# Patient Record
Sex: Male | Born: 1965
Health system: Southern US, Community
[De-identification: ages and names within clinical notes are randomized; demographics above are authoritative.]

## PROBLEM LIST (undated history)

## (undated) DIAGNOSIS — E669 Obesity, unspecified: Secondary | ICD-10-CM

## (undated) DIAGNOSIS — D369 Benign neoplasm, unspecified site: Secondary | ICD-10-CM

## (undated) DIAGNOSIS — K760 Fatty (change of) liver, not elsewhere classified: Secondary | ICD-10-CM

## (undated) DIAGNOSIS — G4733 Obstructive sleep apnea (adult) (pediatric): Secondary | ICD-10-CM

## (undated) DIAGNOSIS — N200 Calculus of kidney: Secondary | ICD-10-CM

## (undated) DIAGNOSIS — K449 Diaphragmatic hernia without obstruction or gangrene: Secondary | ICD-10-CM

## (undated) DIAGNOSIS — R011 Cardiac murmur, unspecified: Secondary | ICD-10-CM

## (undated) DIAGNOSIS — E785 Hyperlipidemia, unspecified: Secondary | ICD-10-CM

## (undated) DIAGNOSIS — U071 COVID-19: Secondary | ICD-10-CM

## (undated) DIAGNOSIS — I2510 Atherosclerotic heart disease of native coronary artery without angina pectoris: Secondary | ICD-10-CM

## (undated) DIAGNOSIS — N289 Disorder of kidney and ureter, unspecified: Secondary | ICD-10-CM

## (undated) DIAGNOSIS — I10 Essential (primary) hypertension: Secondary | ICD-10-CM

## (undated) DIAGNOSIS — M67919 Unspecified disorder of synovium and tendon, unspecified shoulder: Secondary | ICD-10-CM

## (undated) DIAGNOSIS — J45909 Unspecified asthma, uncomplicated: Secondary | ICD-10-CM

## (undated) DIAGNOSIS — R7989 Other specified abnormal findings of blood chemistry: Secondary | ICD-10-CM

## (undated) HISTORY — DX: Atherosclerotic heart disease of native coronary artery without angina pectoris: I25.10

## (undated) HISTORY — DX: Other specified abnormal findings of blood chemistry: R79.89

## (undated) HISTORY — DX: Calculus of kidney: N20.0

## (undated) HISTORY — DX: Diaphragmatic hernia without obstruction or gangrene: K44.9

## (undated) HISTORY — DX: Fatty (change of) liver, not elsewhere classified: K76.0

## (undated) HISTORY — DX: Unspecified asthma, uncomplicated: J45.909

## (undated) HISTORY — DX: Cardiac murmur, unspecified: R01.1

## (undated) HISTORY — DX: Hyperlipidemia, unspecified: E78.5

## (undated) HISTORY — DX: Unspecified disorder of synovium and tendon, unspecified shoulder: M67.919

## (undated) HISTORY — DX: Obesity, unspecified: E66.9

## (undated) HISTORY — DX: Obstructive sleep apnea (adult) (pediatric): G47.33

## (undated) HISTORY — DX: Benign neoplasm, unspecified site: D36.9

---
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
## 2000-05-30 ENCOUNTER — Other Ambulatory Visit: Admission: RE | Admit: 2000-05-30 | Discharge: 2000-05-30 | Payer: Self-pay | Admitting: Urology

## 2000-05-30 ENCOUNTER — Encounter (INDEPENDENT_AMBULATORY_CARE_PROVIDER_SITE_OTHER): Payer: Self-pay | Admitting: Specialist

## 2001-06-25 ENCOUNTER — Ambulatory Visit (HOSPITAL_COMMUNITY): Admission: RE | Admit: 2001-06-25 | Discharge: 2001-06-25 | Payer: Self-pay | Admitting: Gastroenterology

## 2001-07-03 ENCOUNTER — Encounter: Payer: Self-pay | Admitting: Gastroenterology

## 2001-07-03 ENCOUNTER — Ambulatory Visit (HOSPITAL_COMMUNITY): Admission: RE | Admit: 2001-07-03 | Discharge: 2001-07-03 | Payer: Self-pay | Admitting: Gastroenterology

## 2001-07-07 ENCOUNTER — Ambulatory Visit (HOSPITAL_COMMUNITY): Admission: RE | Admit: 2001-07-07 | Discharge: 2001-07-07 | Payer: Self-pay | Admitting: Gastroenterology

## 2001-07-07 ENCOUNTER — Encounter: Payer: Self-pay | Admitting: Gastroenterology

## 2001-08-29 ENCOUNTER — Ambulatory Visit (HOSPITAL_COMMUNITY): Admission: RE | Admit: 2001-08-29 | Discharge: 2001-08-29 | Payer: Self-pay | Admitting: Gastroenterology

## 2002-07-09 ENCOUNTER — Emergency Department (HOSPITAL_COMMUNITY): Admission: EM | Admit: 2002-07-09 | Discharge: 2002-07-10 | Payer: Self-pay

## 2002-10-07 ENCOUNTER — Ambulatory Visit (HOSPITAL_COMMUNITY): Admission: RE | Admit: 2002-10-07 | Discharge: 2002-10-07 | Payer: Self-pay | Admitting: Gastroenterology

## 2002-10-07 ENCOUNTER — Encounter (INDEPENDENT_AMBULATORY_CARE_PROVIDER_SITE_OTHER): Payer: Self-pay | Admitting: Specialist

## 2002-10-07 ENCOUNTER — Encounter: Payer: Self-pay | Admitting: Gastroenterology

## 2005-08-21 ENCOUNTER — Emergency Department (HOSPITAL_COMMUNITY): Admission: EM | Admit: 2005-08-21 | Discharge: 2005-08-22 | Payer: Self-pay | Admitting: Emergency Medicine

## 2009-06-30 ENCOUNTER — Emergency Department (HOSPITAL_COMMUNITY): Admission: EM | Admit: 2009-06-30 | Discharge: 2009-06-30 | Payer: Self-pay | Admitting: Emergency Medicine

## 2010-10-22 ENCOUNTER — Emergency Department (HOSPITAL_COMMUNITY)
Admission: EM | Admit: 2010-10-22 | Discharge: 2010-10-22 | Disposition: A | Discharge status: Home / Self Care | Payer: Commercial Managed Care - PPO | Attending: Emergency Medicine | Admitting: Emergency Medicine

## 2010-10-22 ENCOUNTER — Emergency Department (HOSPITAL_COMMUNITY): Payer: Commercial Managed Care - PPO

## 2010-10-22 DIAGNOSIS — W1809XA Striking against other object with subsequent fall, initial encounter: Secondary | ICD-10-CM | POA: Insufficient documentation

## 2010-10-22 DIAGNOSIS — S20219A Contusion of unspecified front wall of thorax, initial encounter: Secondary | ICD-10-CM | POA: Insufficient documentation

## 2010-10-22 DIAGNOSIS — Y92009 Unspecified place in unspecified non-institutional (private) residence as the place of occurrence of the external cause: Secondary | ICD-10-CM | POA: Insufficient documentation

## 2010-11-15 LAB — POCT RAPID STREP A (OFFICE): Streptococcus, Group A Screen (Direct): NEGATIVE

## 2010-12-29 NOTE — Procedures (Signed)
[facility]  Patient:    [name], [name] Visit Number: [id number] MRN: [id number]          Service Type: END Location: ENDO Attending Physician:  Louie Bun Dictated by:   Everardo All Madilyn Fireman, M.D. Proc. Date: 06/25/01 Admit Date:  06/25/2001   CC:         Jerl Santos, M.D.   Procedure Report  PROCEDURE:  Esophagogastroduodenoscopy.  ENDOSCOPIST:  Everardo All. Madilyn Fireman, M.D.  INDICATIONS:  Gastroesophageal reflux disease with some atypical features with failure to respond to Nexium over two weeks.  DESCRIPTION OF PROCEDURE:  The patient was placed in the left lateral decubitus position and placed on the pulse monitor with continuous low-flow oxygen delivered via nasal cannula.  He was sedated with 80 mg of IV Demerol and 8 mg IV Versed.  The Olympus video endoscope was advanced into the oropharynx and esophagus.  The esophagus was straight and of normal caliber with the squamocolumnar at 38 cm.  There was no visible hiatal hernia, ring, stricture, or other abnormality of the GE junction.  The stomach was entered and a small amount of liquid secretions were suctioned from the fundus. Retroflexed view of the cardia was unremarkable.  The fundus and body appeared normal.  The antrum showed two to three elevated areas of erythema with slight central dimpling and granularity consistent with focal moderate antral gastritis.  No ulcer or crater were seen.  The abdomen and pylorus was not significantly deformed.  The duodenum was entered and both bulb and second portion were well inspected and appeared to be within normal limits.  The scope was then withdrawn back into the stomach and a CLOtest obtained.  The scope was then withdrawn, and the patient returned to the recovery room in stable condition.  He tolerated the procedure well and there were no immediate complications.  IMPRESSION:  Antral gastritis, otherwise normal endoscopy.  PLAN:  Will await  CLOtest and consider gallbladder ultrasound. Dictated by:   Everardo All Madilyn Fireman, M.D. Attending Physician:  Louie Bun DD:  06/25/01 TD:  06/25/01 Job: [id number]

## 2010-12-29 NOTE — Procedures (Signed)
[facility]  Patient:    [name], BRANDER Visit Number: [id number] MRN: [id number]          Service Type: END Location: ENDO Attending Physician:  Louie Bun Dictated by:   Everardo All Madilyn Fireman, M.D. Proc. Date: 08/29/01 Admit Date:  08/29/2001 Discharge Date: 08/29/2001   CC:         Jerl Santos, M.D.   Procedure Report  PROCEDURE:  Colonoscopy.  INDICATION FOR PROCEDURE:  Family history of colon cancer in a first-degree relative in addition to ongoing abdominal pain of unclear etiology with negative response to empiric trials and negative work-up to date.  DESCRIPTION OF PROCEDURE:  The patient was placed in the left lateral decubitus position and placed on the pulse monitor with continuous low-flow oxygen delivered by nasal cannula.  He was sedated with 80 mg of IV Demerol and 8 mg of IV Versed.  The Olympus video colonoscope was inserted into the rectum and advanced to the cecum, confirmed by visualization of the ileocecal valve and transillumination at McBurneys point. However, despite multiple torquing maneuvers, abdominal pressure, and position changes, I was unable to advance the scope beyond the ileocecal valve, and I could not see all of the base of the cecum and, in other areas, I was only able to see in a limited fashion.  I could thus not rule out deep lesions in the cecum.  Other than that, the visualized parts and portions of the cecum as well as the ascending, transverse, descending, and sigmoid colon as well as the rectum appeared normal with no masses, polyps, diverticula, or other mucosal abnormalities. The scope was then withdrawn, and the patient returned to the recovery room in stable condition.  He tolerated the procedure well, and there were no immediate complications.  IMPRESSION:  Normal colonoscopy with a limited view of the cecum.  PLAN:  Will continue Nexium for now which seems to be helping some of  his gastrointestinal symptoms.  Follow-up colonoscopy in five years. Dictated by:   Everardo All Madilyn Fireman, M.D. Attending Physician:  Louie Bun DD:  08/29/01 TD:  08/31/01 Job: [id number]

## 2011-01-04 ENCOUNTER — Observation Stay (HOSPITAL_COMMUNITY)
Admission: EM | Admit: 2011-01-04 | Discharge: 2011-01-05 | Disposition: A | Discharge status: Home / Self Care | Payer: 59 | Source: Ambulatory Visit | Attending: Internal Medicine | Admitting: Internal Medicine

## 2011-01-04 ENCOUNTER — Emergency Department (HOSPITAL_COMMUNITY): Payer: 59

## 2011-01-04 DIAGNOSIS — E785 Hyperlipidemia, unspecified: Secondary | ICD-10-CM | POA: Insufficient documentation

## 2011-01-04 DIAGNOSIS — E669 Obesity, unspecified: Secondary | ICD-10-CM | POA: Insufficient documentation

## 2011-01-04 DIAGNOSIS — K219 Gastro-esophageal reflux disease without esophagitis: Secondary | ICD-10-CM | POA: Insufficient documentation

## 2011-01-04 DIAGNOSIS — I10 Essential (primary) hypertension: Secondary | ICD-10-CM | POA: Insufficient documentation

## 2011-01-04 DIAGNOSIS — R079 Chest pain, unspecified: Principal | ICD-10-CM | POA: Insufficient documentation

## 2011-01-04 LAB — COMPREHENSIVE METABOLIC PANEL
ALT: 53 U/L (ref 0–53)
Calcium: 9.5 mg/dL (ref 8.4–10.5)
Creatinine, Ser: 0.93 mg/dL (ref 0.4–1.5)
Glucose, Bld: 105 mg/dL — ABNORMAL HIGH (ref 70–99)
Sodium: 136 mEq/L (ref 135–145)
Total Protein: 7.4 g/dL (ref 6.0–8.3)

## 2011-01-04 LAB — CK TOTAL AND CKMB (NOT AT ARMC)
CK, MB: 4.2 ng/mL — ABNORMAL HIGH (ref 0.3–4.0)
Relative Index: 1.1 (ref 0.0–2.5)
Total CK: 384 U/L — ABNORMAL HIGH (ref 7–232)

## 2011-01-04 LAB — CARDIAC PANEL(CRET KIN+CKTOT+MB+TROPI)
CK, MB: 3.8 ng/mL (ref 0.3–4.0)
CK, MB: 3.5 ng/mL (ref 0.3–4.0)
Relative Index: 1 (ref 0.0–2.5)
Total CK: 334 U/L — ABNORMAL HIGH (ref 7–232)
Troponin I: <0.3 ng/mL (ref <0.30)

## 2011-01-04 LAB — CBC
HCT: 44.8 % (ref 39.0–52.0)
Hemoglobin: 16 g/dL (ref 13.0–17.0)
RDW: 13.4 % (ref 11.5–15.5)
WBC: 6.1 10*3/uL (ref 4.0–10.5)

## 2011-01-04 LAB — POCT CARDIAC MARKERS
CKMB, poc: 2.3 ng/mL (ref 1.0–8.0)
Myoglobin, poc: 120 ng/mL (ref 12–200)

## 2011-01-04 LAB — RAPID URINE DRUG SCREEN, HOSP PERFORMED
Cocaine: NOT DETECTED
Tetrahydrocannabinol: NOT DETECTED

## 2011-01-05 ENCOUNTER — Observation Stay (HOSPITAL_COMMUNITY): Payer: 59

## 2011-01-05 LAB — HEMOGLOBIN A1C
Hgb A1c MFr Bld: 5.5 % (ref <5.7)
Mean Plasma Glucose: 111 mg/dL (ref <117)

## 2011-01-05 LAB — CARDIAC PANEL(CRET KIN+CKTOT+MB+TROPI)
CK, MB: 3.1 ng/mL (ref 0.3–4.0)
Relative Index: 1.1 (ref 0.0–2.5)
Total CK: 278 U/L — ABNORMAL HIGH (ref 7–232)
Troponin I: <0.3 ng/mL (ref <0.30)

## 2011-01-05 LAB — BASIC METABOLIC PANEL
Calcium: 8.8 mg/dL (ref 8.4–10.5)
GFR calc non Af Amer: >60 mL/min (ref >60)
Glucose, Bld: 104 mg/dL — ABNORMAL HIGH (ref 70–99)
Sodium: 136 mEq/L (ref 135–145)

## 2011-01-05 LAB — CBC
HCT: 41.7 % (ref 39.0–52.0)
MCHC: 35 g/dL (ref 30.0–36.0)
Platelets: 152 10*3/uL (ref 150–400)
RDW: 13.2 % (ref 11.5–15.5)

## 2011-01-05 LAB — LIPID PANEL: Triglycerides: 159 mg/dL — ABNORMAL HIGH (ref <150)

## 2011-01-05 MED ORDER — TECHNETIUM TC 99M TETROFOSMIN IV KIT
10.0000 | PACK | Freq: Once | INTRAVENOUS | Status: AC | PRN
  Administered 2011-01-05: 10 via INTRAVENOUS

## 2011-01-05 MED ORDER — TECHNETIUM TC 99M TETROFOSMIN IV KIT
30.0000 | PACK | Freq: Once | INTRAVENOUS | Status: AC | PRN
  Administered 2011-01-05: 30 via INTRAVENOUS

## 2011-01-05 NOTE — Consult Note (Signed)
NAME:  [name], [name]                 ACCOUNT NO.:  [id number]  MEDICAL RECORD NO.:  [id number]           PATIENT TYPE:  O  LOCATION:  2009                         FACILITY:  [facility]  PHYSICIAN:  Lyn Records, M.D.   DATE OF BIRTH:  [birth year]  DATE OF CONSULTATION:  01/04/2011 DATE OF DISCHARGE:                                CONSULTATION   INDICATION FOR ADMISSION:  Prolonged chest pain.  CONCLUSIONS: 1. Prolonged chest pain with some typical features.  Rule out     myocardial infarction versus unstable angina.  Rule out GI. 2. Hypertension. 3. Hyperlipidemia. 4. Gastroesophageal reflux.  RECOMMENDATIONS: 1. Serial markers. 2. Lipid panel. 3. EKG in a.m. 4. Can versus outpatient stress nuclear study assuming markers and EKG     is remain normal. 5. Lovenox until the patient rules out.  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  The discomfort did not radiate and there was no shortness of breath or diaphoresis. The discomfort was severe enough that he came to the emergency room where a GI cocktail and two nitroglycerin tablets were given.  After combined duration of approximately 2 hours, the discomfort resolved and has not recurred.  The patient is relatively active and has never experienced this type of discomfort before.  He feels that this was a severe episode of heartburn.  He remains relatively active without limiting symptoms prior to this admission.  His significant medical problems include gastroesophageal reflux, hypertension, and elevated, but untreated lipids that are "not that bad."  FAMILY HISTORY:  Significant for father died at age 92 of heart failure from previous infarcts.  His first MI was in his mid-to-late 30s.  His mother is living.  HABITS:  He does not smoke.  He drinks beer usually on  weekends.  MEDICATIONS:  Nexium 40 mg daily at bedtime.  ALLERGIES:  PENICILLIN.  PHYSICAL EXAMINATION:  GENERAL:  The patient is obese.  He is in no distress. VITAL SIGNS:  Blood pressure is 130/70, heart rate is 60. NECK:  No carotid bruits or JVD is noted with the patient sitting in 90 degrees. LUNGS:  Clear to auscultation and percussion. CARDIAC:  Normal.  No rub, no click, no gallop is heard. ABDOMEN:  Soft.  The liver and spleen are not enlarged. EXTREMITIES:  Reveal no edema.  Pulses are 2+ and symmetric in upper and lower extremities. NEUROLOGIC:  Grossly intact.  EKG is normal.  Laboratory data is all normal.  Chest x-ray is unremarkable.  First set of cardiac markers revealed a CK-MB of 4.2. Second set is normal.  Troponins have been normal.  DISCUSSION:  The patient's history has a qualitative feature suggestive of ischemia, although there is no objective evidence to this point.  He should be on antithrombotic therapy.  He should be rule out with three sets of markers.  Lipid panel should also be obtained.  Despite his markers remain negative and EKGs did not reveal evidence of ischemia, a stress nuclear  study should be considered either inpatient or outpatient depending upon which can be done most expediently.     Lyn Records, M.D.     HWS/MEDQ  D:  01/04/2011  T:  01/05/2011  Job:  [id number]  cc:   Thora Lance, M.D.  Electronically Signed by Verdis Prime M.D. on 01/05/2011 01:39:17 PM

## 2011-01-06 ENCOUNTER — Other Ambulatory Visit (HOSPITAL_COMMUNITY): Payer: 59

## 2011-01-18 NOTE — Discharge Summary (Signed)
  NAME:  [name], [name]                 ACCOUNT NO.:  [id number]  MEDICAL RECORD NO.:  [id number]           PATIENT TYPE:  O  LOCATION:  2009                         FACILITY:  [facility]  PHYSICIAN:  Thora Lance, M.D.  DATE OF BIRTH:  Feb 03, 1966  DATE OF ADMISSION:  01/04/2011 DATE OF DISCHARGE:  01/05/2011                              DISCHARGE SUMMARY   REASON FOR ADMISSION:  This is a 45 year old white male with a history of hypertension and reflux who presented with substernal squeezing chest pain on day of admission.  This was nonradiating.  The patient did get nitroglycerin at the ER and the pain resolved.  Strong family history of coronary artery disease.  SIGNIFICANT FINDINGS:  Blood pressure 128/85, heart rate 57, respirations 14, temperature 97.3.  Lungs were clear.  Heart regular rate and rhythm without murmur, gallop or rub.  Abdomen benign.  LABORATORY:  Sodium 136, potassium 3.7, chloride 99, bicarbonate 25, BUN 15, creatinine 0.93, glucose 105.  WBC 6.1, hemoglobin 16.0, platelets 102.  Chest x-ray, no acute disease.  Point of care markers negative. EKG, normal sinus rhythm without acute changes.  HOSPITAL COURSE:  The patient was admitted for chest pain.  He was ruled out for an MI.  He was placed on a PPI and aspirin.  He was seen by [facility].  A 2-D echocardiogram was done that showed mild concentric LVH, normal systolic function.  A nuclear stress test was done on the second hospital day and showed no inducible or reversible ischemia, no fixed defects.  The patient was discharged in good condition for outpatient followup  DISCHARGE DIAGNOSES: 1. Chest pain. 2. Hypertension. 3. Gastroesophageal reflux disease.  PROCEDURES: 1. 2-D echocardiogram. 2. Stress Cardiolite.  DISCHARGE MEDICATIONS: 1. Losartan HCT 50/12.5 mg once a day. 2. Nexium 40 mg once a day. 3. Nasonex 2 sprays each nostril daily  DISPOSITION:  Discharged to home.  Follow up in 2  weeks with Dr. Valentina Lucks.  Activity as tolerated.  CODE STATUS:  Full code.          ______________________________ Thora Lance, M.D.     JJG/MEDQ  D:  01/13/2011  T:  01/13/2011  Job:  [id number]  Electronically Signed by Kirby Funk M.D. on 01/18/2011 06:13:26 PM

## 2011-01-31 NOTE — H&P (Signed)
NAMECRISTIN, [name]                 ACCOUNT NO.:  [id number]  MEDICAL RECORD NO.:  [id number]           PATIENT TYPE:  O  LOCATION:  2009                         FACILITY:  [facility]  PHYSICIAN:  [name], M.D. DATE OF BIRTH:  Jan 03, 1966  DATE OF ADMISSION:  01/04/2011 DATE OF DISCHARGE:                             HISTORY & PHYSICAL   PRIMARY CARE PHYSICIAN:  [name], M.D.  CHIEF COMPLAINT:  Chest pain.  HISTORY OF PRESENT ILLNESS:  [name] is of 45 year old obese Caucasian gentleman with a history of hypertension and GERD who works as a Microbiologist for [employer].  He was at work today doing physical labor when he suddenly experienced the onset of substernal squeezing chest pain.  This chest pain did not radiate.  His coworkers talked to him to come into the Emergency Department for evaluation.  Chest pain also resolved after he received two doses of nitroglycerin and a GI cocktail in the Emergency Department.  He has a very strong family history for heart disease with his father having an MI at age of 44 and subsequently died at age 24 from heart disease and hence the ED has asked [place] to admit him for further evaluation.  ALLERGIES:  He has allergies to PENICILLIN which cause hives and itching.  PAST MEDICAL HISTORY:  Significant for hypertension and GERD.  HOME MEDICATIONS:  Nexium 40 mg at bedtime as well as some blood pressure medication that he does not know the name of at this time.  SOCIAL HISTORY:  He denies any tobacco or illicit drug use.  He says he does drink socially, maybe 4 or 5 beers over the weekend.  FAMILY HISTORY:  Significant for father who had his first heart attack at age 75 and subsequently expired with congestive heart failure at age 55.  REVIEW OF SYSTEMS:  Negative except as mentioned in history of present illness.  PHYSICAL EXAMINATION:  VITAL SIGNS:  On admission blood pressure 128/85, heart rate 57, respirations 14, sats  97% on room air, and a temp of 97.3. GENERAL:  He is alert, awake, and oriented x3 in no acute distress. HEENT: Normocephalic, atraumatic.  His pupils are equal, round, and reactive to light and accommodation with intact extraocular movements. NECK:  Supple.  No JVD, no lymphadenopathy, no bruits, no goiter. HEART:  Regular rate and rhythm.  I cannot auscultate any murmurs, rubs, or gallops. LUNGS:  Clear to auscultation bilaterally. ABDOMEN:  Obese, soft, nontender, nondistended with positive bowel sounds. EXTREMITIES:  No clubbing, cyanosis, or edema with positive pulses. NEUROLOGIC:  Grossly intact and nonfocal.  LABORATORY DATA:  On admission sodium 136, potassium 3.7, chloride 99, bicarb 25, BUN 15, creatinine 0.93, and glucose of 105.  WBC 6.1, hemoglobin 16.0, and platelets of 102.  First set of point of care markers is negative.  A chest x-ray that shows no acute disease.  EKG in the Emergency Department was reported to me as being without any acute ischemic changes.  However, EKG is not currently available in the chart as the patient has already moved up to the floor.  ASSESSMENT  AND PLAN: 1. A chest pain.  Sounds quite typical for heart disease.  Given his     strong family history and his personal history including obesity     and hypertension, I believe that it is very prudent to admit him to     the hospital to rule him out.  If he indeed rules out, I believe     that an inpatient stress test would certainly be indicated.  I have     already consulted [facility] whom he has seen in the past.     He states that he had a stress test about 6 years ago that was     reported to him as being normal or I do not have access to those     records at this time.  I will also place him on telemetry, I will     rule him out by ordering serial enzymes and EKGs.  Also another EKG     stat to be reported to me given the fact that I cannot find his     initial EKG.  I will start  him on aspirin.  I will withhold beta-     blocker at this time given the fact that he is somewhat bradycardic     at 58. 2. For his hypertension, it is currently well controlled.  I will     await pharmacy to provide me with reconciliation to reorder his     home medications. 3. For his gastroesophageal reflux disease, I will place him on     Protonix while in the hospital. 4. For a DVT prophylaxis.  I will place him on Lovenox.     [name], M.D.     EH/MEDQ  D:  01/04/2011  T:  01/04/2011  Job:  [id number]  cc:   [name], M.D.  Electronically Signed by [name] M.D. on 01/31/2011 07:00:25 AM

## 2012-07-17 ENCOUNTER — Encounter (HOSPITAL_COMMUNITY): Payer: Self-pay | Admitting: Emergency Medicine

## 2012-07-17 ENCOUNTER — Emergency Department (HOSPITAL_COMMUNITY)
Admission: EM | Admit: 2012-07-17 | Discharge: 2012-07-18 | Disposition: A | Discharge status: Home / Self Care | Payer: 59 | Attending: Emergency Medicine | Admitting: Emergency Medicine

## 2012-07-17 DIAGNOSIS — Z79899 Other long term (current) drug therapy: Secondary | ICD-10-CM | POA: Insufficient documentation

## 2012-07-17 DIAGNOSIS — I10 Essential (primary) hypertension: Secondary | ICD-10-CM | POA: Insufficient documentation

## 2012-07-17 DIAGNOSIS — R51 Headache: Secondary | ICD-10-CM | POA: Insufficient documentation

## 2012-07-17 DIAGNOSIS — H109 Unspecified conjunctivitis: Secondary | ICD-10-CM

## 2012-07-17 HISTORY — DX: Essential (primary) hypertension: I10

## 2012-07-17 MED ORDER — ONDANSETRON HCL 4 MG PO TABS
4.0000 mg | ORAL_TABLET | Freq: Once | ORAL | Status: AC
  Administered 2012-07-17: 4 mg via ORAL
  Filled 2012-07-17: qty 1

## 2012-07-17 MED ORDER — OXYCODONE-ACETAMINOPHEN 5-325 MG PO TABS
1.0000 | ORAL_TABLET | Freq: Once | ORAL | Status: AC
  Administered 2012-07-17: 1 via ORAL
  Filled 2012-07-17: qty 1

## 2012-07-17 MED ORDER — FLUORESCEIN SODIUM 1 MG OP STRP
ORAL_STRIP | OPHTHALMIC | Status: AC
  Administered 2012-07-18: 1
  Filled 2012-07-17: qty 1

## 2012-07-17 MED ORDER — TETRACAINE HCL 0.5 % OP SOLN
2.0000 [drp] | Freq: Once | OPHTHALMIC | Status: AC
  Administered 2012-07-18: 2 [drp] via OPHTHALMIC
  Filled 2012-07-17: qty 2

## 2012-07-17 NOTE — ED Notes (Signed)
Pt states he woke up with pain and redness to the right eye. States he does not remember getting anything in his eye. Right eye is red and watering, vision bilat is 20/20 on triage.

## 2012-07-18 MED ORDER — DICLOFENAC SODIUM 75 MG PO TBEC: 75.0000 mg | DELAYED_RELEASE_TABLET | Freq: Two times a day (BID) | ORAL | Status: AC

## 2012-07-18 MED ORDER — OXYCODONE-ACETAMINOPHEN 5-325 MG PO TABS: 1.0000 | ORAL_TABLET | Freq: Four times a day (QID) | ORAL | Status: AC | PRN

## 2012-07-18 MED ORDER — TOBRAMYCIN 0.3 % OP SOLN
2.0000 [drp] | OPHTHALMIC | Status: DC
  Administered 2012-07-18: 2 [drp] via OPHTHALMIC
  Filled 2012-07-18: qty 5

## 2012-07-19 NOTE — ED Provider Notes (Signed)
History     CSN: [id number]  Arrival date & time 07/17/12  2257   First MD Initiated Contact with Patient 07/17/12 2309      Chief Complaint  Patient presents with  . Eye Pain    (Consider location/radiation/quality/duration/timing/severity/associated sxs/prior treatment) Patient is a 46 y.o. male presenting with eye pain. The history is provided by the patient.  Eye Pain This is a new problem. The current episode started today. The problem occurs constantly. The problem has been gradually worsening. Associated symptoms include headaches. Pertinent negatives include no abdominal pain, arthralgias, chest pain, coughing or neck pain. Exacerbated by: bright light  He has tried nothing for the symptoms. The treatment provided no relief.    Past Medical History  Diagnosis Date  . Hypertension     History reviewed. No pertinent past surgical history.  History reviewed. No pertinent family history.  History  Substance Use Topics  . Smoking status: Never Smoker   . Smokeless tobacco: Not on file  . Alcohol Use: No      Review of Systems  Constitutional: Negative for activity change.       All ROS Neg except as noted in HPI  HENT: Negative for nosebleeds and neck pain.   Eyes: Positive for pain. Negative for photophobia and discharge.  Respiratory: Negative for cough, shortness of breath and wheezing.   Cardiovascular: Negative for chest pain and palpitations.  Gastrointestinal: Negative for abdominal pain and blood in stool.  Genitourinary: Negative for dysuria, frequency and hematuria.  Musculoskeletal: Negative for back pain and arthralgias.  Skin: Negative.   Neurological: Positive for headaches. Negative for dizziness, seizures and speech difficulty.  Psychiatric/Behavioral: Negative for hallucinations and confusion.    Allergies  Neosporin and Penicillins  Home Medications   Current Outpatient Rx  Name  Route  Sig  Dispense  Refill  .  BENAZEPRIL-HYDROCHLOROTHIAZIDE 10-12.5 MG PO TABS   Oral   Take 1 tablet by mouth daily.         Marland Kitchen ESOMEPRAZOLE MAGNESIUM 20 MG PO CPDR   Oral   Take 20 mg by mouth daily before breakfast.         . DICLOFENAC SODIUM 75 MG PO TBEC   Oral   Take 1 tablet (75 mg total) by mouth 2 (two) times daily.   12 tablet   0   . OXYCODONE-ACETAMINOPHEN 5-325 MG PO TABS   Oral   Take 1 tablet by mouth every 6 (six) hours as needed for pain.   20 tablet   0     BP 141/79  Pulse 71  Temp 98.3 F (36.8 C) (Oral)  Resp 18  Ht 5\' 9"  (1.753 m)  Wt 255 lb (115.667 kg)  BMI 37.66 kg/m2  SpO2 97%  Physical Exam  Nursing note and vitals reviewed. Constitutional: He is oriented to person, place, and time. He appears well-developed and well-nourished.  Non-toxic appearance.  HENT:  Head: Normocephalic.  Right Ear: Tympanic membrane and external ear normal.  Left Ear: Tympanic membrane and external ear normal.  Eyes: EOM are normal. Pupils are equal, round, and reactive to light. Right eye exhibits discharge. Right eye exhibits no hordeolum. No foreign body present in the right eye. Right conjunctiva is injected. Right conjunctiva has no hemorrhage. No scleral icterus.  Fundoscopic exam:      The right eye shows no AV nicking, no exudate, no hemorrhage and no papilledema.       The left eye shows no AV nicking,  no exudate, no hemorrhage and no papilledema.  Slit lamp exam:      The right eye shows no corneal abrasion, no corneal ulcer, no foreign body and no hyphema.  Neck: Normal range of motion. Neck supple. Carotid bruit is not present.  Cardiovascular: Normal rate, regular rhythm, normal heart sounds, intact distal pulses and normal pulses.   Pulmonary/Chest: Breath sounds normal. No respiratory distress.  Abdominal: Soft. Bowel sounds are normal. There is no tenderness. There is no guarding.  Musculoskeletal: Normal range of motion.  Lymphadenopathy:       Head (right side): No  submandibular adenopathy present.       Head (left side): No submandibular adenopathy present.    He has no cervical adenopathy.  Neurological: He is alert and oriented to person, place, and time. He has normal strength. No cranial nerve deficit or sensory deficit.  Skin: Skin is warm and dry.  Psychiatric: He has a normal mood and affect. His speech is normal.    ED Course  Procedures (including critical care time)  Labs Reviewed - No data to display No results found.   1. Conjunctivitis       MDM  I have reviewed nursing notes, vital signs, and all appropriate lab and imaging results for this patient. Patient awakened with pain and redness of the right eye. His been no injury to the eye. The patient denies any welding, grinding, or in an atmosphere of flying objects or debris. Exam is consistent with conjunctivitis. The plan is for use of cool compresses, tobramycin eyedrops every 4 hours in Norco for pain not resolved by Tylenol or ibuprofen. Patient is to see the eye specialist if not improving or return to the emergency department.       Kathie Dike, [place] 07/19/12 2242

## 2012-07-21 NOTE — ED Provider Notes (Signed)
Medical screening examination/treatment/procedure(s) were performed by non-physician practitioner and as supervising physician I was immediately available for consultation/collaboration.  Donnetta Hutching, MD 07/21/12 [phone]

## 2013-10-16 ENCOUNTER — Ambulatory Visit (INDEPENDENT_AMBULATORY_CARE_PROVIDER_SITE_OTHER): Payer: 59 | Admitting: Pulmonary Disease

## 2013-10-16 ENCOUNTER — Encounter: Payer: Self-pay | Admitting: Pulmonary Disease

## 2013-10-16 ENCOUNTER — Ambulatory Visit (INDEPENDENT_AMBULATORY_CARE_PROVIDER_SITE_OTHER)
Admission: RE | Admit: 2013-10-16 | Discharge: 2013-10-16 | Disposition: A | Discharge status: Home / Self Care | Payer: 59 | Source: Ambulatory Visit | Attending: Pulmonary Disease | Admitting: Pulmonary Disease

## 2013-10-16 VITALS — BP 142/80 | HR 69 | Temp 97.8°F | Ht 70.0 in | Wt 264.4 lb

## 2013-10-16 DIAGNOSIS — R053 Chronic cough: Secondary | ICD-10-CM

## 2013-10-16 DIAGNOSIS — J45909 Unspecified asthma, uncomplicated: Secondary | ICD-10-CM

## 2013-10-16 DIAGNOSIS — R059 Cough, unspecified: Secondary | ICD-10-CM

## 2013-10-16 DIAGNOSIS — J45991 Cough variant asthma: Secondary | ICD-10-CM | POA: Insufficient documentation

## 2013-10-16 DIAGNOSIS — R05 Cough: Secondary | ICD-10-CM

## 2013-10-16 HISTORY — DX: Cough variant asthma: J45.991

## 2013-10-16 MED ORDER — PREDNISONE 10 MG PO TABS: ORAL_TABLET | ORAL | Status: DC

## 2013-10-16 MED ORDER — BENZONATATE 200 MG PO CAPS: 200.0000 mg | ORAL_CAPSULE | Freq: Three times a day (TID) | ORAL | Status: DC | PRN

## 2013-10-16 NOTE — Progress Notes (Signed)
Subjective:    Patient ID: [name], male    DOB: [birth year], 48 y.o.   MRN: [id number]  HPI  48 year old , smokeless tobacco user,  Presents for evaluation of chronic cough.   Chief Complaint  Patient presents with  . Advice Only    Pt c/o constant dry hacking cough. Thinks d/t BP medication    He reports a bronchitis episode in December 20 14 the was treated with antibiotics and steroids. His cough has persisted since then. He reports a deep nonproductive cough but occasionally wakes him up at night. The no environmental triggers, he works Biochemist, clinical. There is no shortness of breath or associated wheezing. He denies chest pain, orthopnea or paroxysmal nocturnal dyspnea. He takes daily Nexium and denies breakthrough reflux. He denies seasonal allergies or postnasal drip. He used to be an ACE inhibitor, this was changed to losartan about a year ago.  5/ 2012 2-D echocardiogram showed mild  concentric LVH, normal systolic function. A nuclear stress test showed no inducible or reversible ischemia, no fixed defects. He does have a history of obstructive sleep apnea and did not tolerate CPAP therapy, oral appliance was apparently not covered by insurance.  Spirometry today showed mild restriction with normal ratio,   FEV1 73% -2.87, FVC 3.59 -71%  CXR showed low volumes and no evidence of current pulmonary disease.  Past Medical History  Diagnosis Date  . Hypertension   . Asthma     Dr. Laurann Montana  . OSA (obstructive sleep apnea)     No past surgical history on file.  Allergies  Allergen Reactions  . Neosporin [Neomycin-Bacitracin Zn-Polymyx]   . Penicillins     History   Social History  . Marital Status: Married    Spouse Name: N/A    Number of Children: 0  . Years of Education: N/A   Occupational History  . install door frames    Social History Main Topics  . Smoking status: Former Smoker -- 1.00 packs/day for 1 years    Types: Cigarettes   Quit date: 08/14/1987  . Smokeless tobacco: Current User     Comment: dip  . Alcohol Use: Yes     Comment: 2/day  . Drug Use: No  . Sexual Activity: Not on file   Other Topics Concern  . Not on file   Social History Narrative  . No narrative on file    Family History  Problem Relation Age of Onset  . Hypertension Mother   . Colon cancer Mother   . Heart disease Father      Review of Systems  Constitutional: Negative for fever and unexpected weight change.  HENT: Positive for postnasal drip and sneezing. Negative for congestion, dental problem, ear pain, nosebleeds, rhinorrhea, sinus pressure, sore throat and trouble swallowing.   Eyes: Negative for redness and itching.  Respiratory: Positive for cough. Negative for chest tightness, shortness of breath and wheezing.   Cardiovascular: Negative for palpitations and leg swelling.  Gastrointestinal: Negative for nausea and vomiting.  Genitourinary: Negative for dysuria.  Musculoskeletal: Negative for joint swelling.  Skin: Negative for rash.  Neurological: Negative for headaches.  Hematological: Does not bruise/bleed easily.  Psychiatric/Behavioral: Negative for dysphoric mood. The patient is not nervous/anxious.        Objective:   Physical Exam  Gen. Pleasant, obese, in no distress, normal affect ENT - no lesions, no post nasal drip, class 2-3 airway Neck: No JVD, no thyromegaly, no carotid bruits Lungs: no use  of accessory muscles, no dullness to percussion, decreased without rales or rhonchi  Cardiovascular: Rhythm regular, heart sounds  normal, no murmurs or gallops, no peripheral edema Abdomen: soft and non-tender, no hepatosplenomegaly, BS normal. Musculoskeletal: No deformities, no cyanosis or clubbing Neuro:  alert, non focal, no tremors        Assessment & Plan:

## 2013-10-16 NOTE — Patient Instructions (Addendum)
Cough may be post bronchitic or related to reflux Lung function mildly decreased Prednisone 10 mg tabs  Take 2 tabs daily with food x 5ds, then 1 tab daily with food x 5ds then STOP DELSYM cough syrup 100ml thrice daily as needed Benzonatate perles 200 mg thrice daily  Stay on nexium CXR today

## 2013-10-16 NOTE — Assessment & Plan Note (Addendum)
Cough may be post bronchitic or related to reflux, he does seem to have had a viral syndrome in December 2014. Lung function mildly restricted, no evidence of airway obstruction Prednisone 10 mg tabs  Take 2 tabs daily with food x 5ds, then 1 tab daily with food x 5ds then STOP DELSYM cough syrup 70ml thrice daily as needed Benzonatate perles 200 mg thrice daily  Stay on nexium CXR today

## 2014-04-28 ENCOUNTER — Emergency Department (HOSPITAL_COMMUNITY)
Admission: EM | Admit: 2014-04-28 | Discharge: 2014-04-28 | Disposition: A | Discharge status: Home / Self Care | Payer: 59 | Attending: Emergency Medicine | Admitting: Emergency Medicine

## 2014-04-28 ENCOUNTER — Emergency Department (HOSPITAL_COMMUNITY): Payer: 59

## 2014-04-28 ENCOUNTER — Encounter (HOSPITAL_COMMUNITY): Payer: Self-pay | Admitting: Emergency Medicine

## 2014-04-28 DIAGNOSIS — Z87891 Personal history of nicotine dependence: Secondary | ICD-10-CM | POA: Diagnosis not present

## 2014-04-28 DIAGNOSIS — IMO0002 Reserved for concepts with insufficient information to code with codable children: Secondary | ICD-10-CM | POA: Diagnosis not present

## 2014-04-28 DIAGNOSIS — I10 Essential (primary) hypertension: Secondary | ICD-10-CM | POA: Insufficient documentation

## 2014-04-28 DIAGNOSIS — Z880 Allergy status to penicillin: Secondary | ICD-10-CM | POA: Insufficient documentation

## 2014-04-28 DIAGNOSIS — J45909 Unspecified asthma, uncomplicated: Secondary | ICD-10-CM | POA: Diagnosis not present

## 2014-04-28 DIAGNOSIS — N201 Calculus of ureter: Secondary | ICD-10-CM | POA: Insufficient documentation

## 2014-04-28 DIAGNOSIS — Z79899 Other long term (current) drug therapy: Secondary | ICD-10-CM | POA: Insufficient documentation

## 2014-04-28 DIAGNOSIS — R109 Unspecified abdominal pain: Secondary | ICD-10-CM | POA: Insufficient documentation

## 2014-04-28 DIAGNOSIS — R112 Nausea with vomiting, unspecified: Secondary | ICD-10-CM | POA: Insufficient documentation

## 2014-04-28 DIAGNOSIS — Z8669 Personal history of other diseases of the nervous system and sense organs: Secondary | ICD-10-CM | POA: Diagnosis not present

## 2014-04-28 LAB — URINALYSIS, ROUTINE W REFLEX MICROSCOPIC
BILIRUBIN URINE: NEGATIVE
Glucose, UA: NEGATIVE mg/dL
KETONES UR: NEGATIVE mg/dL
Leukocytes, UA: NEGATIVE
NITRITE: NEGATIVE
PROTEIN: 30 mg/dL — AB
SPECIFIC GRAVITY, URINE: 1.025 (ref 1.005–1.030)
UROBILINOGEN UA: 0.2 mg/dL (ref 0.0–1.0)
pH: 5.5 (ref 5.0–8.0)

## 2014-04-28 LAB — COMPREHENSIVE METABOLIC PANEL
ALK PHOS: 78 U/L (ref 39–117)
ALT: 130 U/L — AB (ref 0–53)
ANION GAP: 13 (ref 5–15)
AST: 87 U/L — ABNORMAL HIGH (ref 0–37)
Albumin: 4.1 g/dL (ref 3.5–5.2)
BUN: 13 mg/dL (ref 6–23)
CO2: 26 meq/L (ref 19–32)
Calcium: 9.3 mg/dL (ref 8.4–10.5)
Chloride: 99 mEq/L (ref 96–112)
Creatinine, Ser: 0.98 mg/dL (ref 0.50–1.35)
GLUCOSE: 164 mg/dL — AB (ref 70–99)
POTASSIUM: 4.1 meq/L (ref 3.7–5.3)
Sodium: 138 mEq/L (ref 137–147)
TOTAL PROTEIN: 7.2 g/dL (ref 6.0–8.3)
Total Bilirubin: 0.8 mg/dL (ref 0.3–1.2)

## 2014-04-28 LAB — CBC WITH DIFFERENTIAL/PLATELET
Basophils Absolute: 0 10*3/uL (ref 0.0–0.1)
Basophils Relative: 0 % (ref 0–1)
EOS ABS: 0.3 10*3/uL (ref 0.0–0.7)
Eosinophils Relative: 4 % (ref 0–5)
HCT: 44 % (ref 39.0–52.0)
HEMOGLOBIN: 15.5 g/dL (ref 13.0–17.0)
LYMPHS ABS: 2.5 10*3/uL (ref 0.7–4.0)
LYMPHS PCT: 32 % (ref 12–46)
MCH: 31 pg (ref 26.0–34.0)
MCHC: 35.2 g/dL (ref 30.0–36.0)
MCV: 88 fL (ref 78.0–100.0)
MONOS PCT: 6 % (ref 3–12)
Monocytes Absolute: 0.5 10*3/uL (ref 0.1–1.0)
NEUTROS PCT: 58 % (ref 43–77)
Neutro Abs: 4.4 10*3/uL (ref 1.7–7.7)
Platelets: 184 10*3/uL (ref 150–400)
RBC: 5 MIL/uL (ref 4.22–5.81)
RDW: 13 % (ref 11.5–15.5)
WBC: 7.7 10*3/uL (ref 4.0–10.5)

## 2014-04-28 LAB — URINE MICROSCOPIC-ADD ON

## 2014-04-28 MED ORDER — SODIUM CHLORIDE 0.9 % IV BOLUS (SEPSIS)
1000.0000 mL | Freq: Once | INTRAVENOUS | Status: AC
  Administered 2014-04-28: 1000 mL via INTRAVENOUS

## 2014-04-28 MED ORDER — OXYCODONE-ACETAMINOPHEN 5-325 MG PO TABS: 1.0000 | ORAL_TABLET | ORAL | Status: DC | PRN

## 2014-04-28 MED ORDER — KETOROLAC TROMETHAMINE 30 MG/ML IJ SOLN
30.0000 mg | Freq: Once | INTRAMUSCULAR | Status: AC
  Administered 2014-04-28: 30 mg via INTRAVENOUS
  Filled 2014-04-28: qty 1

## 2014-04-28 MED ORDER — ONDANSETRON HCL 4 MG/2ML IJ SOLN
4.0000 mg | Freq: Once | INTRAMUSCULAR | Status: AC
  Administered 2014-04-28: 4 mg via INTRAVENOUS
  Filled 2014-04-28: qty 2

## 2014-04-28 MED ORDER — HYDROMORPHONE HCL PF 1 MG/ML IJ SOLN
1.0000 mg | Freq: Once | INTRAMUSCULAR | Status: AC
  Administered 2014-04-28: 1 mg via INTRAVENOUS
  Filled 2014-04-28: qty 1

## 2014-04-28 MED ORDER — FENTANYL CITRATE 0.05 MG/ML IJ SOLN
100.0000 ug | Freq: Once | INTRAMUSCULAR | Status: AC
  Administered 2014-04-28: 100 ug via INTRAVENOUS
  Filled 2014-04-28: qty 2

## 2014-04-28 MED ORDER — ONDANSETRON 4 MG PO TBDP: 4.0000 mg | ORAL_TABLET | ORAL | Status: DC | PRN

## 2014-04-28 NOTE — ED Notes (Signed)
Pt. Vomited after receiving his Fentanyl.  Pt. Received nausea medication.

## 2014-04-28 NOTE — ED Notes (Signed)
Strainer given to pt

## 2014-04-28 NOTE — ED Provider Notes (Signed)
CSN: [id number]     Arrival date & time 04/28/14  0704 History   First MD Initiated Contact with Patient 04/28/14 [phone]     Chief Complaint  Patient presents with  . Flank Pain  . Abdominal Pain     (Consider location/radiation/quality/duration/timing/severity/associated sxs/prior Treatment) HPI 48 year old male presents with acute onset of right flank and back pain that radiates to his right abdomen. This started approximately one hour prior to arrival. No fevers or chills. He's been nauseous and dry heaving. He states he is unable to sit down because he can't get comfortable. Has never had this before. Never had a prior history of kidney stones or family history of kidney stones. Denies hematuria or dysuria. After this started he try to urinate prior to my arrival and stated he couldn't.  Past Medical History  Diagnosis Date  . Hypertension   . Asthma     Dr. Laurann Montana  . OSA (obstructive sleep apnea)    History reviewed. No pertinent past surgical history. Family History  Problem Relation Age of Onset  . Hypertension Mother   . Colon cancer Mother   . Heart disease Father    History  Substance Use Topics  . Smoking status: Former Smoker -- 1.00 packs/day for 1 years    Types: Cigarettes    Quit date: 08/14/1987  . Smokeless tobacco: Current User     Comment: dip  . Alcohol Use: Yes     Comment: 2/day    Review of Systems  Constitutional: Negative for fever.  Gastrointestinal: Positive for nausea and vomiting (dry heaving). Negative for abdominal pain.  Genitourinary: Positive for flank pain and difficulty urinating. Negative for dysuria and hematuria.  All other systems reviewed and are negative.     Allergies  Neosporin and Penicillins  Home Medications   Prior to Admission medications   Medication Sig Start Date End Date Taking? Authorizing Provider  albuterol (PROVENTIL HFA;VENTOLIN HFA) 108 (90 BASE) MCG/ACT inhaler Inhale 2 puffs into the lungs every 6  (six) hours as needed for wheezing or shortness of breath.    Historical Provider, MD  benzonatate (TESSALON) 200 MG capsule Take 1 capsule (200 mg total) by mouth 3 (three) times daily as needed for cough. 10/16/13   Rigoberto Noel, MD  esomeprazole (NEXIUM) 20 MG capsule Take 20 mg by mouth daily before breakfast.    Historical Provider, MD  losartan-hydrochlorothiazide (HYZAAR) 50-12.5 MG per tablet Take 1 tablet by mouth daily.    Historical Provider, MD  Multiple Vitamin (MULTIVITAMIN) tablet Take 1 tablet by mouth daily.    Historical Provider, MD  predniSONE (DELTASONE) 10 MG tablet Take 2 tabs daily x 5 days w/ food, 1 tab daily w/ food x 5 days then stop 10/16/13   Rigoberto Noel, MD  Triamcinolone Acetonide (NASACORT AQ NA) Place 1 spray into the nose daily.    Historical Provider, MD   BP 146/81  Pulse 71  Temp(Src) 97.9 F (36.6 C) (Oral)  Resp 24  SpO2 100% Physical Exam  Nursing note and vitals reviewed. Constitutional: He is oriented to person, place, and time. He appears well-developed and well-nourished.  Rolling around on bed, unable to get in comfortable position  HENT:  Head: Normocephalic and atraumatic.  Right Ear: External ear normal.  Left Ear: External ear normal.  Nose: Nose normal.  Eyes: Right eye exhibits no discharge. Left eye exhibits no discharge.  Neck: Neck supple.  Cardiovascular: Normal rate, regular rhythm, normal heart sounds and  intact distal pulses.   Pulmonary/Chest: Effort normal.  Abdominal: Soft. He exhibits no distension. There is no tenderness.  No CVA tenderness or tenderness over flank  Neurological: He is alert and oriented to person, place, and time.  Skin: Skin is warm. He is diaphoretic.    ED Course  Procedures (including critical care time) Labs Review Labs Reviewed  COMPREHENSIVE METABOLIC PANEL - Abnormal; Notable for the following:    Glucose, Bld 164 (*)    AST 87 (*)    ALT 130 (*)    All other components within normal  limits  URINALYSIS, ROUTINE W REFLEX MICROSCOPIC - Abnormal; Notable for the following:    Color, Urine AMBER (*)    APPearance TURBID (*)    Hgb urine dipstick MODERATE (*)    Protein, ur 30 (*)    All other components within normal limits  CBC WITH DIFFERENTIAL  URINE MICROSCOPIC-ADD ON    Imaging Review Ct Renal Stone Study  04/28/2014   CLINICAL DATA:  Right flank pain  EXAM: CT ABDOMEN AND PELVIS WITHOUT CONTRAST  TECHNIQUE: Multidetector CT imaging of the abdomen and pelvis was performed following the standard protocol without IV contrast.  COMPARISON:  None.  FINDINGS: Mild right basilar atelectasis.  No pericardial fluid.  Renal: There is mild hydronephrosis and hydroureter of the right renal collecting system. This secondary to a small obstructing calculus in the distal right ureter approximately 1 cm from the vesicoureteral junction. This right ureteral calculus measures 1 mm. There are 3 additional 2 mm calculi within the right kidney and a 2 mm calculus in the left kidney. No left ureterolithiasis  Low-attenuation liver suggests hepatic steatosis. The gallbladder, pancreas, spleen, adrenal glands normal.  The stomach, small bowel, appendix, colon are normal. The lower is normal caliber. Abdominal aorta is normal caliber. There is no retroperitoneal or periportal lymphadenopathy. No pelvic lymphadenopathy.  Prostate gland is normal.  No aggressive osseous lesion.  IMPRESSION: 1. Small obstructing calculus in distal right ureter. 2. Bilateral nephrolithiasis.   Electronically Signed   By: Suzy Bouchard M.D.   On: 04/28/2014 09:24     EKG Interpretation None      MDM   Final diagnoses:  Right ureteral stone    Patient's history and exam is consistent with a right ureteral stone. The patient's pain was able to be controlled after multiple IV pain medicines and was given fluids and antibiotics. He is now comfortable and well-appearing in the bed. He feels stable for discharge. He  has a small stone that should be passable on its own. He's not had any infectious symptoms and has no UTI or elevated white blood cell count. At this time he is stable for discharge with follow with an outpatient urologist.    Ephraim Hamburger, MD 04/28/14 [phone]

## 2014-04-28 NOTE — ED Notes (Signed)
Pt. Is aware of needing an urine specimen 

## 2014-04-28 NOTE — Discharge Instructions (Signed)

## 2014-04-28 NOTE — ED Notes (Signed)
Went into see pt. He was knealing on the floor by his own decision.This position helps with decreasing the pain..  Pt. Is covered in sweat , reports that he is having severe rt. Lower abdominal pain.  Called Dr. Regenia Skeeter into see pt.  He is going  Order another dose of pain medication.  Wiped pt.s with cool wet wash cloth.

## 2014-04-28 NOTE — ED Notes (Signed)
Pt SpO2-93%. Asked Pt if I could put him on a [place] to give his some Oxygen. Pt Refused. Pt still in Pain. Pt denied Shortness of Breath.

## 2014-04-28 NOTE — ED Notes (Signed)
Pt placed in [place]. Hooked up to BP & Pulse Ox. Pt refused to go on 5Lead.

## 2014-04-28 NOTE — ED Notes (Signed)
Pt reports onset approx 30 mins ago of right side back pain that radiates around to abd. Denies urinary symptoms.

## 2014-04-28 NOTE — ED Notes (Signed)
Pt. Took off his BP cuff.  Pt. Does not want to wear it.  Pt is kneeling on the floor.

## 2014-07-26 ENCOUNTER — Telehealth: Payer: Self-pay | Admitting: Internal Medicine

## 2014-07-26 ENCOUNTER — Encounter: Payer: Self-pay | Admitting: Internal Medicine

## 2014-07-26 ENCOUNTER — Ambulatory Visit (INDEPENDENT_AMBULATORY_CARE_PROVIDER_SITE_OTHER): Payer: 59 | Admitting: Internal Medicine

## 2014-07-26 ENCOUNTER — Ambulatory Visit (INDEPENDENT_AMBULATORY_CARE_PROVIDER_SITE_OTHER)
Admission: RE | Admit: 2014-07-26 | Discharge: 2014-07-26 | Disposition: A | Discharge status: Home / Self Care | Payer: 59 | Source: Ambulatory Visit | Attending: Internal Medicine | Admitting: Internal Medicine

## 2014-07-26 VITALS — BP 122/80 | HR 70 | Ht 69.0 in | Wt 253.0 lb

## 2014-07-26 DIAGNOSIS — J40 Bronchitis, not specified as acute or chronic: Secondary | ICD-10-CM

## 2014-07-26 DIAGNOSIS — J329 Chronic sinusitis, unspecified: Secondary | ICD-10-CM

## 2014-07-26 DIAGNOSIS — J45991 Cough variant asthma: Secondary | ICD-10-CM

## 2014-07-26 DIAGNOSIS — J069 Acute upper respiratory infection, unspecified: Secondary | ICD-10-CM

## 2014-07-26 HISTORY — DX: Chronic sinusitis, unspecified: J32.9

## 2014-07-26 MED ORDER — MOMETASONE FURO-FORMOTEROL FUM 100-5 MCG/ACT IN AERO: 2.0000 | INHALATION_SPRAY | Freq: Two times a day (BID) | RESPIRATORY_TRACT | Status: DC

## 2014-07-26 MED ORDER — PREDNISONE 10 MG PO TABS: ORAL_TABLET | ORAL | Status: DC

## 2014-07-26 MED ORDER — TRAMADOL HCL 50 MG PO TABS: ORAL_TABLET | ORAL | Status: DC

## 2014-07-26 MED ORDER — AZITHROMYCIN 250 MG PO TABS: ORAL_TABLET | ORAL | Status: DC

## 2014-07-26 NOTE — Progress Notes (Signed)
Quick Note:  LMTCB ______ 

## 2014-07-26 NOTE — Assessment & Plan Note (Signed)
Assures me he is fine between ov's with minimal saba need but when he flares, and this is about twice yearly, it it typically severe as is the case with this episode   ? Cough variant asthma > Rx with dulera 100 short term only as not breaking the rule of two's chronically   ? UACS > see uri rx above   The proper method of use, as well as anticipated side effects, of a metered-dose inhaler are discussed and demonstrated to the patient. Improved effectiveness after extensive coaching during this visit to a level of approximately  90%    Each maintenance medication was reviewed in detail including most importantly the difference between maintenance and as needed and under what circumstances the prns are to be used.  Please see instructions for details which were reviewed in writing and the patient given a copy.

## 2014-07-26 NOTE — Telephone Encounter (Signed)
I called and spoke with the pt and notified of cxr results He verbalized understanding  Nothing further needed

## 2014-07-26 NOTE — Progress Notes (Signed)
Quick Note:  Spoke with pt and notified of results per Dr. Wert. Pt verbalized understanding and denied any questions.  ______ 

## 2014-07-26 NOTE — Patient Instructions (Addendum)
zpak Prednisone 10 mg take  4 each am x 2 days,   2 each am x 2 days,  1 each am x 2 days and stop   Dulera 100 Take 2 puffs first thing in am and then another 2 puffs about 12 hours later until better and resume just your proair  As needed up to every 4 hours   Take delsym two tsp every 12 hours and supplement if needed with  tramadol 50 mg up to 2 every 4 hours to suppress the urge to cough. Swallowing water or using ice chips/non mint and menthol containing candies (such as lifesavers or sugarless jolly ranchers) are also effective.  You should rest your voice and avoid activities that you know make you cough.  Once you have eliminated the cough for 3 straight days try reducing the tramadol first,  then the delsym as tolerated.    Change nexium Take 30- 60 min before your first and last meals of the day   GERD (REFLUX)  is an extremely common cause of respiratory symptoms just like yours , many times with no obvious heartburn at all.    It can be treated with medication, but also with lifestyle changes including avoidance of late meals, excessive alcohol, smoking cessation, and avoid fatty foods, chocolate, peppermint, colas, red wine, and acidic juices such as orange juice.  NO MINT OR MENTHOL PRODUCTS SO NO COUGH DROPS  USE SUGARLESS CANDY INSTEAD (Jolley ranchers or Stover's or Life Savers) or even ice chips will also do - the key is to swallow to prevent all throat clearing. NO OIL BASED VITAMINS - use powdered substitutes.  Please remember to go to the  x-ray department downstairs for your tests - we will call you with the results when they are available.

## 2014-07-26 NOTE — Progress Notes (Addendum)
Subjective:    Patient ID: [name], male    DOB: Mar 24, 1966, 48 y.o.   MRN: [id number]     Brief patient profile:  48 yowm  smokeless tobacco user,  Presents for evaluation of chronic cough.   Chief Complaint  Patient presents with  . Advice Only    Pt c/o constant dry hacking cough. Thinks d/t BP medication    He reports a bronchitis episode in December 20 14 the was treated with antibiotics and steroids. His cough has persisted since then. He reports a deep nonproductive cough but occasionally wakes him up at night. The no environmental triggers, he works Biochemist, clinical. There is no shortness of breath or associated wheezing. He denies chest pain, orthopnea or paroxysmal nocturnal dyspnea. He takes daily Nexium and denies breakthrough reflux. He denies seasonal allergies or postnasal drip. He used to be an ACE inhibitor, this was changed to losartan about a year ago.  5/ 2012 2-D echocardiogram showed mild  concentric LVH, normal systolic function. A nuclear stress test showed no inducible or reversible ischemia, no fixed defects. He does have a history of obstructive sleep apnea and did not tolerate CPAP therapy, oral appliance was apparently not covered by insurance.  Spirometry today showed mild restriction with normal ratio,   FEV1 73% -2.87, FVC 3.59 -71%  CXR showed low volumes and no evidence of current pulmonary disease. rec Cough may be post bronchitic or related to reflux Lung function mildly decreased Prednisone 10 mg tabs  Take 2 tabs daily with food x 5ds, then 1 tab daily with food x 5ds then STOP DELSYM cough syrup 64ml thrice daily as needed Benzonatate perles 200 mg thrice daily  Stay on nexium CXR today  07/26/2014 acute  ov/[name] re: recurrent violent coughing  maint on nexium at hs only and prn saba uses very rarely Chief Complaint  Patient presents with  . Follow-up    SOB Wheezing productive cough for a week  baseline no cough no sob  x months and no maint rx and min saba use  / then abrupt cough with chest tightness  X one week> green mucus some sinus congestion and sore throat/ mostly sob when coughing and if not coughing does ok with adls and sleeping Not used inhaler yet this episode despite reported sob/wheeze and previous improvement with saba   No obvious day to day or daytime variabilty or assoc  cp or overt sinus or hb symptoms. No unusual exp hx or h/o childhood pna/ asthma or knowledge of premature birth.  Sleeping ok without nocturnal  or early am exacerbation  of respiratory  c/o's or need for noct saba. Also denies any obvious fluctuation of symptoms with weather or environmental changes or other aggravating or alleviating factors except as outlined above   Current Medications, Allergies, Complete Past Medical History, Past Surgical History, Family History, and Social History were reviewed in [facility] record.  ROS  The following are not active complaints unless bolded sore throat, dysphagia, dental problems, itching, sneezing,  nasal congestion or excess/ purulent secretions, ear ache,   fever, chills, sweats, unintended wt loss, pleuritic or exertional cp, hemoptysis,  orthopnea pnd or leg swelling, presyncope, palpitations, heartburn, abdominal pain, anorexia, nausea, vomiting, diarrhea  or change in bowel or urinary habits, change in stools or urine, dysuria,hematuria,  rash, arthralgias, visual complaints, headache, numbness weakness or ataxia or problems with walking or coordination,  change in mood/affect or memory.  Past Medical History  Diagnosis Date  . Hypertension   . Asthma     Dr. Laurann Montana  . OSA (obstructive sleep apnea)     No past surgical history on file.  Allergies  Allergen Reactions  . Neosporin [Neomycin-Bacitracin Zn-Polymyx]   . Penicillins     History   Social History  . Marital Status: Married    Spouse Name: N/A    Number of Children: 0  .  Years of Education: N/A   Occupational History  . install door frames    Social History Main Topics  . Smoking status: Former Smoker -- 1.00 packs/day for 1 years    Types: Cigarettes    Quit date: 08/14/1987  . Smokeless tobacco: Current User     Comment: dip  . Alcohol Use: Yes     Comment: 2/day  . Drug Use: No  . Sexual Activity: Not on file   Other Topics Concern  . Not on file   Social History Narrative  . No narrative on file    Family History  Problem Relation Age of Onset  . Hypertension Mother   . Colon cancer Mother   . Heart disease Father             Objective:   Physical Exam  Non toxic amb wm who looks great until starts severe coughing fits/ harsh barking quality   Wt Readings from Last 3 Encounters:  07/26/14 253 lb (114.76 kg)  10/16/13 264 lb 6.4 oz (119.931 kg)  07/17/12 255 lb (115.667 kg)    Vital signs reviewed  HEENT: nl dentition, turbinates, and orophanx. Nl external ear canals without cough reflex   NECK :  without JVD/Nodes/TM/ nl carotid upstrokes bilaterally   LUNGS: no acc muscle use, insp and exp rhonchi/ pseudowheeze as well   CV:  RRR  no s3 or murmur or increase in P2, no edema   ABD:  soft and nontender with nl excursion in the supine position. No bruits or organomegaly, bowel sounds nl  MS:  warm without deformities, calf tenderness, cyanosis or clubbing  SKIN: warm and dry without lesions    NEURO:  alert, approp, no deficits     CXR PA and Lateral:   07/26/2014 :   There is no focal pneumonia. This study is limited due to hypo inflation. Perihilar subsegmental atelectasis is not excluded.     Assessment & Plan:   Outpatient Encounter Prescriptions as of 07/26/2014  Medication Sig  . albuterol (PROVENTIL HFA;VENTOLIN HFA) 108 (90 BASE) MCG/ACT inhaler Inhale 2 puffs into the lungs every 6 (six) hours as needed for wheezing or shortness of breath.  . losartan-hydrochlorothiazide (HYZAAR) 50-12.5 MG per  tablet Take 1 tablet by mouth daily.  . Multiple Vitamin (MULTIVITAMIN) tablet Take 1 tablet by mouth daily.  . [DISCONTINUED] esomeprazole (NEXIUM) 20 MG capsule Take 20 mg by mouth daily before breakfast.  . azithromycin (ZITHROMAX) 250 MG tablet Take 2 on day one then 1 daily x 4 days  . mometasone-formoterol (DULERA) 100-5 MCG/ACT AERO Inhale 2 puffs into the lungs 2 (two) times daily.  . ondansetron (ZOFRAN ODT) 4 MG disintegrating tablet Take 1 tablet (4 mg total) by mouth every 4 (four) hours as needed for nausea or vomiting. (Patient not taking: Reported on 07/26/2014)  . oxyCODONE-acetaminophen (PERCOCET) 5-325 MG per tablet Take 1-2 tablets by mouth every 4 (four) hours as needed. (Patient not taking: Reported on 07/26/2014)  . predniSONE (DELTASONE) 10 MG tablet Take  4 each am x 2 days,   2 each am x 2 days,  1 each am x 2 days and stop  . traMADol (ULTRAM) 50 MG tablet 1-2 every 4 hours as needed for cough or pain

## 2014-07-26 NOTE — Assessment & Plan Note (Signed)
Explained the natural history of uri and why it's necessary in patients at risk to treat GERD aggressively - at least  short term -   to reduce risk of evolving cyclical cough initially  triggered by epithelial injury and a heightened sensitivty to the effects of any upper airway irritants,  most importantly acid - related - then perpetuated by epithelial injury related to the cough itself as the upper airway collapses on itself.  That is, the more sensitive the epithelium becomes once it is damaged by the virus, the more the ensuing irritability> the more the cough, the more the secondary reflux (especially in those prone to reflux) the more the irritation of the sensitive mucosa and so on in a  Classic cyclical pattern.    For now rx with zpak/ pred/ max gerd rx and suppress excess cough with tramadol

## 2014-08-02 ENCOUNTER — Telehealth: Payer: Self-pay | Admitting: Pulmonary Disease

## 2014-08-02 ENCOUNTER — Encounter: Payer: Self-pay | Admitting: Adult Health

## 2014-08-02 ENCOUNTER — Ambulatory Visit (INDEPENDENT_AMBULATORY_CARE_PROVIDER_SITE_OTHER): Payer: 59 | Admitting: Adult Health

## 2014-08-02 VITALS — BP 98/78 | HR 77 | Temp 98.5°F | Ht 69.0 in | Wt 251.6 lb

## 2014-08-02 DIAGNOSIS — R21 Rash and other nonspecific skin eruption: Secondary | ICD-10-CM

## 2014-08-02 DIAGNOSIS — J45991 Cough variant asthma: Secondary | ICD-10-CM

## 2014-08-02 HISTORY — DX: Rash and other nonspecific skin eruption: R21

## 2014-08-02 MED ORDER — PREDNISONE 10 MG PO TABS: ORAL_TABLET | ORAL | Status: DC

## 2014-08-02 NOTE — Patient Instructions (Signed)
Prednisone taper over the next week. Begin Zyrtec 10 mg daily at bedtime for 5 days Begin Pepcid 20 mg at bedtime for 5 days May use Benadryl 25 mg at bedtime as needed for itching Stop tramadol, this could be causing your allergic reaction, would avoid taking this in the future. Cold compresses to rash as needed If rash is not improving or worsens, will need to see primary care doctor or seek emergency attention.  Please contact office for sooner follow up if symptoms do not improve or worsen or seek emergency care  Follow up Dr. Elsworth Soho  As planned and As needed

## 2014-08-02 NOTE — Assessment & Plan Note (Signed)
Recent flare now resolving  /? Med reaction to tramadol   Plan  Stop tramadol .

## 2014-08-02 NOTE — Progress Notes (Signed)
   Subjective:    Patient ID: [name], male    DOB: 06-23-1966, 48 y.o.   MRN: [id number]  HPI 48 yo with chronic cough .   08/02/2014 Acute OV  Pt presents for rash on legs; extremely itchy, sweating a lot; feels hot, having to use air condition to cool off.  Was seen 1 week ago with bronchitis , started on Zpak and pred taper. Along with tramadol for cough.  He says cough is better but yesterday felt itchy rash along right axillary area then today very pruritic rash along lower legs . No new skin products.  No dyspnea, wheezing , dysphagia, lip/tongue swelling.  Eating good,    Review of Systems Constitutional:   No  weight loss, night sweats,  Fevers, chills, fatigue, or  lassitude.  HEENT:   No headaches,  Difficulty swallowing,  Tooth/dental problems, or  Sore throat,                No sneezing, itching, ear ache, nasal congestion, post nasal drip,   CV:  No chest pain,  Orthopnea, PND, swelling in lower extremities, anasarca, dizziness, palpitations, syncope.   GI  No heartburn, indigestion, abdominal pain, nausea, vomiting, diarrhea, change in bowel habits, loss of appetite, bloody stools.   Resp: No shortness of breath with exertion or at rest.  No excess mucus, no productive cough,  No non-productive cough,  No coughing up of blood.  No change in color of mucus.  No wheezing.  No chest wall deformity  Skin: +rash   GU: no dysuria, change in color of urine, no urgency or frequency.  No flank pain, no hematuria   MS:  No joint pain or swelling.  No decreased range of motion.  No back pain.  Psych:  No change in mood or affect. No depression or anxiety.  No memory loss.         Objective:   Physical Exam GEN: A/Ox3; pleasant , NAD, well nourished   HEENT:  [place]/AT,  EACs-clear, TMs-wnl, NOSE-clear, THROAT-clear, no lesions, no postnasal drip or exudate noted.   NECK:  Supple w/ fair ROM; no JVD; normal carotid impulses w/o bruits; no thyromegaly or nodules  palpated; no lymphadenopathy.  RESP  Clear  P & A; w/o, wheezes/ rales/ or rhonchi.no accessory muscle use, no dullness to percussion  CARD:  RRR, no m/r/g  , no peripheral edema, pulses intact, no cyanosis or clubbing.  GI:   Soft & nt; nml bowel sounds; no organomegaly or masses detected.  Musco: Warm bil, no deformities or joint swelling noted.   Neuro: alert, no focal deficits noted.    Skin: Warm, along lower ext with notable papular diffuse urticarial rash. No vesicles noted.           Assessment & Plan:

## 2014-08-02 NOTE — Telephone Encounter (Signed)
Pt saw MW last Monday. He now has a rash on his legs that is red and itching. OV has been made for pt to see TP today at 4:15pm.

## 2014-08-02 NOTE — Assessment & Plan Note (Signed)
Rash ? Allergic reaction to tramadol   Plan  Prednisone taper over the next week. Begin Zyrtec 10 mg daily at bedtime for 5 days Begin Pepcid 20 mg at bedtime for 5 days May use Benadryl 25 mg at bedtime as needed for itching Stop tramadol, this could be causing your allergic reaction, would avoid taking this in the future. Cold compresses to rash as needed If rash is not improving or worsens, will need to see primary care doctor or seek emergency attention.  Please contact office for sooner follow up if symptoms do not improve or worsen or seek emergency care  Follow up Dr. Elsworth Soho  As planned and As needed

## 2014-08-04 NOTE — Progress Notes (Signed)
Reviewed & agree with plan  

## 2014-12-08 ENCOUNTER — Encounter: Payer: Self-pay | Admitting: Adult Health

## 2014-12-08 ENCOUNTER — Ambulatory Visit (INDEPENDENT_AMBULATORY_CARE_PROVIDER_SITE_OTHER): Payer: 59 | Admitting: Adult Health

## 2014-12-08 ENCOUNTER — Telehealth: Payer: Self-pay | Admitting: Pulmonary Disease

## 2014-12-08 VITALS — BP 140/78 | HR 82 | Temp 98.0°F | Ht 69.0 in | Wt 256.0 lb

## 2014-12-08 DIAGNOSIS — J45991 Cough variant asthma: Secondary | ICD-10-CM

## 2014-12-08 MED ORDER — AZITHROMYCIN 250 MG PO TABS: ORAL_TABLET | ORAL | Status: AC

## 2014-12-08 MED ORDER — HYDROCODONE-HOMATROPINE 5-1.5 MG/5ML PO SYRP: 5.0000 mL | ORAL_SOLUTION | Freq: Four times a day (QID) | ORAL | Status: DC | PRN

## 2014-12-08 MED ORDER — PREDNISONE 10 MG PO TABS: ORAL_TABLET | ORAL | Status: DC

## 2014-12-08 NOTE — Assessment & Plan Note (Signed)
Exacerbation with URI  xopenex neb x 1   Plan  Z-Pak take as directed Prednisone taper over next week.  Mucinex DM twice daily as needed for cough and congestion Fluids and rest  Hydromet 1 tsp every 6hr as needed , may make you sleepy.  Please contact office for sooner follow up if symptoms do not improve or worsen or seek emergency care

## 2014-12-08 NOTE — Telephone Encounter (Signed)
Called and spoke to pt. Pt c/o increase in SOB, prod cough with yellow and green mucus, chest congestion, chest tightness and sinus pressure. Appt made with TP today (4/27) at 1100. Pt verbalized understanding and denied any further questions or concerns at this time.

## 2014-12-08 NOTE — Patient Instructions (Addendum)
Z-Pak take as directed Prednisone taper over next week.  Mucinex DM twice daily as needed for cough and congestion Fluids and rest  Hydromet 1 tsp every 6hr as needed , may make you sleepy.  Please contact office for sooner follow up if symptoms do not improve or worsen or seek emergency care

## 2014-12-08 NOTE — Progress Notes (Signed)
   Subjective:    Patient ID: [name], male    DOB: July 22, 1966, 49 y.o.   MRN: [id number]  HPI 49 yo former smoker with chronic cough .   12/08/2014 Acute OV  Pt presents for an acute office visit.  Complains of  fever, prod cough with green/brown mucus, sob, some chest tightness with coughing X3 days.  Feverish.  Taking saline nasal rinses , claritin, nasacort . Cough is keeping him up at night.  Patient denies any hemoptysis, chest pain, orthopnea, PND or leg swelling.     Review of Systems Constitutional:   No  weight loss, night sweats,   HEENT:   No headaches,  Difficulty swallowing,  Tooth/dental problems, or  Sore throat,                No sneezing, itching, ear ache,  +nasal congestion, post nasal drip,   CV:  No chest pain,  Orthopnea, PND, swelling in lower extremities, anasarca, dizziness, palpitations, syncope.   GI  No heartburn, indigestion, abdominal pain, nausea, vomiting, diarrhea, change in bowel habits, loss of appetite, bloody stools.   Resp:   No chest wall deformity  Skin: no rash    GU: no dysuria, change in color of urine, no urgency or frequency.  No flank pain, no hematuria   MS:  No joint pain or swelling.  No decreased range of motion.  No back pain.  Psych:  No change in mood or affect. No depression or anxiety.  No memory loss.         Objective:   Physical Exam GEN: A/Ox3; pleasant , NAD, well nourished   HEENT:  [place]/AT,  EACs-clear, TMs-wnl, NOSE-clear, THROAT-clear, no lesions, no postnasal drip or exudate noted.   NECK:  Supple w/ fair ROM; no JVD; normal carotid impulses w/o bruits; no thyromegaly or nodules palpated; no lymphadenopathy.  RESP  Faint exp wheeze on forced exp , no accessory muscle use, no dullness to percussion  CARD:  RRR, no m/r/g  , no peripheral edema, pulses intact, no cyanosis or clubbing.  GI:   Soft & nt; nml bowel sounds; no organomegaly or masses detected.  Musco: Warm bil, no deformities or joint  swelling noted.   Neuro: alert, no focal deficits noted.    Skin: Warm, along lower ext with notable papular diffuse urticarial rash. No vesicles noted.           Assessment & Plan:

## 2014-12-10 DIAGNOSIS — J45991 Cough variant asthma: Secondary | ICD-10-CM | POA: Diagnosis not present

## 2014-12-10 MED ORDER — LEVALBUTEROL HCL 0.63 MG/3ML IN NEBU
0.6300 mg | INHALATION_SOLUTION | Freq: Once | RESPIRATORY_TRACT | Status: AC
  Administered 2014-12-10: 0.63 mg via RESPIRATORY_TRACT

## 2014-12-10 NOTE — Addendum Note (Signed)
Addended by: Parke Poisson E on: 12/10/2014 10:04 AM   Modules accepted: Orders

## 2015-03-21 ENCOUNTER — Emergency Department (HOSPITAL_COMMUNITY): Payer: 59

## 2015-03-21 ENCOUNTER — Emergency Department (HOSPITAL_COMMUNITY)
Admission: EM | Admit: 2015-03-21 | Discharge: 2015-03-21 | Disposition: A | Discharge status: Home / Self Care | Payer: 59 | Attending: Emergency Medicine | Admitting: Emergency Medicine

## 2015-03-21 ENCOUNTER — Encounter (HOSPITAL_COMMUNITY): Payer: Self-pay | Admitting: Emergency Medicine

## 2015-03-21 DIAGNOSIS — Z880 Allergy status to penicillin: Secondary | ICD-10-CM | POA: Insufficient documentation

## 2015-03-21 DIAGNOSIS — R109 Unspecified abdominal pain: Secondary | ICD-10-CM

## 2015-03-21 DIAGNOSIS — N201 Calculus of ureter: Secondary | ICD-10-CM | POA: Insufficient documentation

## 2015-03-21 DIAGNOSIS — J45909 Unspecified asthma, uncomplicated: Secondary | ICD-10-CM | POA: Diagnosis not present

## 2015-03-21 DIAGNOSIS — I10 Essential (primary) hypertension: Secondary | ICD-10-CM | POA: Diagnosis not present

## 2015-03-21 DIAGNOSIS — Z79899 Other long term (current) drug therapy: Secondary | ICD-10-CM | POA: Insufficient documentation

## 2015-03-21 DIAGNOSIS — Z87891 Personal history of nicotine dependence: Secondary | ICD-10-CM | POA: Diagnosis not present

## 2015-03-21 DIAGNOSIS — Z8669 Personal history of other diseases of the nervous system and sense organs: Secondary | ICD-10-CM | POA: Insufficient documentation

## 2015-03-21 DIAGNOSIS — R319 Hematuria, unspecified: Secondary | ICD-10-CM

## 2015-03-21 LAB — BASIC METABOLIC PANEL
Anion gap: 11 (ref 5–15)
BUN: 12 mg/dL (ref 6–20)
CO2: 24 mmol/L (ref 22–32)
Calcium: 8.9 mg/dL (ref 8.9–10.3)
Chloride: 103 mmol/L (ref 101–111)
Creatinine, Ser: 0.89 mg/dL (ref 0.61–1.24)
GFR calc non Af Amer: >60 mL/min (ref >60)
Glucose, Bld: 118 mg/dL — ABNORMAL HIGH (ref 65–99)
Potassium: 3.7 mmol/L (ref 3.5–5.1)
SODIUM: 138 mmol/L (ref 135–145)

## 2015-03-21 LAB — URINALYSIS, ROUTINE W REFLEX MICROSCOPIC
GLUCOSE, UA: NEGATIVE mg/dL
Ketones, ur: NEGATIVE mg/dL
Leukocytes, UA: NEGATIVE
Nitrite: NEGATIVE
PH: 5.5 (ref 5.0–8.0)
PROTEIN: 30 mg/dL — AB
Specific Gravity, Urine: 1.027 (ref 1.005–1.030)
Urobilinogen, UA: 0.2 mg/dL (ref 0.0–1.0)

## 2015-03-21 LAB — URINE MICROSCOPIC-ADD ON

## 2015-03-21 LAB — CBC WITH DIFFERENTIAL/PLATELET
BASOS PCT: 0 % (ref 0–1)
Basophils Absolute: 0 10*3/uL (ref 0.0–0.1)
Eosinophils Absolute: 0.2 10*3/uL (ref 0.0–0.7)
Eosinophils Relative: 3 % (ref 0–5)
HCT: 44.1 % (ref 39.0–52.0)
HEMOGLOBIN: 15.4 g/dL (ref 13.0–17.0)
LYMPHS ABS: 1.5 10*3/uL (ref 0.7–4.0)
Lymphocytes Relative: 24 % (ref 12–46)
MCH: 30.7 pg (ref 26.0–34.0)
MCHC: 34.9 g/dL (ref 30.0–36.0)
MCV: 88 fL (ref 78.0–100.0)
MONO ABS: 0.3 10*3/uL (ref 0.1–1.0)
Monocytes Relative: 5 % (ref 3–12)
NEUTROS PCT: 68 % (ref 43–77)
Neutro Abs: 4.4 10*3/uL (ref 1.7–7.7)
PLATELETS: 148 10*3/uL — AB (ref 150–400)
RBC: 5.01 MIL/uL (ref 4.22–5.81)
RDW: 12.7 % (ref 11.5–15.5)
WBC: 6.4 10*3/uL (ref 4.0–10.5)

## 2015-03-21 MED ORDER — ONDANSETRON HCL 4 MG/2ML IJ SOLN
4.0000 mg | Freq: Once | INTRAMUSCULAR | Status: AC
  Administered 2015-03-21: 4 mg via INTRAVENOUS
  Filled 2015-03-21: qty 2

## 2015-03-21 MED ORDER — CEPHALEXIN 500 MG PO CAPS: 500.0000 mg | ORAL_CAPSULE | Freq: Four times a day (QID) | ORAL | Status: DC

## 2015-03-21 MED ORDER — ONDANSETRON 4 MG PO TBDP
4.0000 mg | ORAL_TABLET | Freq: Three times a day (TID) | ORAL | Status: DC | PRN
Start: 2015-03-21 — End: 2015-03-29

## 2015-03-21 MED ORDER — KETOROLAC TROMETHAMINE 30 MG/ML IJ SOLN
30.0000 mg | Freq: Once | INTRAMUSCULAR | Status: AC
  Administered 2015-03-21: 30 mg via INTRAVENOUS
  Filled 2015-03-21: qty 1

## 2015-03-21 MED ORDER — TAMSULOSIN HCL 0.4 MG PO CAPS: 0.4000 mg | ORAL_CAPSULE | Freq: Every day | ORAL | Status: DC

## 2015-03-21 MED ORDER — OXYCODONE-ACETAMINOPHEN 5-325 MG PO TABS
1.0000 | ORAL_TABLET | ORAL | Status: DC | PRN
Start: 2015-03-21 — End: 2015-03-29

## 2015-03-21 NOTE — ED Notes (Signed)
Gave patient a little water

## 2015-03-21 NOTE — ED Notes (Signed)
Pt reports sudden onset of R sided flank pain this morning. 8/10 pain at the time. Denies urinary symptoms. History of kidney stones in the past. Pt is alert and oriented x4.

## 2015-03-21 NOTE — ED Provider Notes (Signed)
CSN: [id number]     Arrival date & time 03/21/15  0622 History   None    Chief Complaint  Patient presents with  . Nephrolithiasis     (Consider location/radiation/quality/duration/timing/severity/associated sxs/prior Treatment) The history is provided by the patient and medical records.    This is a 49 y.o. M with hx of HTN, asthma, OSA, kidney stones, presenting to the ED for right flank pain.  Patient states he was getting ready to bring his mother to the ED when he developed sudden onset of right flank pain with some radiation towards his abdomen and groin. He denies any testicle pain. He does have some nausea but denies vomiting. Pain is sharp, stabbing. He states similar symptoms previously with kidney stones. He denies any fever or chills. States last on was approximate 1 year ago which passed spontaneously. He states recently he has been drinking a lot of soda and thinks this is why he is having symptoms. No intervention dry prior to arrival. Vital signs stable.  Past Medical History  Diagnosis Date  . Hypertension   . Asthma     Dr. Laurann Montana  . OSA (obstructive sleep apnea)    History reviewed. No pertinent past surgical history. Family History  Problem Relation Age of Onset  . Hypertension Mother   . Colon cancer Mother   . Heart disease Father    History  Substance Use Topics  . Smoking status: Former Smoker -- 1.00 packs/day for 1 years    Types: Cigarettes    Quit date: 08/14/1987  . Smokeless tobacco: Current User    Types: Chew     Comment: dip  . Alcohol Use: Yes     Comment: 2/day    Review of Systems  Genitourinary: Positive for flank pain.  All other systems reviewed and are negative.     Allergies  Neosporin; Penicillins; and Tramadol hcl  Home Medications   Prior to Admission medications   Medication Sig Start Date End Date Taking? Authorizing Provider  albuterol (PROVENTIL HFA;VENTOLIN HFA) 108 (90 BASE) MCG/ACT inhaler Inhale 2 puffs into the  lungs every 6 (six) hours as needed for wheezing or shortness of breath.    Historical Provider, MD  esomeprazole (NEXIUM) 40 MG capsule Take 40 mg by mouth daily at 12 noon.    Historical Provider, MD  HYDROcodone-homatropine (HYDROMET) 5-1.5 MG/5ML syrup Take 5 mLs by mouth every 6 (six) hours as needed. 12/08/14   [name] S Parrett, NP  losartan-hydrochlorothiazide (HYZAAR) 50-12.5 MG per tablet Take 1 tablet by mouth daily.    Historical Provider, MD  Multiple Vitamin (MULTIVITAMIN) tablet Take 1 tablet by mouth daily.    Historical Provider, MD  predniSONE (DELTASONE) 10 MG tablet 4 tabs for 2 days, then 3 tabs for 2 days, 2 tabs for 2 days, then 1 tab for 2 days, then stop 12/08/14   [name] S Parrett, NP   BP 147/93 mmHg  Pulse 69  Temp(Src) 97.5 F (36.4 C) (Oral)  Resp 18  Ht 5\' 9"  (1.753 m)  Wt 260 lb (117.935 kg)  BMI 38.38 kg/m2  SpO2 94%   Physical Exam  Constitutional: He is oriented to person, place, and time. He appears well-developed and well-nourished. No distress.  Appears uncomfortable, pacing in room  HENT:  Head: Normocephalic and atraumatic.  Mouth/Throat: Oropharynx is clear and moist.  Eyes: Conjunctivae and EOM are normal. Pupils are equal, round, and reactive to light.  Neck: Normal range of motion. Neck supple.  Cardiovascular:  Normal rate, regular rhythm and normal heart sounds.   Pulmonary/Chest: Effort normal and breath sounds normal. No respiratory distress. He has no wheezes.  Abdominal: Soft. Bowel sounds are normal. There is no tenderness. There is CVA tenderness (right). There is no guarding.  Musculoskeletal: Normal range of motion. He exhibits no edema.  Neurological: He is alert and oriented to person, place, and time.  Skin: Skin is warm and dry. He is not diaphoretic.  Psychiatric: He has a normal mood and affect.  Nursing note and vitals reviewed.   ED Course  Procedures (including critical care time) Labs Review Labs Reviewed  CBC WITH  DIFFERENTIAL/PLATELET - Abnormal; Notable for the following:    Platelets 148 (*)    All other components within normal limits  BASIC METABOLIC PANEL - Abnormal; Notable for the following:    Glucose, Bld 118 (*)    All other components within normal limits  URINALYSIS, ROUTINE W REFLEX MICROSCOPIC (NOT AT [facility]) - Abnormal; Notable for the following:    Color, Urine AMBER (*)    APPearance TURBID (*)    Hgb urine dipstick LARGE (*)    Bilirubin Urine SMALL (*)    Protein, ur 30 (*)    All other components within normal limits  URINE MICROSCOPIC-ADD ON - Abnormal; Notable for the following:    Bacteria, UA MANY (*)    Crystals CA OXALATE CRYSTALS (*)    All other components within normal limits  URINE CULTURE    Imaging Review Ct Renal Stone Study  03/21/2015   CLINICAL DATA:  49 year old hypertensive male with right back pain. History kidney stones. Subsequent encounter.  EXAM: CT ABDOMEN AND PELVIS WITHOUT CONTRAST  TECHNIQUE: Multidetector CT imaging of the abdomen and pelvis was performed following the standard protocol without IV contrast.  COMPARISON:  04/28/2014 CT.  FINDINGS: Distal right ureteral 4 x 2 x 5.3 mm calculi located 2.4 cm proximal to the right ureteral vesical junction. Minimal to mild fullness of the right ureter proximal to this level with minimal fullness of the proximal right renal collecting system.  3 mm lower pole nonobstructing renal calculi bilaterally. 2 mm nonobstructing left mid renal calculus.  Taking into account limitation by non contrast imaging, no worrisome hepatic, splenic, pancreatic, renal or adrenal lesion. No calcified gallstones.  Diffuse fatty infiltration of liver. Elongated spleen spanning over 14.7 cm.  Markedly advanced prominent coronary artery calcifications. Heart size within normal limits.  Ectatic calcified splenic artery.  No abdominal aortic aneurysm.  No extra luminal bowel inflammatory process, free fluid or free air. Mild diastases of  the rectus muscles.  Slight prominent peripancreatic lymph node with short axis dimension of 1.4 cm without significant change.  Mild degenerative changes lumbar spine and lower thoracic spine without osseous destructive lesion.  Noncontrast filled views of the urinary bladder unremarkable.  Small calcifications of the prostate gland which appears minimally asymmetric. Clinical and laboratory correlation recommended.  IMPRESSION: Distal right ureteral 4 x 2 x 5.3 mm calculi located 2.4 cm proximal to the right ureteral vesical junction. Minimal to mild fullness of the right ureter proximal to this level with minimal fullness of the proximal right renal collecting system.  3 mm lower pole nonobstructing renal calculi bilaterally. 2 mm nonobstructing left mid renal calculus.  Diffuse fatty infiltration of liver.  Elongated spleen spanning over 14.7 cm (previously 12 cm).  Slight prominent peripancreatic lymph node with short axis dimension of 1.4 cm without significant change.  Markedly advanced prominent coronary  artery calcifications.  Ectatic calcified splenic artery.  Small calcifications of the prostate gland which appears minimally asymmetric. Clinical and laboratory correlation recommended.   Electronically Signed   By: Genia Del M.D.   On: 03/21/2015 09:58     EKG Interpretation None      MDM   Final diagnoses:  Right flank pain  Left ureteral calculus  Hematuria   49 year old male with right flank pain. Does have history kidney stones. Patient afebrile, nontoxic. She does appear uncomfortable, pacing in the room. Labwork as above, renal function preserved. UA appears infectious with noted hematuria, culture pending. CT scan with right > left ureteral calculi, mild hydronephrosis on right.  Patient treated here with toradol and zofran, improvement of symptoms.  He is followed by urology, Dr. Diona Fanti-- will follow-up with them.  Rx keflex, zofran, percocet, flomax.  Discussed plan with  patient, he/she acknowledged understanding and agreed with plan of care.  Return precautions given for new or worsening symptoms.  Larene Pickett, PA-C 03/21/15 [place], MD 03/21/15 [phone]

## 2015-03-21 NOTE — Discharge Instructions (Signed)
Take the prescribed medication as directed. Follow-up with Dr. Diona Fanti-- call to make appt. Return to the ED for new or worsening symptoms.

## 2015-03-21 NOTE — ED Notes (Signed)
Pt reports he was getting ready to bring his mother to the ED when he began having right back pain which he called a kidney stone.  Pt reports that he had consumed "way too much soda".  Pain 5-10

## 2015-03-23 LAB — URINE CULTURE: Culture: 2000

## 2015-03-28 ENCOUNTER — Encounter (HOSPITAL_COMMUNITY): Payer: Self-pay | Admitting: Emergency Medicine

## 2015-03-28 ENCOUNTER — Emergency Department (HOSPITAL_COMMUNITY)
Admission: EM | Admit: 2015-03-28 | Discharge: 2015-03-29 | Disposition: A | Discharge status: Home / Self Care | Payer: 59 | Attending: Emergency Medicine | Admitting: Emergency Medicine

## 2015-03-28 DIAGNOSIS — Z87442 Personal history of urinary calculi: Secondary | ICD-10-CM | POA: Diagnosis not present

## 2015-03-28 DIAGNOSIS — Z7951 Long term (current) use of inhaled steroids: Secondary | ICD-10-CM | POA: Diagnosis not present

## 2015-03-28 DIAGNOSIS — Z79899 Other long term (current) drug therapy: Secondary | ICD-10-CM | POA: Insufficient documentation

## 2015-03-28 DIAGNOSIS — Z880 Allergy status to penicillin: Secondary | ICD-10-CM | POA: Diagnosis not present

## 2015-03-28 DIAGNOSIS — Z792 Long term (current) use of antibiotics: Secondary | ICD-10-CM | POA: Insufficient documentation

## 2015-03-28 DIAGNOSIS — N23 Unspecified renal colic: Secondary | ICD-10-CM | POA: Insufficient documentation

## 2015-03-28 DIAGNOSIS — I10 Essential (primary) hypertension: Secondary | ICD-10-CM | POA: Insufficient documentation

## 2015-03-28 DIAGNOSIS — Z8669 Personal history of other diseases of the nervous system and sense organs: Secondary | ICD-10-CM | POA: Diagnosis not present

## 2015-03-28 DIAGNOSIS — J45909 Unspecified asthma, uncomplicated: Secondary | ICD-10-CM | POA: Diagnosis not present

## 2015-03-28 DIAGNOSIS — R109 Unspecified abdominal pain: Secondary | ICD-10-CM | POA: Diagnosis present

## 2015-03-28 DIAGNOSIS — Z87891 Personal history of nicotine dependence: Secondary | ICD-10-CM | POA: Diagnosis not present

## 2015-03-28 HISTORY — DX: Disorder of kidney and ureter, unspecified: N28.9

## 2015-03-28 HISTORY — DX: Calculus of kidney: N20.0

## 2015-03-28 LAB — CBC
HCT: 43.7 % (ref 39.0–52.0)
Hemoglobin: 15.6 g/dL (ref 13.0–17.0)
MCH: 31.3 pg (ref 26.0–34.0)
MCHC: 35.7 g/dL (ref 30.0–36.0)
MCV: 87.6 fL (ref 78.0–100.0)
PLATELETS: 170 10*3/uL (ref 150–400)
RBC: 4.99 MIL/uL (ref 4.22–5.81)
RDW: 12.9 % (ref 11.5–15.5)
WBC: 12.3 10*3/uL — ABNORMAL HIGH (ref 4.0–10.5)

## 2015-03-28 MED ORDER — ONDANSETRON HCL 4 MG/2ML IJ SOLN
4.0000 mg | Freq: Once | INTRAMUSCULAR | Status: AC
  Administered 2015-03-28: 4 mg via INTRAVENOUS
  Filled 2015-03-28: qty 2

## 2015-03-28 MED ORDER — HYDROMORPHONE HCL 1 MG/ML IJ SOLN
1.0000 mg | Freq: Once | INTRAMUSCULAR | Status: AC
  Administered 2015-03-28: 1 mg via INTRAVENOUS
  Filled 2015-03-28: qty 1

## 2015-03-28 MED ORDER — SODIUM CHLORIDE 0.9 % IV SOLN
INTRAVENOUS | Status: DC
  Administered 2015-03-28: via INTRAVENOUS

## 2015-03-28 NOTE — ED Provider Notes (Signed)
CSN: [id number]     Arrival date & time 03/28/15  2226 History  This chart was scribed for Rolland Porter, MD by Randa Evens, ED Scribe. This patient was seen in room A06C/A06C and the patient's care was started at 11:22 PM.    Chief Complaint  Patient presents with  . Flank Pain    Patient is a 49 y.o. male presenting with flank pain. The history is provided by the patient. No language interpreter was used.  Flank Pain   HPI Comments: RICHARDO POPOFF is a 49 y.o. male who presents to the Emergency Department complaining of improving, recurrent right sided flank pain onset 7:30 PM. Pt states that earlier tonight the pain was sharp and is now improving. Pt rates the severity of the pain is currently 5/10. Pt states that the pain is non radiating. Pt reports nausea and vomiting. Pt doesn't report any alleviating factors. Nothing makes the pain worse. Denies hematuria or other related symptoms. Pt was recently diagnosed with a kidney stone 1 week prior. Pt states that after being diagnosed with the kidney stone his pain resolved after about 3 days. Pt states that he was prescribed antibiotics but stopped taking them 3 days after having them subscribed. Pt denies tobacco use. Pt does report alcohol use occasionally.   PCP Dr Electa Sniff Urology Dr Vernie Shanks  Past Medical History  Diagnosis Date  . Hypertension   . Asthma     Dr. Laurann Montana  . OSA (obstructive sleep apnea)   . Renal disorder   . Kidney stones    History reviewed. No pertinent past surgical history. Family History  Problem Relation Age of Onset  . Hypertension Mother   . Colon cancer Mother   . Heart disease Father    Social History  Substance Use Topics  . Smoking status: Former Smoker -- 1.00 packs/day for 1 years    Types: Cigarettes    Quit date: 08/14/1987  . Smokeless tobacco: Current User    Types: Chew     Comment: dip  . Alcohol Use: Yes     Comment: 2/day  employed Lives with spouse  Review of Systems   Gastrointestinal: Positive for nausea and vomiting.  Genitourinary: Positive for flank pain. Negative for hematuria.  All other systems reviewed and are negative.    Allergies  Neosporin; Penicillins; and Tramadol hcl  Home Medications   Prior to Admission medications   Medication Sig Start Date End Date Taking? Authorizing Provider  albuterol (PROVENTIL HFA;VENTOLIN HFA) 108 (90 BASE) MCG/ACT inhaler Inhale 2 puffs into the lungs every 6 (six) hours as needed for wheezing or shortness of breath.   Yes Historical Provider, MD  cephALEXin (KEFLEX) 500 MG capsule Take 1 capsule (500 mg total) by mouth 4 (four) times daily. 03/21/15  Yes Larene Pickett, PA-C  esomeprazole (NEXIUM) 40 MG capsule Take 40 mg by mouth daily at 12 noon.   Yes Historical Provider, MD  losartan-hydrochlorothiazide (HYZAAR) 50-12.5 MG per tablet Take 1 tablet by mouth daily.   Yes Historical Provider, MD  mometasone (NASONEX) 50 MCG/ACT nasal spray Place 2 sprays into the nose daily.   Yes Historical Provider, MD  Multiple Vitamin (MULTIVITAMIN) tablet Take 1 tablet by mouth daily.   Yes Historical Provider, MD  ondansetron (ZOFRAN ODT) 4 MG disintegrating tablet Take 1 tablet (4 mg total) by mouth every 8 (eight) hours as needed for nausea or vomiting. 03/29/15   Rolland Porter, MD  oxyCODONE-acetaminophen (ROXICET) 5-325 MG per tablet  Take 1 or 2 po Q 6hrs for pain 03/29/15   Rolland Porter, MD  tamsulosin (FLOMAX) 0.4 MG CAPS capsule Take 1 po QD until you pass the stone. 03/29/15   Rolland Porter, MD   BP 137/76 mmHg  Pulse 76  Temp(Src) 97.8 F (36.6 C) (Oral)  Resp 18  Ht 5\' 9"  (1.753 m)  Wt 260 lb (117.935 kg)  BMI 38.38 kg/m2  SpO2 95%  Vital signs normal     Physical Exam  Constitutional: He is oriented to person, place, and time. He appears well-developed and well-nourished.  Non-toxic appearance. He does not appear ill. No distress.  Appears uncomfortable.   HENT:  Head: Normocephalic and atraumatic.  Right  Ear: External ear normal.  Left Ear: External ear normal.  Nose: Nose normal. No mucosal edema or rhinorrhea.  Mouth/Throat: Oropharynx is clear and moist and mucous membranes are normal. No dental abscesses or uvula swelling.  Eyes: Conjunctivae and EOM are normal. Pupils are equal, round, and reactive to light.  Neck: Normal range of motion and full passive range of motion without pain. Neck supple.  Cardiovascular: Normal rate, regular rhythm and normal heart sounds.  Exam reveals no gallop and no friction rub.   No murmur heard. Pulmonary/Chest: Effort normal and breath sounds normal. No respiratory distress. He has no wheezes. He has no rhonchi. He has no rales. He exhibits no tenderness and no crepitus.  Abdominal: Soft. Normal appearance and bowel sounds are normal. He exhibits no distension. There is no tenderness. There is no rebound and no guarding.  Genitourinary:  No CVAT  Musculoskeletal: Normal range of motion. He exhibits no edema or tenderness.       Back:  Moves all extremities well.  Area of pain noted  Neurological: He is alert and oriented to person, place, and time. He has normal strength. No cranial nerve deficit.  Skin: Skin is warm, dry and intact. No rash noted. No erythema. No pallor.  Psychiatric: He has a normal mood and affect. His speech is normal and behavior is normal. His mood appears not anxious.  Nursing note and vitals reviewed.   ED Course  Procedures (including critical care time)  Medications  0.9 %  sodium chloride infusion ( Intravenous New Bag/Given 03/28/15 2341)  HYDROmorphone (DILAUDID) injection 1 mg (1 mg Intravenous Given 03/28/15 2342)  ondansetron (ZOFRAN) injection 4 mg (4 mg Intravenous Given 03/28/15 2342)    DIAGNOSTIC STUDIES: Oxygen Saturation is 95% on RA, adequate by my interpretation.    COORDINATION OF CARE: 11:43 PM-Discussed treatment plan with pt at bedside and pt agreed to plan. PT was given IV pain and nausea  medication. Pt has documented Rt UVJ stone, no further radiological studies were done.   12:49 AM-Rechecked with Pt, He states that he is nearly pain free and is ready to be discharged.    Labs Review Results for orders placed or performed during the hospital encounter of 03/28/15  Comprehensive metabolic panel  Result Value Ref Range   Sodium 140 135 - 145 mmol/L   Potassium 3.7 3.5 - 5.1 mmol/L   Chloride 102 101 - 111 mmol/L   CO2 26 22 - 32 mmol/L   Glucose, Bld 141 (H) 65 - 99 mg/dL   BUN 14 6 - 20 mg/dL   Creatinine, Ser 1.16 0.61 - 1.24 mg/dL   Calcium 9.7 8.9 - 10.3 mg/dL   Total Protein 7.4 6.5 - 8.1 g/dL   Albumin 4.6 3.5 - 5.0 g/dL  AST 42 (H) 15 - 41 U/L   ALT 60 17 - 63 U/L   Alkaline Phosphatase 65 38 - 126 U/L   Total Bilirubin 0.9 0.3 - 1.2 mg/dL   GFR calc non Af Amer >60 >60 mL/min   GFR calc Af Amer >60 >60 mL/min   Anion gap 12 5 - 15  CBC  Result Value Ref Range   WBC 12.3 (H) 4.0 - 10.5 K/uL   RBC 4.99 4.22 - 5.81 MIL/uL   Hemoglobin 15.6 13.0 - 17.0 g/dL   HCT 43.7 39.0 - 52.0 %   MCV 87.6 78.0 - 100.0 fL   MCH 31.3 26.0 - 34.0 pg   MCHC 35.7 30.0 - 36.0 g/dL   RDW 12.9 11.5 - 15.5 %   Platelets 170 150 - 400 K/uL    Laboratory interpretation all normal except leukocytosis     Imaging Review No results found.   Ct Renal Stone Study  03/21/2015   CLINICAL DATA:  49 year old hypertensive male with right back pain. History kidney stones. Subsequent encounter.   IMPRESSION: Distal right ureteral 4 x 2 x 5.3 mm calculi located 2.4 cm proximal to the right ureteral vesical junction. Minimal to mild fullness of the right ureter proximal to this level with minimal fullness of the proximal right renal collecting system.  3 mm lower pole nonobstructing renal calculi bilaterally. 2 mm nonobstructing left mid renal calculus.  Diffuse fatty infiltration of liver.  Elongated spleen spanning over 14.7 cm (previously 12 cm).  Slight prominent peripancreatic  lymph node with short axis dimension of 1.4 cm without significant change.  Markedly advanced prominent coronary artery calcifications.  Ectatic calcified splenic artery.  Small calcifications of the prostate gland which appears minimally asymmetric. Clinical and laboratory correlation recommended.   Electronically Signed   By: Genia Del M.D.   On: 03/21/2015 09:58    I, Rolland Porter, MD, personally reviewed and evaluated these images and lab results as part of my medical decision-making.   EKG Interpretation None      MDM   Final diagnoses:  Ureteral colic   New Prescriptions   ONDANSETRON (ZOFRAN ODT) 4 MG DISINTEGRATING TABLET    Take 1 tablet (4 mg total) by mouth every 8 (eight) hours as needed for nausea or vomiting.   OXYCODONE-ACETAMINOPHEN (ROXICET) 5-325 MG PER TABLET    Take 1 or 2 po Q 6hrs for pain   TAMSULOSIN (FLOMAX) 0.4 MG CAPS CAPSULE    Take 1 po QD until you pass the stone.    Plan discharge   Rolland Porter, MD, FACEP    I personally performed the services described in this documentation, which was scribed in my presence. The recorded information has been reviewed and considered.  Laboratory interpretation all normal except      Rolland Porter, MD 03/29/15 [phone]

## 2015-03-28 NOTE — ED Notes (Signed)
Pt. reports right flank pain with emesis and oliguria  onset today , denies hematuria .

## 2015-03-29 LAB — COMPREHENSIVE METABOLIC PANEL
ALBUMIN: 4.6 g/dL (ref 3.5–5.0)
ALK PHOS: 65 U/L (ref 38–126)
ALT: 60 U/L (ref 17–63)
AST: 42 U/L — ABNORMAL HIGH (ref 15–41)
Anion gap: 12 (ref 5–15)
BILIRUBIN TOTAL: 0.9 mg/dL (ref 0.3–1.2)
BUN: 14 mg/dL (ref 6–20)
CALCIUM: 9.7 mg/dL (ref 8.9–10.3)
CO2: 26 mmol/L (ref 22–32)
Chloride: 102 mmol/L (ref 101–111)
Creatinine, Ser: 1.16 mg/dL (ref 0.61–1.24)
GFR calc Af Amer: >60 mL/min (ref >60)
GFR calc non Af Amer: >60 mL/min (ref >60)
GLUCOSE: 141 mg/dL — AB (ref 65–99)
Potassium: 3.7 mmol/L (ref 3.5–5.1)
Sodium: 140 mmol/L (ref 135–145)
Total Protein: 7.4 g/dL (ref 6.5–8.1)

## 2015-03-29 MED ORDER — TAMSULOSIN HCL 0.4 MG PO CAPS: ORAL_CAPSULE | ORAL | Status: DC

## 2015-03-29 MED ORDER — ONDANSETRON 4 MG PO TBDP: 4.0000 mg | ORAL_TABLET | Freq: Three times a day (TID) | ORAL | Status: DC | PRN

## 2015-03-29 MED ORDER — OXYCODONE-ACETAMINOPHEN 5-325 MG PO TABS: ORAL_TABLET | ORAL | Status: DC

## 2015-03-29 NOTE — Discharge Instructions (Signed)
Drink plenty of fluids. Take the medications as prescribed. Call Dr Dahlstedt's office to get a follow up appointment with him. You have a "4x2.5x3 mm stone about 2.4 cm proximal to the right UVJ". If you get a fever, or have uncontrolled pain or vomiting go to [facility] (the Urologists do all their work at [facility]).    Ureteral Colic Ureteral colic is spasm-like pain from the kidney or the ureter. This is often caused by a kidney stone. The pain is caused by the stone trying to get through the tubes that pass your pee. HOME CARE   Drink enough fluids to keep your pee (urine) clear or pale yellow.  Strain all your pee. A strainer will be provided. Keep anything caught in the [place] and bring it to your doctor. The stone causing the pain may be very small.  Only take medicine as told by your doctor.  Follow up with your doctor as told. GET HELP RIGHT AWAY IF:   Pain is not controlled with medicine.  Pain continues or gets worse.  The pain changes and there is chest or belly (abdominal) pain.  You pass out (faint).  You cannot pee.  You keep throwing up (vomiting).  You have a temperature by mouth above 102 F (38.9 C), not controlled by medicine. MAKE SURE YOU:   Understand these instructions.  Will watch this condition.  Will get help right away if you are not doing well or get worse. Document Released: 01/16/2008 Document Revised: 10/22/2011 Document Reviewed: 01/16/2008 [facility] Patient Information 2015 [place], [place]. This information is not intended to replace advice given to you by your health care provider. Make sure you discuss any questions you have with your health care provider.

## 2015-03-29 NOTE — ED Notes (Signed)
Pt verbalizes understanding of d/c instructions and denies any further needs at this time. 

## 2015-04-12 ENCOUNTER — Ambulatory Visit (HOSPITAL_COMMUNITY)
Admission: RE | Admit: 2015-04-12 | Discharge: 2015-04-12 | Disposition: A | Discharge status: Home / Self Care | Payer: 59 | Source: Ambulatory Visit | Attending: Urology | Admitting: Urology

## 2015-04-12 ENCOUNTER — Ambulatory Visit (INDEPENDENT_AMBULATORY_CARE_PROVIDER_SITE_OTHER): Payer: 59 | Admitting: Urology

## 2015-04-12 ENCOUNTER — Other Ambulatory Visit: Payer: Self-pay | Admitting: Urology

## 2015-04-12 DIAGNOSIS — N201 Calculus of ureter: Secondary | ICD-10-CM

## 2015-04-12 DIAGNOSIS — N200 Calculus of kidney: Secondary | ICD-10-CM | POA: Diagnosis not present

## 2015-07-18 ENCOUNTER — Telehealth: Payer: Self-pay | Admitting: Pulmonary Disease

## 2015-07-18 MED ORDER — CLINDAMYCIN HCL 300 MG PO CAPS: 600.0000 mg | ORAL_CAPSULE | Freq: Two times a day (BID) | ORAL | Status: DC

## 2015-07-18 NOTE — Telephone Encounter (Signed)
Patient Returned call  [phone]

## 2015-07-18 NOTE — Telephone Encounter (Signed)
Pt returning call.Stanley A Dalton ° °

## 2015-07-18 NOTE — Telephone Encounter (Signed)
Patient Returned call  [phone]

## 2015-07-18 NOTE — Telephone Encounter (Signed)
Spoke with pt. Requesting ABX for sinus infection. C/o nasal cong, PND, sore throat, very little dry cough, low grade temp of 99.5. Please advise Dr. Elsworth Soho thanks

## 2015-07-18 NOTE — Telephone Encounter (Signed)
augmentin 875 bid x 7days 

## 2015-07-18 NOTE — Telephone Encounter (Signed)
lmtcb X2 for pt.  

## 2015-07-18 NOTE — Telephone Encounter (Signed)
Medication sent to pharmacy Pt called and notified of medication change  Nothing further is needed at this time.

## 2015-07-18 NOTE — Telephone Encounter (Signed)
Patient returned call, may be reached at [phone].

## 2015-07-18 NOTE — Telephone Encounter (Signed)
Clindamycin 600 bid x 7 days

## 2015-07-18 NOTE — Telephone Encounter (Signed)
Pt reports he is allergic to PCN and was told not to take anything that contains amoxicillin either. Wants something diff called in. Please advise RA thanks

## 2015-07-18 NOTE — Telephone Encounter (Signed)
LMTCB x1 for pt.  

## 2015-07-18 NOTE — Telephone Encounter (Signed)
lmtcb X1 for pt  

## 2015-07-18 NOTE — Telephone Encounter (Signed)
lmtcb X3 for pt. 

## 2015-08-17 DIAGNOSIS — E669 Obesity, unspecified: Secondary | ICD-10-CM | POA: Diagnosis not present

## 2015-08-17 DIAGNOSIS — Z0000 Encounter for general adult medical examination without abnormal findings: Secondary | ICD-10-CM | POA: Diagnosis not present

## 2015-08-17 DIAGNOSIS — I10 Essential (primary) hypertension: Secondary | ICD-10-CM | POA: Diagnosis not present

## 2015-08-17 DIAGNOSIS — E782 Mixed hyperlipidemia: Secondary | ICD-10-CM | POA: Diagnosis not present

## 2015-08-17 DIAGNOSIS — J4520 Mild intermittent asthma, uncomplicated: Secondary | ICD-10-CM | POA: Diagnosis not present

## 2015-08-17 DIAGNOSIS — Z6838 Body mass index (BMI) 38.0-38.9, adult: Secondary | ICD-10-CM | POA: Diagnosis not present

## 2015-08-17 DIAGNOSIS — G4733 Obstructive sleep apnea (adult) (pediatric): Secondary | ICD-10-CM | POA: Diagnosis not present

## 2015-08-17 DIAGNOSIS — K219 Gastro-esophageal reflux disease without esophagitis: Secondary | ICD-10-CM | POA: Diagnosis not present

## 2015-08-17 MED FILL — NexIUM 40 MG CPDR: 40 | 90 days supply | Qty: 90 | Fill #3

## 2015-08-17 MED FILL — LOSARTAN-HCTZ 50-12.5 MG TA: 50-12.5 | 90 days supply | Qty: 90 | Fill #1

## 2015-08-24 DIAGNOSIS — D0422 Carcinoma in situ of skin of left ear and external auricular canal: Secondary | ICD-10-CM | POA: Diagnosis not present

## 2015-08-24 DIAGNOSIS — L570 Actinic keratosis: Secondary | ICD-10-CM | POA: Diagnosis not present

## 2015-08-24 DIAGNOSIS — D0421 Carcinoma in situ of skin of right ear and external auricular canal: Secondary | ICD-10-CM | POA: Diagnosis not present

## 2015-10-04 MED FILL — GAVILYTE-N SOLUTION: 420 | 1 days supply | Qty: 4000 | Fill #0

## 2015-10-07 ENCOUNTER — Other Ambulatory Visit: Payer: Self-pay | Admitting: Gastroenterology

## 2015-10-07 DIAGNOSIS — K5730 Diverticulosis of large intestine without perforation or abscess without bleeding: Secondary | ICD-10-CM | POA: Diagnosis not present

## 2015-10-07 DIAGNOSIS — Z86010 Personal history of colonic polyps: Secondary | ICD-10-CM | POA: Diagnosis not present

## 2015-10-07 DIAGNOSIS — D126 Benign neoplasm of colon, unspecified: Secondary | ICD-10-CM | POA: Diagnosis not present

## 2015-10-07 DIAGNOSIS — D120 Benign neoplasm of cecum: Secondary | ICD-10-CM | POA: Diagnosis not present

## 2015-10-17 ENCOUNTER — Encounter (HOSPITAL_COMMUNITY): Payer: Self-pay | Admitting: *Deleted

## 2015-10-17 ENCOUNTER — Emergency Department (HOSPITAL_COMMUNITY)
Admission: EM | Admit: 2015-10-17 | Discharge: 2015-10-17 | Disposition: A | Discharge status: Home / Self Care | Payer: 59 | Attending: Emergency Medicine | Admitting: Emergency Medicine

## 2015-10-17 ENCOUNTER — Emergency Department (HOSPITAL_COMMUNITY): Payer: 59

## 2015-10-17 DIAGNOSIS — Z87442 Personal history of urinary calculi: Secondary | ICD-10-CM | POA: Diagnosis not present

## 2015-10-17 DIAGNOSIS — Z79899 Other long term (current) drug therapy: Secondary | ICD-10-CM | POA: Insufficient documentation

## 2015-10-17 DIAGNOSIS — I10 Essential (primary) hypertension: Secondary | ICD-10-CM | POA: Insufficient documentation

## 2015-10-17 DIAGNOSIS — Z87891 Personal history of nicotine dependence: Secondary | ICD-10-CM | POA: Diagnosis not present

## 2015-10-17 DIAGNOSIS — J45909 Unspecified asthma, uncomplicated: Secondary | ICD-10-CM | POA: Diagnosis not present

## 2015-10-17 DIAGNOSIS — K5732 Diverticulitis of large intestine without perforation or abscess without bleeding: Secondary | ICD-10-CM | POA: Insufficient documentation

## 2015-10-17 DIAGNOSIS — R1032 Left lower quadrant pain: Secondary | ICD-10-CM | POA: Diagnosis present

## 2015-10-17 DIAGNOSIS — N200 Calculus of kidney: Secondary | ICD-10-CM | POA: Diagnosis not present

## 2015-10-17 DIAGNOSIS — Z880 Allergy status to penicillin: Secondary | ICD-10-CM | POA: Insufficient documentation

## 2015-10-17 LAB — URINALYSIS, ROUTINE W REFLEX MICROSCOPIC
BILIRUBIN URINE: NEGATIVE
Glucose, UA: NEGATIVE mg/dL
Hgb urine dipstick: NEGATIVE
KETONES UR: NEGATIVE mg/dL
LEUKOCYTES UA: NEGATIVE
Nitrite: NEGATIVE
Protein, ur: NEGATIVE mg/dL
Specific Gravity, Urine: <1.005 — ABNORMAL LOW (ref 1.005–1.030)
pH: 5.5 (ref 5.0–8.0)

## 2015-10-17 LAB — COMPREHENSIVE METABOLIC PANEL
ALT: 59 U/L (ref 17–63)
ANION GAP: 9 (ref 5–15)
AST: 36 U/L (ref 15–41)
Albumin: 4.5 g/dL (ref 3.5–5.0)
Alkaline Phosphatase: 64 U/L (ref 38–126)
BILIRUBIN TOTAL: 0.9 mg/dL (ref 0.3–1.2)
BUN: 10 mg/dL (ref 6–20)
CO2: 27 mmol/L (ref 22–32)
Calcium: 9.3 mg/dL (ref 8.9–10.3)
Chloride: 103 mmol/L (ref 101–111)
Creatinine, Ser: 0.85 mg/dL (ref 0.61–1.24)
GFR calc Af Amer: >60 mL/min (ref >60)
GLUCOSE: 93 mg/dL (ref 65–99)
Potassium: 3.6 mmol/L (ref 3.5–5.1)
Sodium: 139 mmol/L (ref 135–145)
TOTAL PROTEIN: 7.4 g/dL (ref 6.5–8.1)

## 2015-10-17 LAB — CBC
HCT: 43.5 % (ref 39.0–52.0)
HEMOGLOBIN: 15.3 g/dL (ref 13.0–17.0)
MCH: 30.8 pg (ref 26.0–34.0)
MCHC: 35.2 g/dL (ref 30.0–36.0)
MCV: 87.5 fL (ref 78.0–100.0)
Platelets: 167 10*3/uL (ref 150–400)
RBC: 4.97 MIL/uL (ref 4.22–5.81)
RDW: 13 % (ref 11.5–15.5)
WBC: 10.7 10*3/uL — AB (ref 4.0–10.5)

## 2015-10-17 LAB — LIPASE, BLOOD: Lipase: 40 U/L (ref 11–51)

## 2015-10-17 MED ORDER — CIPROFLOXACIN HCL 500 MG PO TABS: 500.0000 mg | ORAL_TABLET | Freq: Two times a day (BID) | ORAL | Status: DC

## 2015-10-17 MED ORDER — SODIUM CHLORIDE 0.9 % IV SOLN: INTRAVENOUS | Status: DC

## 2015-10-17 MED ORDER — ONDANSETRON HCL 4 MG PO TABS: 4.0000 mg | ORAL_TABLET | Freq: Four times a day (QID) | ORAL | Status: DC

## 2015-10-17 MED ORDER — CIPROFLOXACIN IN D5W 400 MG/200ML IV SOLN
400.0000 mg | Freq: Two times a day (BID) | INTRAVENOUS | Status: DC
  Administered 2015-10-17: 400 mg via INTRAVENOUS
  Filled 2015-10-17: qty 200

## 2015-10-17 MED ORDER — HYDROCODONE-ACETAMINOPHEN 5-325 MG PO TABS: 1.0000 | ORAL_TABLET | ORAL | Status: DC | PRN

## 2015-10-17 MED ORDER — SODIUM CHLORIDE 0.9 % IV BOLUS (SEPSIS)
1000.0000 mL | Freq: Once | INTRAVENOUS | Status: AC
  Administered 2015-10-17: 1000 mL via INTRAVENOUS

## 2015-10-17 MED ORDER — METRONIDAZOLE IN NACL 5-0.79 MG/ML-% IV SOLN
500.0000 mg | Freq: Once | INTRAVENOUS | Status: DC
  Filled 2015-10-17: qty 100

## 2015-10-17 MED ORDER — METRONIDAZOLE 500 MG PO TABS
500.0000 mg | ORAL_TABLET | Freq: Once | ORAL | Status: AC
  Administered 2015-10-17: 500 mg via ORAL
  Filled 2015-10-17: qty 1

## 2015-10-17 MED ORDER — ONDANSETRON HCL 4 MG/2ML IJ SOLN
4.0000 mg | Freq: Once | INTRAMUSCULAR | Status: AC
  Administered 2015-10-17: 4 mg via INTRAVENOUS
  Filled 2015-10-17: qty 2

## 2015-10-17 MED ORDER — HYDROMORPHONE HCL 1 MG/ML IJ SOLN
1.0000 mg | INTRAMUSCULAR | Status: DC | PRN
  Administered 2015-10-17 (×2): 1 mg via INTRAVENOUS
  Filled 2015-10-17 (×2): qty 1

## 2015-10-17 MED ORDER — IOHEXOL 300 MG/ML  SOLN
100.0000 mL | Freq: Once | INTRAMUSCULAR | Status: AC | PRN
  Administered 2015-10-17: 100 mL via INTRAVENOUS

## 2015-10-17 MED ORDER — METRONIDAZOLE 500 MG PO TABS: 500.0000 mg | ORAL_TABLET | Freq: Two times a day (BID) | ORAL | Status: DC

## 2015-10-17 NOTE — Discharge Instructions (Signed)
Diverticulitis  Diverticulitis is when small pockets that have formed in your colon (large intestine) become infected or swollen.  HOME CARE  · Follow your doctor's instructions.  · Follow a special diet if told by your doctor.  · When you feel better, your doctor may tell you to change your diet. You may be told to eat a lot of fiber. Fruits and vegetables are good sources of fiber. Fiber makes it easier to poop (have bowel movements).  · Take supplements or probiotics as told by your doctor.  · Only take medicines as told by your doctor.  · Keep all follow-up visits with your doctor.  GET HELP IF:  · Your pain does not get better.  · You have a hard time eating food.  · You are not pooping like normal.  GET HELP RIGHT AWAY IF:  · Your pain gets worse.  · Your problems do not get better.  · Your problems suddenly get worse.  · You have a fever.  · You keep throwing up (vomiting).  · You have bloody or black, tarry poop (stool).  MAKE SURE YOU:   · Understand these instructions.  · Will watch your condition.  · Will get help right away if you are not doing well or get worse.     This information is not intended to replace advice given to you by your health care provider. Make sure you discuss any questions you have with your health care provider.     Document Released: 01/16/2008 Document Revised: 08/04/2013 Document Reviewed: 06/24/2013  Elsevier Interactive Patient Education ©2016 Elsevier Inc[employer]

## 2015-10-17 NOTE — ED Notes (Signed)
Pt comes in with left lower abdominal pain starting when he woke up today; states it has progressively gotten worse throughout the day. Pt denies any n/v/d.

## 2015-10-17 NOTE — ED Provider Notes (Signed)
CSN: [id number]     Arrival date & time 10/17/15  1623 History   First MD Initiated Contact with Patient 10/17/15 1806     Chief Complaint  Patient presents with  . Abdominal Pain   Patient is a 50 y.o. male presenting with abdominal pain. The history is provided by the patient.  Abdominal Pain Pain location:  LLQ Pain quality: aching and sharp   Pain severity:  Moderate Onset quality:  Gradual Duration:  1 day Timing:  Constant Progression:  Worsening Chronicity:  New Relieved by:  Nothing Worsened by:  Movement and palpation Associated symptoms: no anorexia, no diarrhea, no dysuria, no fever, no nausea and no vomiting     Past Medical History  Diagnosis Date  . Hypertension   . Asthma     Dr. Laurann Montana  . OSA (obstructive sleep apnea)   . Renal disorder   . Kidney stones    History reviewed. No pertinent past surgical history. Family History  Problem Relation Age of Onset  . Hypertension Mother   . Colon cancer Mother   . Heart disease Father    Social History  Substance Use Topics  . Smoking status: Former Smoker -- 1.00 packs/day for 1 years    Types: Cigarettes    Quit date: 08/14/1987  . Smokeless tobacco: Current User    Types: Chew     Comment: dip  . Alcohol Use: Yes     Comment: occ.    Review of Systems  Constitutional: Negative for fever.  Gastrointestinal: Positive for abdominal pain. Negative for nausea, vomiting, diarrhea and anorexia.  Genitourinary: Negative for dysuria.  All other systems reviewed and are negative.     Allergies  Neosporin; Penicillins; and Tramadol hcl  Home Medications   Prior to Admission medications   Medication Sig Start Date End Date Taking? Authorizing Provider  albuterol (PROVENTIL HFA;VENTOLIN HFA) 108 (90 BASE) MCG/ACT inhaler Inhale 2 puffs into the lungs every 6 (six) hours as needed for wheezing or shortness of breath.   Yes Historical Provider, MD  esomeprazole (NEXIUM) 40 MG capsule Take 40 mg by mouth  every evening.    Yes Historical Provider, MD  losartan-hydrochlorothiazide (HYZAAR) 50-12.5 MG per tablet Take 1 tablet by mouth daily.   Yes Historical Provider, MD  mometasone (NASONEX) 50 MCG/ACT nasal spray Place 2 sprays into the nose daily.   Yes Historical Provider, MD  Multiple Vitamin (MULTIVITAMIN) tablet Take 1 tablet by mouth daily.   Yes Historical Provider, MD  ciprofloxacin (CIPRO) 500 MG tablet Take 1 tablet (500 mg total) by mouth every 12 (twelve) hours. 10/17/15   Dorie Rank, MD  HYDROcodone-acetaminophen (NORCO/VICODIN) 5-325 MG tablet Take 1-2 tablets by mouth every 4 (four) hours as needed. 10/17/15   Dorie Rank, MD  metroNIDAZOLE (FLAGYL) 500 MG tablet Take 1 tablet (500 mg total) by mouth 2 (two) times daily. 10/17/15   Dorie Rank, MD  ondansetron (ZOFRAN ODT) 4 MG disintegrating tablet Take 1 tablet (4 mg total) by mouth every 8 (eight) hours as needed for nausea or vomiting. Patient not taking: Reported on 10/17/2015 03/29/15   Rolland Porter, MD  ondansetron (ZOFRAN) 4 MG tablet Take 1 tablet (4 mg total) by mouth every 6 (six) hours. 10/17/15   Dorie Rank, MD  oxyCODONE-acetaminophen (ROXICET) 5-325 MG per tablet Take 1 or 2 po Q 6hrs for pain Patient not taking: Reported on 10/17/2015 03/29/15   Rolland Porter, MD  tamsulosin (FLOMAX) 0.4 MG CAPS capsule Take 1 po  QD until you pass the stone. Patient not taking: Reported on 10/17/2015 03/29/15   Rolland Porter, MD   BP 122/91 mmHg  Pulse 68  Temp(Src) 98.4 F (36.9 C) (Oral)  Resp 18  Ht 5\' 10"  (1.778 m)  Wt 113.399 kg  BMI 35.87 kg/m2  SpO2 95% Physical Exam  Constitutional: He appears well-developed and well-nourished. No distress.  HENT:  Head: Normocephalic and atraumatic.  Right Ear: External ear normal.  Left Ear: External ear normal.  Eyes: Conjunctivae are normal. Right eye exhibits no discharge. Left eye exhibits no discharge. No scleral icterus.  Neck: Neck supple. No tracheal deviation present.  Cardiovascular: Normal rate,  regular rhythm and intact distal pulses.   Pulmonary/Chest: Effort normal and breath sounds normal. No stridor. No respiratory distress. He has no wheezes. He has no rales.  Abdominal: Soft. Bowel sounds are normal. He exhibits no distension. There is tenderness in the left upper quadrant. There is guarding. There is no rigidity and no rebound. No hernia.  Musculoskeletal: He exhibits no edema or tenderness.  Neurological: He is alert. He has normal strength. No cranial nerve deficit (no facial droop, extraocular movements intact, no slurred speech) or sensory deficit. He exhibits normal muscle tone. He displays no seizure activity. Coordination normal.  Skin: Skin is warm and dry. No rash noted.  Psychiatric: He has a normal mood and affect.  Nursing note and vitals reviewed.   ED Course  Procedures (including critical care time) Labs Review Labs Reviewed  CBC - Abnormal; Notable for the following:    WBC 10.7 (*)    All other components within normal limits  URINALYSIS, ROUTINE W REFLEX MICROSCOPIC (NOT AT [facility]) - Abnormal; Notable for the following:    APPearance HAZY (*)    Specific Gravity, Urine <1.005 (*)    All other components within normal limits  LIPASE, BLOOD  COMPREHENSIVE METABOLIC PANEL    Imaging Review Ct Abdomen Pelvis W Contrast  10/17/2015  CLINICAL DATA:  Patient with left lower abdominal pain, worsening throughout the day. EXAM: CT ABDOMEN AND PELVIS WITH CONTRAST TECHNIQUE: Multidetector CT imaging of the abdomen and pelvis was performed using the standard protocol following bolus administration of intravenous contrast. CONTRAST:  193mL OMNIPAQUE IOHEXOL 300 MG/ML  SOLN COMPARISON:  CT abdomen 04/12/2015 ; 04/28/2014. FINDINGS: Lower chest: Normal heart size. Dependent atelectasis within the bilateral lower lobes. Unchanged 6 mm nodule within the left lower lobe (image 9; series 6). Hepatobiliary: Liver is normal in size and contour. No focal hepatic lesions  identified. Gallbladder is unremarkable. No intrahepatic or extrahepatic biliary ductal dilatation. Pancreas: Unremarkable Spleen: Unremarkable Adrenals/Urinary Tract: Normal adrenal glands. Kidneys enhance symmetrically with contrast. No hydronephrosis. 3 mm nonobstructing stone inferior pole left kidney and 3 mm nonobstructing stone inferior pole right kidney. Urinary bladder is unremarkable. No ureterolithiasis. Stomach/Bowel: Sigmoid colonic diverticulosis. There is a small amount of fat stranding about the descending colon (image 62; series 2), suggestive of mild acute diverticulitis. The appendix is normal. Normal morphology of the stomach. No evidence for bowel obstruction. No free fluid or free intraperitoneal air. Vascular/Lymphatic: Normal caliber abdominal aorta. No retroperitoneal lymphadenopathy. Other: Small fat containing right inguinal hernia. Prostate unremarkable. Musculoskeletal: Lumbar spine degenerative changes. No aggressive or acute appearing osseous lesions. IMPRESSION: Findings most compatible with mild diverticulitis. Nonobstructing 3 mm stones inferior pole of the right and left kidneys. Unchanged 6 mm left lower lobe pulmonary nodule. Recommend 1 additional follow-up chest CT in 12 months to document 2 years of  stability. Electronically Signed   By: Lovey Newcomer M.D.   On: 10/17/2015 19:49   I have personally reviewed and evaluated these images and lab results as part of my medical decision-making.   MDM   Final diagnoses:  Diverticulitis of large intestine without perforation or abscess without bleeding     patient's CT scan shows mild diverticulitis. He does not have any evidence of abscess or perforation. Patient was given IV antibiotics in the emergency room. He did have 1  Episode of vomiting after the pain medications but otherwise has been tolerating oral medications. He is comfortable with discharge and will follow up with his  Gastroenterologist.    Dorie Rank,  MD 10/17/15 2154

## 2015-10-18 MED FILL — ONDANSETRON HCL 4 MG TABLET: 4 | 3 days supply | Qty: 12 | Fill #0

## 2015-10-18 MED FILL — metroNIDAZOLE 500 MG TABS: 500 | 10 days supply | Qty: 20 | Fill #0

## 2015-10-18 MED FILL — CIPROFLOXACIN HCL 500 MG TA: 500 | 10 days supply | Qty: 20 | Fill #0

## 2015-10-18 MED FILL — HYDROCODON-APAP 5-325: 5-325 | 2 days supply | Qty: 20 | Fill #0

## 2015-10-20 DIAGNOSIS — K5732 Diverticulitis of large intestine without perforation or abscess without bleeding: Secondary | ICD-10-CM | POA: Diagnosis not present

## 2015-10-29 DIAGNOSIS — B349 Viral infection, unspecified: Secondary | ICD-10-CM | POA: Diagnosis not present

## 2015-10-29 DIAGNOSIS — R509 Fever, unspecified: Secondary | ICD-10-CM | POA: Diagnosis not present

## 2015-11-01 DIAGNOSIS — J069 Acute upper respiratory infection, unspecified: Secondary | ICD-10-CM | POA: Diagnosis not present

## 2015-11-23 MED FILL — LOSARTAN-HCTZ 50-12.5 MG TA: 50-12.5 | 90 days supply | Qty: 90 | Fill #2

## 2015-11-23 MED FILL — ESOMEPRAZOLE MAG DR 40 MG C: 40 | 90 days supply | Qty: 90 | Fill #0

## 2015-12-13 DIAGNOSIS — L570 Actinic keratosis: Secondary | ICD-10-CM | POA: Diagnosis not present

## 2015-12-13 DIAGNOSIS — L821 Other seborrheic keratosis: Secondary | ICD-10-CM | POA: Diagnosis not present

## 2015-12-13 DIAGNOSIS — L814 Other melanin hyperpigmentation: Secondary | ICD-10-CM | POA: Diagnosis not present

## 2015-12-29 DIAGNOSIS — M7752 Other enthesopathy of left foot: Secondary | ICD-10-CM | POA: Diagnosis not present

## 2015-12-29 DIAGNOSIS — M19072 Primary osteoarthritis, left ankle and foot: Secondary | ICD-10-CM | POA: Diagnosis not present

## 2015-12-29 DIAGNOSIS — M67472 Ganglion, left ankle and foot: Secondary | ICD-10-CM | POA: Diagnosis not present

## 2015-12-29 DIAGNOSIS — T148 Other injury of unspecified body region: Secondary | ICD-10-CM | POA: Diagnosis not present

## 2016-01-02 ENCOUNTER — Encounter: Payer: Self-pay | Admitting: Adult Health

## 2016-01-02 ENCOUNTER — Ambulatory Visit (INDEPENDENT_AMBULATORY_CARE_PROVIDER_SITE_OTHER): Payer: 59 | Admitting: Adult Health

## 2016-01-02 VITALS — BP 132/88 | HR 70 | Temp 98.1°F | Ht 69.0 in | Wt 253.0 lb

## 2016-01-02 DIAGNOSIS — J45991 Cough variant asthma: Secondary | ICD-10-CM

## 2016-01-02 MED ORDER — AZITHROMYCIN 250 MG PO TABS: ORAL_TABLET | ORAL | Status: AC

## 2016-01-02 MED ORDER — LEVALBUTEROL HCL 0.63 MG/3ML IN NEBU
0.6300 mg | INHALATION_SOLUTION | Freq: Once | RESPIRATORY_TRACT | Status: AC
  Administered 2016-01-02: 0.63 mg via RESPIRATORY_TRACT

## 2016-01-02 MED ORDER — PREDNISONE 10 MG PO TABS: ORAL_TABLET | ORAL | Status: DC

## 2016-01-02 MED FILL — AZITHROMYCIN 250 MG TABLET: 250 | 5 days supply | Qty: 6 | Fill #0

## 2016-01-02 MED FILL — predniSONE 10 MG TABS: 10 | 8 days supply | Qty: 20 | Fill #0

## 2016-01-02 NOTE — Assessment & Plan Note (Signed)
Flare with URI  xopenex neb x 1   Plan  Z-Pak take as directed Prednisone taper over next week.  Mucinex DM twice daily as needed for cough and congestion Saline nasal rinses  Zyrtec 10mg  At bedtime  As needed  Drainage  Fluids and rest  Follow up Dr. Elsworth Soho  3 months and As needed   Please contact office for sooner follow up if symptoms do not improve or worsen or seek emergency care

## 2016-01-02 NOTE — Patient Instructions (Addendum)
Z-Pak take as directed Prednisone taper over next week.  Mucinex DM twice daily as needed for cough and congestion Saline nasal rinses  Zyrtec 10mg  At bedtime  As needed  Drainage  Fluids and rest  Follow up Dr. Elsworth Soho  3 months and As needed   Please contact office for sooner follow up if symptoms do not improve or worsen or seek emergency care

## 2016-01-02 NOTE — Addendum Note (Signed)
Addended by: Osa Craver on: 01/02/2016 12:06 PM   Modules accepted: Orders

## 2016-01-02 NOTE — Progress Notes (Signed)
Subjective:    Patient ID: [name], male    DOB: January 18, 1966, 50 y.o.   MRN: [id number]  HPI 50 yo male former smoker with cough variant asthma vs upper airway cough syndrome   TEST  5/ 2012 2-D echocardiogram showed mild concentric LVH, normal systolic function.  A nuclear stress test showed no inducible or reversible ischemia, no fixed defects. He does have a history of obstructive sleep apnea and did not tolerate CPAP therapy, oral appliance was apparently not covered by insurance.  Spirometry 10/2013  showed mild restriction with normal ratio, FEV1 73% -2.87, FVC 3.59 -71%  01/02/2016 Acute Office Visit.  Pt presents for an acute office visit.  Complains of sinus pressure/drainage, chest congestion/tightness, non prod cough, SOB and wheezing x 1 week. Denies any bodyaches, chills, fever, nausea or vomiting, hemoptysis. No abx or travel recently.  Not feeling any better. Hard to get up thick mucus. Wheezing is getting worse.  Tried claritin , cold meds with not help.     Past Medical History  Diagnosis Date  . Hypertension   . Asthma     Dr. Laurann Montana  . OSA (obstructive sleep apnea)   . Renal disorder   . Kidney stones    Current Outpatient Prescriptions on File Prior to Visit  Medication Sig Dispense Refill  . albuterol (PROVENTIL HFA;VENTOLIN HFA) 108 (90 BASE) MCG/ACT inhaler Inhale 2 puffs into the lungs every 6 (six) hours as needed for wheezing or shortness of breath.    . esomeprazole (NEXIUM) 40 MG capsule Take 40 mg by mouth every evening.     Marland Kitchen losartan-hydrochlorothiazide (HYZAAR) 50-12.5 MG per tablet Take 1 tablet by mouth daily.    . mometasone (NASONEX) 50 MCG/ACT nasal spray Place 2 sprays into the nose daily.    . Multiple Vitamin (MULTIVITAMIN) tablet Take 1 tablet by mouth daily.    Marland Kitchen HYDROcodone-acetaminophen (NORCO/VICODIN) 5-325 MG tablet Take 1-2 tablets by mouth every 4 (four) hours as needed. (Patient not taking: Reported on 01/02/2016) 20 tablet  0  . metroNIDAZOLE (FLAGYL) 500 MG tablet Take 1 tablet (500 mg total) by mouth 2 (two) times daily. (Patient not taking: Reported on 01/02/2016) 20 tablet 0  . ondansetron (ZOFRAN ODT) 4 MG disintegrating tablet Take 1 tablet (4 mg total) by mouth every 8 (eight) hours as needed for nausea or vomiting. (Patient not taking: Reported on 10/17/2015) 8 tablet 0   No current facility-administered medications on file prior to visit.      Review of Systems .Constitutional:   No  weight loss, night sweats,  + Fevers, chills, fatigue, or  lassitude.  HEENT:   No headaches,  Difficulty swallowing,  Tooth/dental problems, or  Sore throat,                No sneezing, itching, ear ache,  +nasal congestion, post nasal drip,   CV:  No chest pain,  Orthopnea, PND, swelling in lower extremities, anasarca, dizziness, palpitations, syncope.   GI  No heartburn, indigestion, abdominal pain, nausea, vomiting, diarrhea, change in bowel habits, loss of appetite, bloody stools.   Resp:    No chest wall deformity  Skin: no rash or lesions.  GU: no dysuria, change in color of urine, no urgency or frequency.  No flank pain, no hematuria   MS:  No joint pain or swelling.  No decreased range of motion.  No back pain.  Psych:  No change in mood or affect. No depression or anxiety.  No memory loss.         Objective:   Physical Exam Filed Vitals:   01/02/16 1138  BP: 132/88  Pulse: 70  Temp: 98.1 F (36.7 C)  TempSrc: Oral  Height: 5\' 9"  (1.753 m)  Weight: 253 lb (114.76 kg)  SpO2: 98%   GEN: A/Ox3; pleasant , NAD   HEENT:  [place]/AT,  EACs-clear, TMs-wnl, NOSE-clear, THROAT-clear, no lesions, no postnasal drip or exudate noted.   NECK:  Supple w/ fair ROM; no JVD; normal carotid impulses w/o bruits; no thyromegaly or nodules palpated; no lymphadenopathy.  RESP  Few exp wheezing  No rales/ or rhonchi.no accessory muscle use, no dullness to percussion  CARD:  RRR, no m/r/g  , no peripheral edema,  pulses intact, no cyanosis or clubbing.  GI:   Soft & nt; nml bowel sounds; no organomegaly or masses detected.  Musco: Warm bil, no deformities or joint swelling noted.   Neuro: alert, no focal deficits noted.    Skin: Warm, no lesions or rashes  Jabron Weese NP-C  [facility]  01/02/2016        Assessment & Plan:

## 2016-01-04 NOTE — Progress Notes (Signed)
Reviewed & agree with plan  

## 2016-01-05 DIAGNOSIS — M67472 Ganglion, left ankle and foot: Secondary | ICD-10-CM | POA: Diagnosis not present

## 2016-01-05 DIAGNOSIS — M84374A Stress fracture, right foot, initial encounter for fracture: Secondary | ICD-10-CM | POA: Diagnosis not present

## 2016-01-05 DIAGNOSIS — M7752 Other enthesopathy of left foot: Secondary | ICD-10-CM | POA: Diagnosis not present

## 2016-01-19 DIAGNOSIS — M84374D Stress fracture, right foot, subsequent encounter for fracture with routine healing: Secondary | ICD-10-CM | POA: Diagnosis not present

## 2016-01-19 DIAGNOSIS — M7752 Other enthesopathy of left foot: Secondary | ICD-10-CM | POA: Diagnosis not present

## 2016-01-19 DIAGNOSIS — M67472 Ganglion, left ankle and foot: Secondary | ICD-10-CM | POA: Diagnosis not present

## 2016-02-02 DIAGNOSIS — M84374D Stress fracture, right foot, subsequent encounter for fracture with routine healing: Secondary | ICD-10-CM | POA: Diagnosis not present

## 2016-02-13 DIAGNOSIS — K219 Gastro-esophageal reflux disease without esophagitis: Secondary | ICD-10-CM | POA: Diagnosis not present

## 2016-02-13 DIAGNOSIS — I10 Essential (primary) hypertension: Secondary | ICD-10-CM | POA: Diagnosis not present

## 2016-02-13 DIAGNOSIS — G4733 Obstructive sleep apnea (adult) (pediatric): Secondary | ICD-10-CM | POA: Diagnosis not present

## 2016-02-22 MED FILL — NexIUM 40 MG CPDR: 40 | 30 days supply | Qty: 30 | Fill #1

## 2016-02-22 MED FILL — LOSARTAN-HCTZ 50-12.5 MG TA: 50-12.5 | 90 days supply | Qty: 90 | Fill #3

## 2016-03-27 MED FILL — NexIUM 40 MG CPDR: 40 | 90 days supply | Qty: 90 | Fill #2

## 2016-04-09 ENCOUNTER — Encounter: Payer: Self-pay | Admitting: Pulmonary Disease

## 2016-04-09 ENCOUNTER — Ambulatory Visit (INDEPENDENT_AMBULATORY_CARE_PROVIDER_SITE_OTHER): Payer: 59 | Admitting: Pulmonary Disease

## 2016-04-09 DIAGNOSIS — J45991 Cough variant asthma: Secondary | ICD-10-CM | POA: Diagnosis not present

## 2016-04-09 NOTE — Progress Notes (Signed)
   Subjective:    Patient ID: [name], male    DOB: [birth year], 50 y.o.   MRN: [id number]  HPI  50 yo male former smoker with -Attacks of sinobronchitis at least once a year 04/09/2016  Chief Complaint  Patient presents with  . Follow-up    breathing is doing well, no concerns.   He had a sinobronchitis flare in 12/2015 and was treated with Z-Pak and prednisone this is now resolved He denies any intercurrent symptoms-he uses Nasonex only around, does not need albuterol for rescue. 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  A nuclear stress test showed no inducible or reversible ischemia, no fixed defects. He does have a history of obstructive sleep apnea and did not tolerate CPAP therapy, oral appliance was apparently not covered by insurance.  Spirometry 10/2013  showed mild restriction with normal ratio, FEV1 73% -2.87, FVC 3.59 -71%    Review of Systems neg for any significant sore throat, dysphagia, itching, sneezing, nasal congestion or excess/ purulent secretions, fever, chills, sweats, unintended wt loss, pleuritic or exertional cp, hempoptysis, orthopnea pnd or change in chronic leg swelling. Also denies presyncope, palpitations, heartburn, abdominal pain, nausea, vomiting, diarrhea or change in bowel or urinary habits, dysuria,hematuria, rash, arthralgias, visual complaints, headache, numbness weakness or ataxia.     Objective:   Physical Exam  Gen. Pleasant, well-nourished, in no distress ENT - no lesions, no post nasal drip Neck: No JVD, no thyromegaly, no carotid bruits Lungs: no use of accessory muscles, no dullness to percussion, clear without rales or rhonchi  Cardiovascular: Rhythm regular,  heart sounds  normal, no murmurs or gallops, no peripheral edema Musculoskeletal: No deformities, no cyanosis or clubbing         Assessment & Plan:

## 2016-04-09 NOTE — Patient Instructions (Signed)
   Call as needed Stay on Nexium and Nasonex

## 2016-04-09 NOTE — Assessment & Plan Note (Signed)
Call as needed for attacks of sinobronchitis-he generally resolves with a short course of prednisone and an antibiotic Stay on Nexium and Nasonex

## 2016-05-17 MED FILL — LOSARTAN-HCTZ 50-12.5 MG TA: 50-12.5 | 90 days supply | Qty: 90 | Fill #4

## 2016-06-26 MED FILL — NexIUM 40 MG CPDR: 40 | 90 days supply | Qty: 90 | Fill #3

## 2016-07-02 ENCOUNTER — Other Ambulatory Visit (INDEPENDENT_AMBULATORY_CARE_PROVIDER_SITE_OTHER): Payer: 59

## 2016-07-02 ENCOUNTER — Ambulatory Visit (INDEPENDENT_AMBULATORY_CARE_PROVIDER_SITE_OTHER): Payer: 59 | Admitting: Internal Medicine

## 2016-07-02 ENCOUNTER — Encounter: Payer: Self-pay | Admitting: Internal Medicine

## 2016-07-02 VITALS — BP 148/98 | HR 83 | Ht 70.0 in | Wt 253.0 lb

## 2016-07-02 DIAGNOSIS — J45991 Cough variant asthma: Secondary | ICD-10-CM

## 2016-07-02 DIAGNOSIS — I10 Essential (primary) hypertension: Secondary | ICD-10-CM

## 2016-07-02 LAB — CBC WITH DIFFERENTIAL/PLATELET
BASOS ABS: 0 10*3/uL (ref 0.0–0.1)
Basophils Relative: 0.2 % (ref 0.0–3.0)
Eosinophils Absolute: 0.1 10*3/uL (ref 0.0–0.7)
Eosinophils Relative: 1.3 % (ref 0.0–5.0)
HCT: 46 % (ref 39.0–52.0)
Hemoglobin: 15.8 g/dL (ref 13.0–17.0)
LYMPHS ABS: 3.1 10*3/uL (ref 0.7–4.0)
Lymphocytes Relative: 27.3 % (ref 12.0–46.0)
MCHC: 34.4 g/dL (ref 30.0–36.0)
MCV: 88.1 fl (ref 78.0–100.0)
MONO ABS: 0.6 10*3/uL (ref 0.1–1.0)
MONOS PCT: 5.3 % (ref 3.0–12.0)
NEUTROS PCT: 65.9 % (ref 43.0–77.0)
Neutro Abs: 7.5 10*3/uL (ref 1.4–7.7)
Platelets: 247 10*3/uL (ref 150.0–400.0)
RBC: 5.22 Mil/uL (ref 4.22–5.81)
RDW: 12.6 % (ref 11.5–15.5)
WBC: 11.4 10*3/uL — ABNORMAL HIGH (ref 4.0–10.5)

## 2016-07-02 MED ORDER — PREDNISONE 10 MG PO TABS: ORAL_TABLET | ORAL | 0 refills | Status: DC

## 2016-07-02 MED ORDER — AZITHROMYCIN 250 MG PO TABS: ORAL_TABLET | ORAL | 0 refills | Status: DC

## 2016-07-02 MED FILL — predniSONE 10 MG TABS: 10 | 6 days supply | Qty: 14 | Fill #0

## 2016-07-02 MED FILL — AZITHROMYCIN 250 MG TABLET: 250 | 5 days supply | Qty: 6 | Fill #0

## 2016-07-02 NOTE — Progress Notes (Signed)
Subjective:    Patient ID: [name], male    DOB: February 26, 1966,     MRN: [id number]  HPI  50 yo male former smoker with -Attacks of sinobronchitis at least once a year with AB/ GOLD 0 copd     Significant tests/ events  5/ 2012 2-D echocardiogram showed mild concentric LVH, normal systolic function.  A nuclear stress test showed no inducible or reversible ischemia, no fixed defects. He does have a history of obstructive sleep apnea and did not tolerate CPAP therapy, oral appliance was apparently not covered by insurance.  Spirometry 10/2013  showed mild restriction with normal ratio, FEV1 73% -2.87, FVC 3.59 -71%    07/02/2016  f/u ov/Wert re:  Chief Complaint  Patient presents with  . Acute Visit    Pt c/o cough and sinnus pressure x 1 week. Pt not getting much mucus up. Pt states that he feels chest congestion and tightness. Pt using Chlorpheniramine 4mg .   at baseline on hyzar / nasonex / nexium hs  As of one week prior to OV  Started h1 for sinus pressures/ drainage  First thing that usually triggers  The cough = sensation of  drainage   Throat  lozenges work the best  No sob/ min need for saba   No obvious day to day or daytime variability or assoc excess/ purulent sputum or mucus plugs or hemoptysis or cp or chest tightness, subjective wheeze  or hb symptoms. No unusual exp hx or h/o childhood pna/ asthma or knowledge of premature birth.  Sleeping ok without nocturnal  or early am exacerbation  of respiratory  c/o's or need for noct saba. Also denies any obvious fluctuation of symptoms with weather or environmental changes or other aggravating or alleviating factors except as outlined above   Current Medications, Allergies, Complete Past Medical History, Past Surgical History, Family History, and Social History were reviewed in [employer] record.  ROS  The following are not active complaints unless bolded sore throat, dysphagia, dental  problems, itching, sneezing,  nasal congestion or excess/ purulent secretions, ear ache,   fever, chills, sweats, unintended wt loss, classically pleuritic or exertional cp,  orthopnea pnd or leg swelling, presyncope, palpitations, abdominal pain, anorexia, nausea, vomiting, diarrhea  or change in bowel or bladder habits, change in stools or urine, dysuria,hematuria,  rash, arthralgias, visual complaints, headache, numbness, weakness or ataxia or problems with walking or coordination,  change in mood/affect or memory.             Objective:   Physical Exam  Obese amb wm with harsh almost violent throat clearing   Wt Readings from Last 3 Encounters:  07/02/16 253 lb (114.8 kg)  04/09/16 253 lb 12.8 oz (115.1 kg)  01/02/16 253 lb (114.8 kg)    Vital signs reviewed - Note on arrival 02 sats  95% on RA and BP 148/98      HEENT: nl dentition, turbinates, and oropharynx. Nl external ear canals without cough reflex   NECK :  without JVD/Nodes/TM/ nl carotid upstrokes bilaterally   LUNGS: no acc muscle use,  Nl contour chest which is clear to A and P bilaterally without cough on insp or exp maneuvers   CV:  RRR  no s3 or murmur or increase in P2, no edema   ABD:  soft and nontender with nl inspiratory excursion in the supine position. No bruits or organomegaly, bowel sounds nl  MS:  Nl gait/ ext warm without deformities,  calf tenderness, cyanosis or clubbing No obvious joint restrictions   SKIN: warm and dry without lesions    NEURO:  alert, approp, nl sensorium with  no motor deficits      Labs ordered 07/02/2016  Allergy profile        Assessment & Plan:

## 2016-07-02 NOTE — Patient Instructions (Addendum)
zpak / Prednisone 10 mg take  4 each am x 2 days,   2 each am x 2 days,  1 each am x 2 days and stop   If not all better after thxgiving,  Call [facility] [phone] and ask for sinus ct  GERD (REFLUX)  is an extremely common cause of respiratory symptoms just like yours , many times with no obvious heartburn at all.    It can be treated with medication, but also with lifestyle changes including elevation of the head of your bed (ideally with 6 inch  bed blocks),  Smoking cessation, avoidance of late meals, excessive alcohol, and avoid fatty foods, chocolate, peppermint, colas, red wine, and acidic juices such as orange juice.  NO MINT OR MENTHOL PRODUCTS SO NO COUGH DROPS   USE SUGARLESS CANDY INSTEAD (Jolley ranchers or Stover's or Life Savers) or even ice chips will also do - the key is to swallow to prevent all throat clearing. NO OIL BASED VITAMINS - use powdered substitutes.    Best cough syrup is delsym  Take up to 2 tsp every 12 hours as needed  Add second nexium 30 min before bfast and preferably the second dose before supper   Please remember to go to the lab department downstairs for your tests - we will call you with the results when they are available.

## 2016-07-03 LAB — RESPIRATORY ALLERGY PROFILE REGION II ~~LOC~~
Allergen, C. Herbarum, M2: <0.1 kU/L
Allergen, Cedar tree, t12: <0.1 kU/L
Allergen, Comm Silver Birch, t9: <0.1 kU/L
Allergen, Mouse Urine Protein, e78: <0.1 kU/L
Allergen, Mulberry, t76: <0.1 kU/L
Allergen, P. notatum, m1: <0.1 kU/L
Aspergillus fumigatus, m3: <0.1 kU/L
Box Elder IgE: <0.1 kU/L
Cockroach: <0.1 kU/L
Common Ragweed: <0.1 kU/L
Elm IgE: <0.1 kU/L
IgE (Immunoglobulin E), Serum: 22 kU/L (ref <115)
Sheep Sorrel IgE: <0.1 kU/L

## 2016-07-04 NOTE — Progress Notes (Signed)
LMTCB

## 2016-07-06 DIAGNOSIS — I10 Essential (primary) hypertension: Secondary | ICD-10-CM | POA: Insufficient documentation

## 2016-07-06 HISTORY — DX: Essential (primary) hypertension: I10

## 2016-07-06 HISTORY — DX: Morbid (severe) obesity due to excess calories: E66.01

## 2016-07-06 NOTE — Assessment & Plan Note (Signed)
post bronchitic or related to reflux -  07/26/2014 p extensive coaching HFA effectiveness =    90%  - Allergy profile 07/02/16  >  Eos 0.1 /  IgE 22  RAST neg    Strongly favor UACS over asthma here  Upper airway cough syndrome (previously labeled PNDS) , is  so named because it's frequently impossible to sort out how much is  CR/sinusitis with freq throat clearing (which can be related to primary GERD)   vs  causing  secondary (" extra esophageal")  GERD from wide swings in gastric pressure that occur with throat clearing, often  promoting self use of mint and menthol lozenges that reduce the lower esophageal sphincter tone and exacerbate the problem further in a cyclical fashion.   These are the same pts (now being labeled as having "irritable larynx syndrome" by some cough centers) who not infrequently have a history of having failed to tolerate ace inhibitors,  dry powder inhalers or biphosphonates or report having atypical/extraesophageal reflux symptoms that don't respond to standard doses of PPI  and are easily confused as having aecopd or asthma flares by even experienced allergists/ pulmonologists (myself included).  Of the three most common causes of chronic cough, only one (GERD)  can actually cause the other two (asthma and post nasal drip syndrome)  and perpetuate the cylce of cough inducing airway trauma, inflammation, heightened sensitivity to reflux which is prompted by the cough itself via a cyclical mechanism.    This may partially respond to steroids and look like asthma and post nasal drainage but never erradicated completely unless the cough and the secondary reflux are eliminated, preferably both at the same time.  While not intuitively obvious, many patients with chronic low grade reflux do not cough until there is a secondary insult that disturbs the protective epithelial barrier and exposes sensitive nerve endings.  This can be viral or direct physical injury such as with an  endotracheal tube.   The point is that once this occurs, it is difficult to eliminate using anything but a maximally effective acid suppression regimen at least in the short run, accompanied by an appropriate diet to address non acid GERD.    For now max rx directed at gerd/ cyclical cough pus rx with zpak/ precd x 6d only  and f/u with Dr Elsworth Soho prn with sinus CT next step if not better.  I had an extended discussion with the patient reviewing all relevant studies completed to date and  lasting 25 minutes of a 40  minute acte  visit  In pt now well known to me with  re  non-specific but potentially very serious acute pulmonary symptoms of unknown etiology.  Each maintenance medication was reviewed in detail including most importantly the difference between maintenance and prns and under what circumstances the prns are to be triggered using an action plan format that is not reflected in the computer generated alphabetically organized AVS.    Please see instructions for details which were reviewed in writing and the patient given a copy highlighting the part that I personally wrote and discussed at today's ov.

## 2016-07-06 NOTE — Assessment & Plan Note (Signed)
Body mass index is 36.3 kg/m.  No results found for: TSH   Contributing to gerd tendency/ doe/reviewed the need and the process to achieve and maintain neg calorie balance > defer f/u primary care including intermittently monitoring thyroid status

## 2016-07-06 NOTE — Assessment & Plan Note (Signed)
Already on hyzaar/ advised to avoid decongestants/ salt and Follow up per Primary Care recommended

## 2016-07-06 NOTE — Progress Notes (Signed)
Spoke with pt and notified of results per Dr. Wert. Pt verbalized understanding and denied any questions. 

## 2016-08-08 DIAGNOSIS — K645 Perianal venous thrombosis: Secondary | ICD-10-CM | POA: Diagnosis not present

## 2016-08-08 MED FILL — PROCTO-MED HC 2.5% CREAM: 2.5 | 10 days supply | Qty: 30 | Fill #0

## 2016-08-08 MED FILL — AMERICAINE 20% HEMORRHOID O: 20 | 7 days supply | Qty: 28 | Fill #0

## 2016-08-15 MED FILL — LOSARTAN-HCTZ 50-12.5 MG TA: 50-12.5 | 90 days supply | Qty: 90 | Fill #0

## 2016-09-13 DIAGNOSIS — E669 Obesity, unspecified: Secondary | ICD-10-CM | POA: Diagnosis not present

## 2016-09-13 DIAGNOSIS — G4733 Obstructive sleep apnea (adult) (pediatric): Secondary | ICD-10-CM | POA: Diagnosis not present

## 2016-09-13 DIAGNOSIS — I10 Essential (primary) hypertension: Secondary | ICD-10-CM | POA: Diagnosis not present

## 2016-09-13 DIAGNOSIS — Z6839 Body mass index (BMI) 39.0-39.9, adult: Secondary | ICD-10-CM | POA: Diagnosis not present

## 2016-09-27 MED FILL — NexIUM 40 MG CPDR: 40 | 4 days supply | Qty: 4 | Fill #4

## 2016-10-03 MED FILL — ESOMEPRAZOLE MAG DR 40 MG C: 40 | 90 days supply | Qty: 90 | Fill #5

## 2016-10-09 MED FILL — GAVILYTE-N SOLUTION: 420 | 2 days supply | Qty: 4000 | Fill #0

## 2016-10-12 DIAGNOSIS — D126 Benign neoplasm of colon, unspecified: Secondary | ICD-10-CM | POA: Diagnosis not present

## 2016-10-12 DIAGNOSIS — K5730 Diverticulosis of large intestine without perforation or abscess without bleeding: Secondary | ICD-10-CM | POA: Diagnosis not present

## 2016-10-12 DIAGNOSIS — Z86010 Personal history of colonic polyps: Secondary | ICD-10-CM | POA: Diagnosis not present

## 2016-10-15 DIAGNOSIS — K219 Gastro-esophageal reflux disease without esophagitis: Secondary | ICD-10-CM | POA: Insufficient documentation

## 2016-10-15 DIAGNOSIS — I10 Essential (primary) hypertension: Secondary | ICD-10-CM | POA: Insufficient documentation

## 2016-10-15 HISTORY — DX: Essential (primary) hypertension: I10

## 2016-10-15 HISTORY — DX: Gastro-esophageal reflux disease without esophagitis: K21.9

## 2016-11-21 MED FILL — LOSARTAN-HCTZ 50-12.5 MG TA: 50-12.5 | 90 days supply | Qty: 90 | Fill #1

## 2016-12-04 DIAGNOSIS — D225 Melanocytic nevi of trunk: Secondary | ICD-10-CM | POA: Diagnosis not present

## 2016-12-04 DIAGNOSIS — D1800 Hemangioma unspecified site: Secondary | ICD-10-CM | POA: Diagnosis not present

## 2016-12-04 DIAGNOSIS — L859 Epidermal thickening, unspecified: Secondary | ICD-10-CM | POA: Diagnosis not present

## 2016-12-04 DIAGNOSIS — L814 Other melanin hyperpigmentation: Secondary | ICD-10-CM | POA: Diagnosis not present

## 2016-12-04 DIAGNOSIS — L821 Other seborrheic keratosis: Secondary | ICD-10-CM | POA: Diagnosis not present

## 2016-12-04 DIAGNOSIS — Z85828 Personal history of other malignant neoplasm of skin: Secondary | ICD-10-CM | POA: Diagnosis not present

## 2016-12-13 DIAGNOSIS — N200 Calculus of kidney: Secondary | ICD-10-CM | POA: Diagnosis not present

## 2016-12-13 MED FILL — KETOROLAC 10 MG TABLET: 10 | 6 days supply | Qty: 20 | Fill #0

## 2016-12-13 MED FILL — OXYCODONE-ACETAMINOPHEN 5-3: 5-325 | 3 days supply | Qty: 20 | Fill #0

## 2016-12-24 IMAGING — CT CT ABD-PELV W/ CM
2 of 5 series · 16 of 46 positions shown, 18 images · IV contrast (Omnipaque 300)
Comparison: CT abdomen 04/12/2015 ; 04/28/2014.

CLINICAL DATA: Patient with left lower abdominal pain, worsening
throughout the day.

EXAM:
CT ABDOMEN AND PELVIS WITH CONTRAST
TECHNIQUE: Multidetector CT imaging of the abdomen and pelvis was performed
using the standard protocol following bolus administration of
intravenous contrast.
CONTRAST:  100mL OMNIPAQUE IOHEXOL 300 MG/ML  SOLN

[Series 2: abd_pel_with 5.0 b40f · axial · 0.88mm/px · z∈[-450,+0]mm · 13 of 104 slices shown, 15 images]
[im 7/104  soft-tissue]
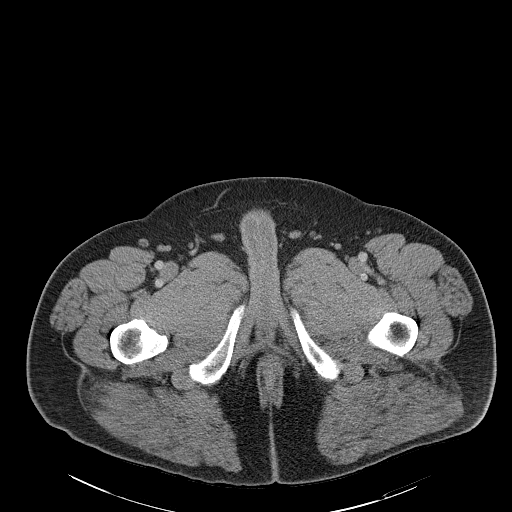
[im 7/104  bone]
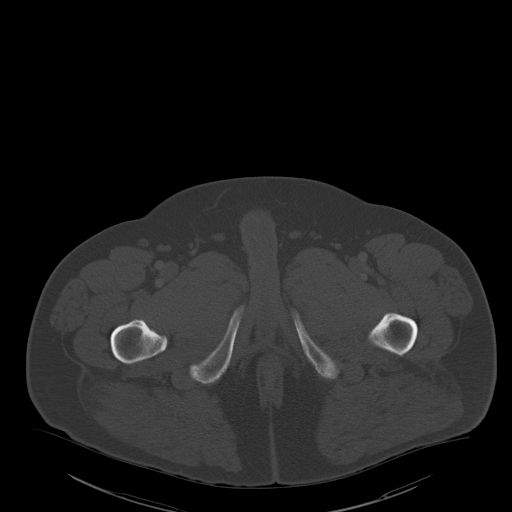
[im 13/104  soft-tissue]
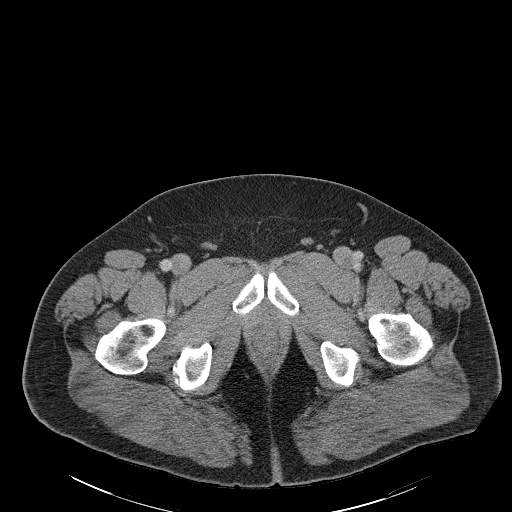
[im 25/104  soft-tissue]
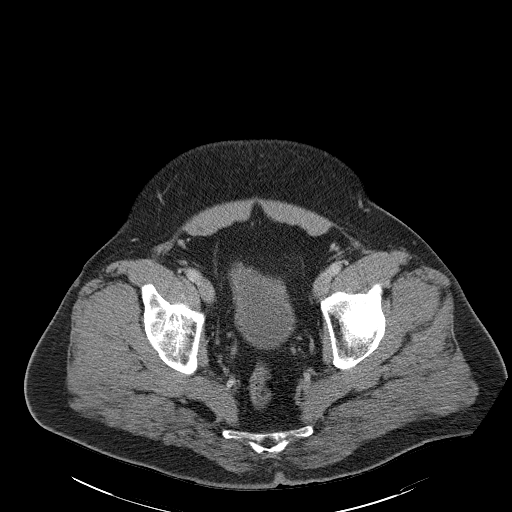
[im 31/104  soft-tissue]
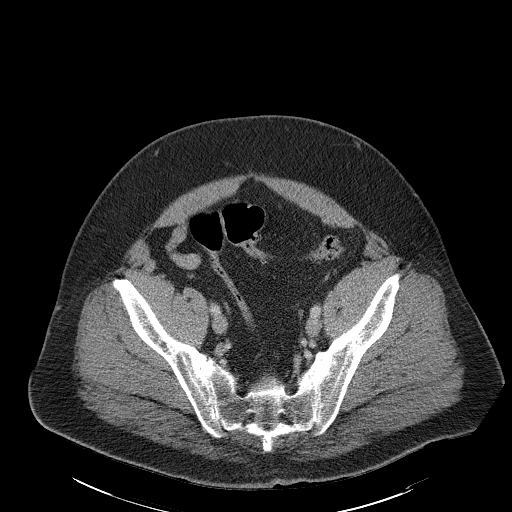
[im 37/104  soft-tissue]
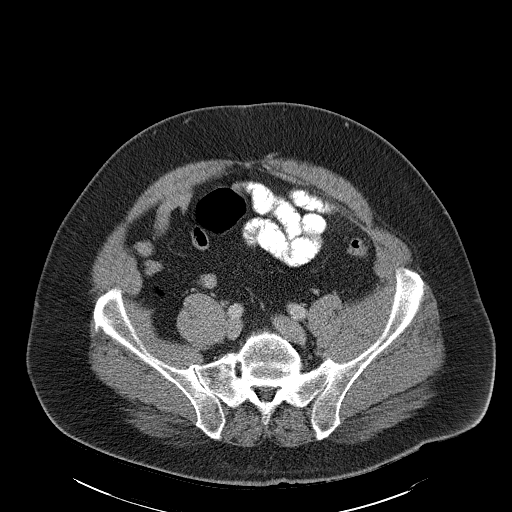
[im 43/104  soft-tissue]
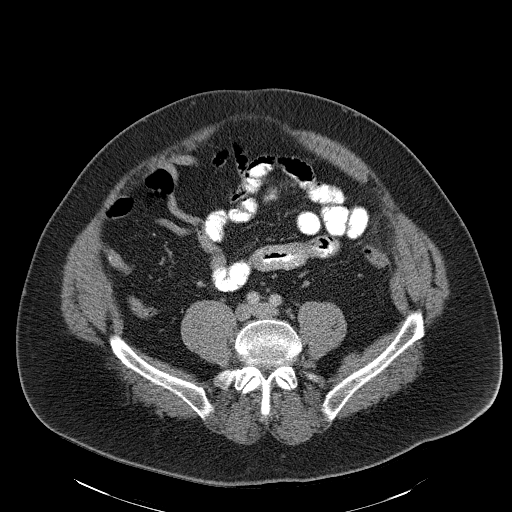
[im 55/104  soft-tissue]
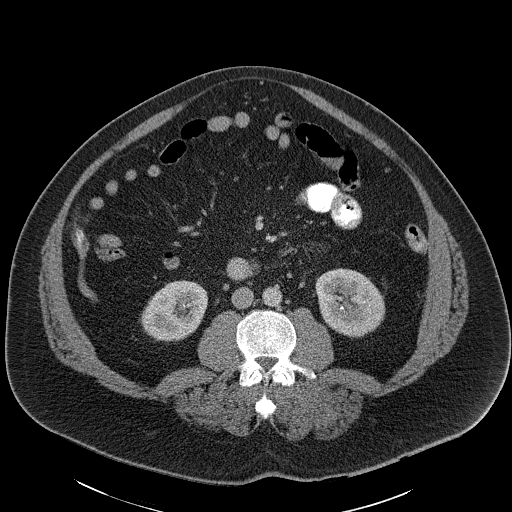
[im 61/104  soft-tissue]
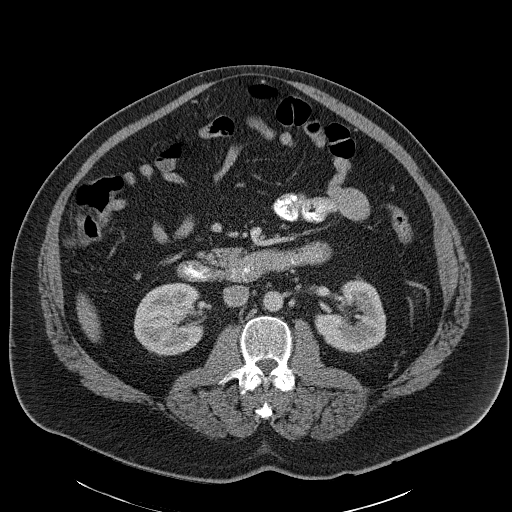
[im 67/104  soft-tissue]
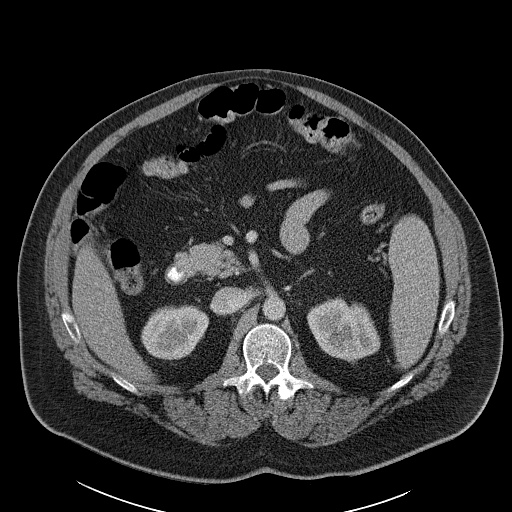
[im 67/104  bone]
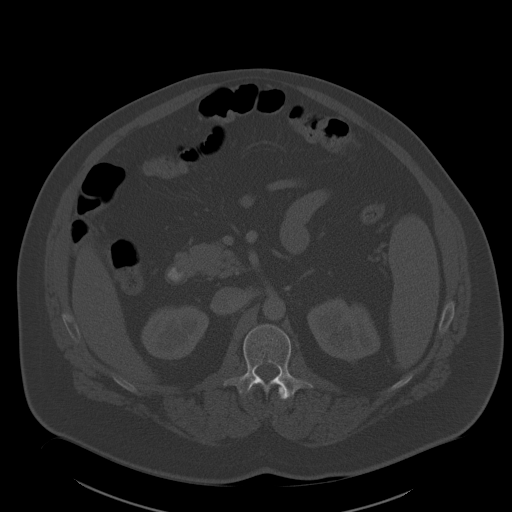
[im 73/104  soft-tissue]
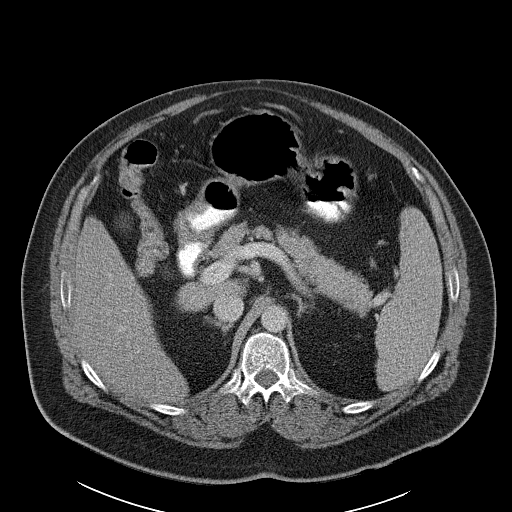
[im 79/104  soft-tissue]
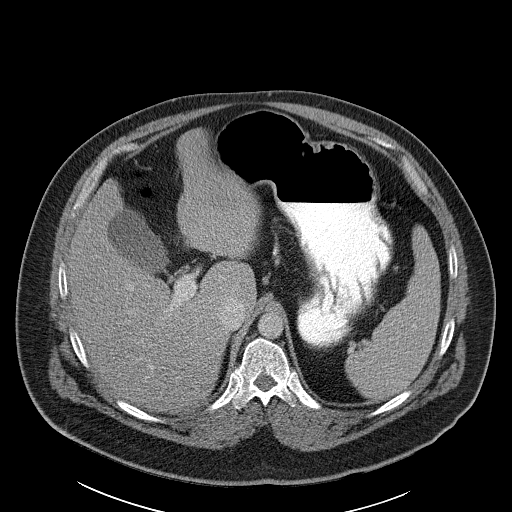
[im 91/104  soft-tissue]
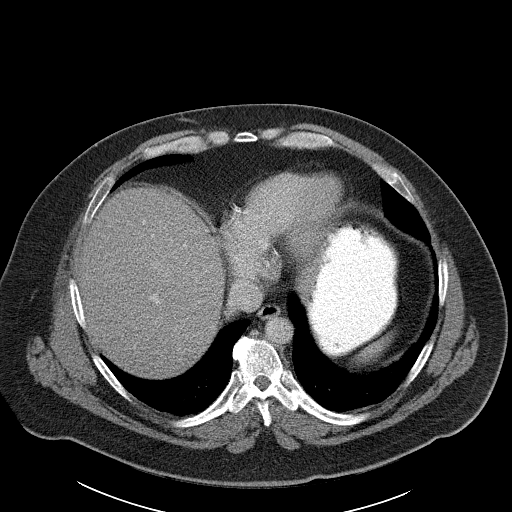
[im 97/104  soft-tissue]
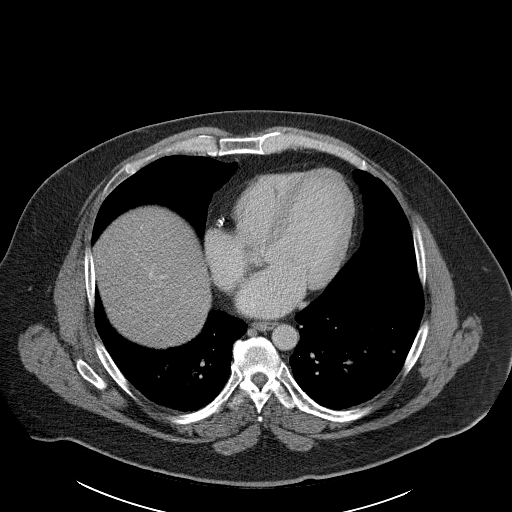

[Series 3: abd_pel_with 3.0 spo cor · coronal · 0.98mm/px · 3 of 121 slices shown]
[im 41/121  soft-tissue]
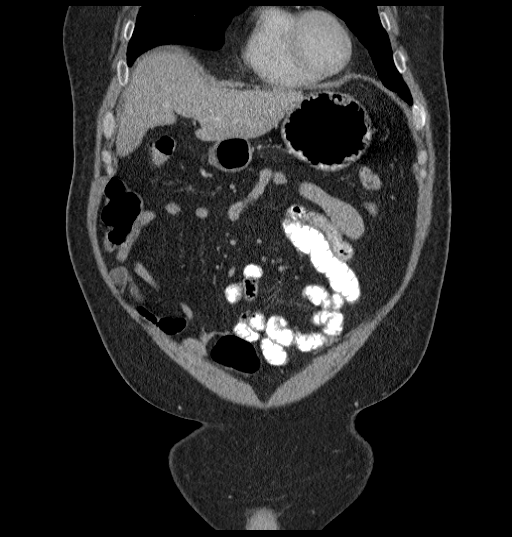
[im 54/121  soft-tissue]
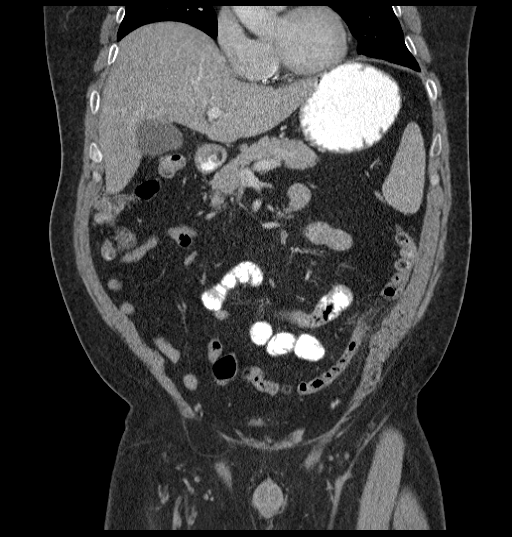
[im 67/121  soft-tissue]
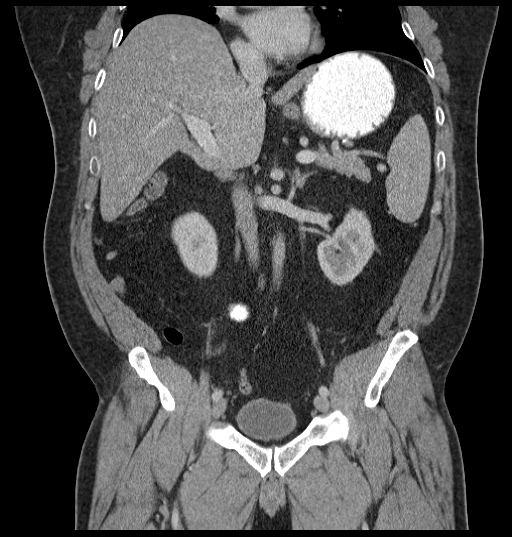

[16 of 46 positions shown; findings below may reference images not displayed]

FINDINGS: Lower chest: Normal heart size. Dependent atelectasis within the
bilateral lower lobes. Unchanged 6 mm nodule within the left lower
lobe (image 9; series 6).

Hepatobiliary: Liver is normal in size and contour. No focal hepatic
lesions identified. Gallbladder is unremarkable. No intrahepatic or
extrahepatic biliary ductal dilatation.

Pancreas: Unremarkable

Spleen: Unremarkable

Adrenals/Urinary Tract: Normal adrenal glands. Kidneys enhance
symmetrically with contrast. No hydronephrosis. 3 mm nonobstructing
stone inferior pole left kidney and 3 mm nonobstructing stone
inferior pole right kidney. Urinary bladder is unremarkable. No
ureterolithiasis.

Stomach/Bowel: Sigmoid colonic diverticulosis. There is a small
amount of fat stranding about the descending colon (image 62; series
2), suggestive of mild acute diverticulitis. The appendix is normal.
Normal morphology of the stomach. No evidence for bowel obstruction.
No free fluid or free intraperitoneal air.

Vascular/Lymphatic: Normal caliber abdominal aorta. No
retroperitoneal lymphadenopathy.

Other: Small fat containing right inguinal hernia. Prostate
unremarkable.

Musculoskeletal: Lumbar spine degenerative changes. No aggressive or
acute appearing osseous lesions.
IMPRESSION: Findings most compatible with mild diverticulitis.

Nonobstructing 3 mm stones inferior pole of the right and left
kidneys.

Unchanged 6 mm left lower lobe pulmonary nodule. Recommend 1
additional follow-up chest CT in 12 months to document 2 years of
stability.

## 2016-12-27 DIAGNOSIS — N200 Calculus of kidney: Secondary | ICD-10-CM | POA: Diagnosis not present

## 2017-01-03 MED FILL — ESOMEPRAZOLE MAG DR 40 MG C: 40 | 90 days supply | Qty: 90 | Fill #0

## 2017-02-21 MED FILL — LOSARTAN POTASSIUM-HCTZ 50-: 50-12.5 | 90 days supply | Qty: 90 | Fill #2

## 2017-04-04 MED FILL — ESOMEPRAZOLE MAG DR 40 MG C: 40 | 90 days supply | Qty: 90 | Fill #1

## 2017-04-08 DIAGNOSIS — K219 Gastro-esophageal reflux disease without esophagitis: Secondary | ICD-10-CM | POA: Diagnosis not present

## 2017-04-08 DIAGNOSIS — I10 Essential (primary) hypertension: Secondary | ICD-10-CM | POA: Diagnosis not present

## 2017-05-27 MED FILL — LOSARTAN-HCTZ 50-12.5 MG TA: 50-12.5 | 90 days supply | Qty: 90 | Fill #3

## 2017-06-05 DIAGNOSIS — S8982XA Other specified injuries of left lower leg, initial encounter: Secondary | ICD-10-CM | POA: Diagnosis not present

## 2017-06-05 DIAGNOSIS — M1712 Unilateral primary osteoarthritis, left knee: Secondary | ICD-10-CM | POA: Diagnosis not present

## 2017-06-05 DIAGNOSIS — M25562 Pain in left knee: Secondary | ICD-10-CM

## 2017-06-05 HISTORY — DX: Pain in left knee: M25.562

## 2017-06-07 ENCOUNTER — Other Ambulatory Visit: Payer: Self-pay | Admitting: Orthopedic Surgery

## 2017-06-07 DIAGNOSIS — M25562 Pain in left knee: Secondary | ICD-10-CM

## 2017-06-14 ENCOUNTER — Ambulatory Visit
Admission: RE | Admit: 2017-06-14 | Discharge: 2017-06-14 | Disposition: A | Discharge status: Home / Self Care | Payer: 59 | Source: Ambulatory Visit | Attending: Orthopedic Surgery | Admitting: Orthopedic Surgery

## 2017-06-14 ENCOUNTER — Other Ambulatory Visit: Payer: Self-pay | Admitting: Orthopedic Surgery

## 2017-06-14 DIAGNOSIS — Z77018 Contact with and (suspected) exposure to other hazardous metals: Secondary | ICD-10-CM

## 2017-06-14 DIAGNOSIS — Z01818 Encounter for other preprocedural examination: Secondary | ICD-10-CM | POA: Diagnosis not present

## 2017-06-14 DIAGNOSIS — R600 Localized edema: Secondary | ICD-10-CM | POA: Diagnosis not present

## 2017-06-14 DIAGNOSIS — M25562 Pain in left knee: Secondary | ICD-10-CM

## 2017-06-14 DIAGNOSIS — Z135 Encounter for screening for eye and ear disorders: Secondary | ICD-10-CM | POA: Diagnosis not present

## 2017-06-18 DIAGNOSIS — S8002XA Contusion of left knee, initial encounter: Secondary | ICD-10-CM | POA: Diagnosis not present

## 2017-07-03 MED FILL — ESOMEPRAZOLE MAG DR 40 MG C: 40 | 90 days supply | Qty: 90 | Fill #2

## 2017-08-26 MED FILL — LOSARTAN-HCTZ 50-12.5 MG TA: 50-12.5 | 90 days supply | Qty: 90 | Fill #0

## 2017-09-09 ENCOUNTER — Encounter: Payer: Self-pay | Admitting: Pulmonary Disease

## 2017-09-09 ENCOUNTER — Ambulatory Visit: Payer: 59 | Admitting: Pulmonary Disease

## 2017-09-09 DIAGNOSIS — J45991 Cough variant asthma: Secondary | ICD-10-CM

## 2017-09-09 DIAGNOSIS — J329 Chronic sinusitis, unspecified: Secondary | ICD-10-CM | POA: Diagnosis not present

## 2017-09-09 DIAGNOSIS — J40 Bronchitis, not specified as acute or chronic: Secondary | ICD-10-CM

## 2017-09-09 MED ORDER — AZITHROMYCIN 250 MG PO TABS: ORAL_TABLET | ORAL | 0 refills | Status: DC

## 2017-09-09 MED ORDER — PREDNISONE 10 MG PO TABS: ORAL_TABLET | ORAL | 0 refills | Status: DC

## 2017-09-09 MED FILL — predniSONE 10 MG TABS: 10 | 10 days supply | Qty: 15 | Fill #0

## 2017-09-09 MED FILL — AZITHROMYCIN 250 MG TAB: 250 | 5 days supply | Qty: 6 | Fill #0

## 2017-09-09 NOTE — Progress Notes (Signed)
   Subjective:    Patient ID: [name], male    DOB: 03-14-1966, 52 y.o.   MRN: [id number]  HPI  52 yo former smoker with  upper airway cough syndrome -Attacks of sinobronchitis at least once a year He also has a history of OSA and did not tolerate CPAP therapy in the past He has GERD and stays on Nexium  Last such episode was in 2017, he has done well since then. No reports of a head cold, postnasal drainage, years feeling clogged and cough productive of yellow-green sputum, yellow nasal drainage Similar to his previous episodes. No fevers and no sick contacts  Significant tests/ events  Spirometry 10/2013 showed mild restriction with normal ratio, FEV1 73% -2.87, FVC 3.59 -71%    Review of Systems Patient denies significant dyspnea,cough, hemoptysis,  chest pain, palpitations, pedal edema, orthopnea, paroxysmal nocturnal dyspnea, lightheadedness, nausea, vomiting, abdominal or  leg pains      Objective:   Physical Exam  Gen. Pleasant, obese, in no distress ENT -  Red pharynx, no post nasal drip Neck: No JVD, no thyromegaly, no carotid bruits Lungs: no use of accessory muscles, no dullness to percussion, decreased without rales or rhonchi  Cardiovascular: Rhythm regular, heart sounds  normal, no murmurs or gallops, no peripheral edema Musculoskeletal: No deformities, no cyanosis or clubbing , no tremors        Assessment & Plan:

## 2017-09-09 NOTE — Assessment & Plan Note (Signed)
-  appears to be another attack of sinobronchitis  Zpak Prednisone 10 mg tabs  Take 2 tabs daily with food x 5ds, then 1 tab daily with food x 5ds then STOP  Delsym 5 mL twice daily as needed for cough. Okay to take store brand Sudafed for nasal drainage for 5 days

## 2017-09-09 NOTE — Assessment & Plan Note (Signed)
Doubt true asthma, shortness of prednisone for bronchospasm related to acute bronchitis

## 2017-09-09 NOTE — Addendum Note (Signed)
Addended by: Elton Sin on: 09/09/2017 04:39 PM   Modules accepted: Orders

## 2017-09-09 NOTE — Patient Instructions (Signed)
Zpak Prednisone 10 mg tabs  Take 2 tabs daily with food x 5ds, then 1 tab daily with food x 5ds then STOP  Delsym 5 mL twice daily as needed for cough. Okay to take store brand Sudafed for nasal drainage for 5 days

## 2017-09-19 DIAGNOSIS — Z125 Encounter for screening for malignant neoplasm of prostate: Secondary | ICD-10-CM | POA: Diagnosis not present

## 2017-09-19 DIAGNOSIS — K219 Gastro-esophageal reflux disease without esophagitis: Secondary | ICD-10-CM | POA: Diagnosis not present

## 2017-09-19 DIAGNOSIS — G4733 Obstructive sleep apnea (adult) (pediatric): Secondary | ICD-10-CM | POA: Diagnosis not present

## 2017-09-19 DIAGNOSIS — E782 Mixed hyperlipidemia: Secondary | ICD-10-CM | POA: Diagnosis not present

## 2017-09-19 DIAGNOSIS — Z0000 Encounter for general adult medical examination without abnormal findings: Secondary | ICD-10-CM | POA: Diagnosis not present

## 2017-09-19 DIAGNOSIS — I10 Essential (primary) hypertension: Secondary | ICD-10-CM | POA: Diagnosis not present

## 2017-09-23 ENCOUNTER — Other Ambulatory Visit: Payer: Self-pay | Admitting: Internal Medicine

## 2017-09-23 DIAGNOSIS — R9431 Abnormal electrocardiogram [ECG] [EKG]: Secondary | ICD-10-CM

## 2017-09-27 ENCOUNTER — Other Ambulatory Visit: Payer: Self-pay

## 2017-09-27 ENCOUNTER — Ambulatory Visit (HOSPITAL_COMMUNITY): Payer: 59 | Attending: Cardiology

## 2017-09-27 DIAGNOSIS — J45909 Unspecified asthma, uncomplicated: Secondary | ICD-10-CM | POA: Diagnosis not present

## 2017-09-27 DIAGNOSIS — I119 Hypertensive heart disease without heart failure: Secondary | ICD-10-CM | POA: Insufficient documentation

## 2017-09-27 DIAGNOSIS — R9431 Abnormal electrocardiogram [ECG] [EKG]: Secondary | ICD-10-CM | POA: Diagnosis not present

## 2017-09-27 DIAGNOSIS — E669 Obesity, unspecified: Secondary | ICD-10-CM | POA: Diagnosis not present

## 2017-09-27 DIAGNOSIS — Z8249 Family history of ischemic heart disease and other diseases of the circulatory system: Secondary | ICD-10-CM | POA: Insufficient documentation

## 2017-09-27 DIAGNOSIS — G4733 Obstructive sleep apnea (adult) (pediatric): Secondary | ICD-10-CM | POA: Insufficient documentation

## 2017-10-07 MED FILL — ESOMEPRAZOLE MAG DR 40 MG C: 40 | 90 days supply | Qty: 90 | Fill #3

## 2017-10-27 DIAGNOSIS — J019 Acute sinusitis, unspecified: Secondary | ICD-10-CM | POA: Diagnosis not present

## 2017-11-11 DIAGNOSIS — H401122 Primary open-angle glaucoma, left eye, moderate stage: Secondary | ICD-10-CM | POA: Diagnosis not present

## 2017-11-11 DIAGNOSIS — H524 Presbyopia: Secondary | ICD-10-CM | POA: Diagnosis not present

## 2017-11-12 MED FILL — LATANOPROST 0.005% EYE DRP: 0.005 | 50 days supply | Qty: 3 | Fill #0

## 2017-11-26 MED FILL — LOSARTAN-HCTZ 50-12.5 MG TA: 50-12.5 | 90 days supply | Qty: 90 | Fill #1

## 2017-12-16 DIAGNOSIS — H401122 Primary open-angle glaucoma, left eye, moderate stage: Secondary | ICD-10-CM | POA: Diagnosis not present

## 2017-12-18 DIAGNOSIS — L570 Actinic keratosis: Secondary | ICD-10-CM | POA: Diagnosis not present

## 2017-12-18 DIAGNOSIS — Z85828 Personal history of other malignant neoplasm of skin: Secondary | ICD-10-CM | POA: Diagnosis not present

## 2017-12-18 DIAGNOSIS — D1800 Hemangioma unspecified site: Secondary | ICD-10-CM | POA: Diagnosis not present

## 2017-12-18 DIAGNOSIS — L820 Inflamed seborrheic keratosis: Secondary | ICD-10-CM | POA: Diagnosis not present

## 2017-12-18 DIAGNOSIS — L918 Other hypertrophic disorders of the skin: Secondary | ICD-10-CM | POA: Diagnosis not present

## 2017-12-18 DIAGNOSIS — L814 Other melanin hyperpigmentation: Secondary | ICD-10-CM | POA: Diagnosis not present

## 2017-12-18 DIAGNOSIS — D225 Melanocytic nevi of trunk: Secondary | ICD-10-CM | POA: Diagnosis not present

## 2017-12-18 DIAGNOSIS — L821 Other seborrheic keratosis: Secondary | ICD-10-CM | POA: Diagnosis not present

## 2018-01-02 MED FILL — ESOMEPRAZOLE MAG DR 40 MG C: 40 | 90 days supply | Qty: 90 | Fill #0

## 2018-01-08 MED FILL — LATANOPROST 0.005% EYE DRP: 0.005 | 50 days supply | Qty: 3 | Fill #1

## 2018-02-26 MED FILL — LOSARTAN-HCTZ 50-12.5 MG TA: 50-12.5 | 90 days supply | Qty: 90 | Fill #2

## 2018-03-12 MED FILL — LATANOPROST 0.005% EYE DRP: 0.005 | 50 days supply | Qty: 3 | Fill #2

## 2018-03-31 DIAGNOSIS — G4733 Obstructive sleep apnea (adult) (pediatric): Secondary | ICD-10-CM | POA: Diagnosis not present

## 2018-03-31 DIAGNOSIS — K219 Gastro-esophageal reflux disease without esophagitis: Secondary | ICD-10-CM | POA: Diagnosis not present

## 2018-03-31 DIAGNOSIS — I10 Essential (primary) hypertension: Secondary | ICD-10-CM | POA: Diagnosis not present

## 2018-04-07 MED FILL — ESOMEPRAZOLE MAG DR 40 MG C: 40 | 90 days supply | Qty: 90 | Fill #1

## 2018-04-30 MED FILL — LATANOPROST 0.005% EYE DRP: 0.005 | 50 days supply | Qty: 3 | Fill #3

## 2018-05-02 ENCOUNTER — Ambulatory Visit: Payer: 59 | Admitting: Primary Care

## 2018-05-02 ENCOUNTER — Encounter: Payer: Self-pay | Admitting: Primary Care

## 2018-05-02 ENCOUNTER — Telehealth: Payer: Self-pay | Admitting: Pulmonary Disease

## 2018-05-02 DIAGNOSIS — J40 Bronchitis, not specified as acute or chronic: Secondary | ICD-10-CM

## 2018-05-02 DIAGNOSIS — J329 Chronic sinusitis, unspecified: Secondary | ICD-10-CM

## 2018-05-02 MED ORDER — ALBUTEROL SULFATE HFA 108 (90 BASE) MCG/ACT IN AERS: 2.0000 | INHALATION_SPRAY | Freq: Four times a day (QID) | RESPIRATORY_TRACT | 5 refills | Status: DC | PRN

## 2018-05-02 MED ORDER — PREDNISONE 10 MG PO TABS: ORAL_TABLET | ORAL | 0 refills | Status: DC

## 2018-05-02 MED ORDER — ALBUTEROL SULFATE 108 (90 BASE) MCG/ACT IN AEPB
2.0000 | INHALATION_SPRAY | Freq: Four times a day (QID) | RESPIRATORY_TRACT | 2 refills | Status: DC | PRN
Start: 2018-05-02 — End: 2018-05-02

## 2018-05-02 MED FILL — VENTOLIN HFA 90 MCG INHALER: 108 (90 BAS | 25 days supply | Qty: 18 | Fill #0

## 2018-05-02 MED FILL — predniSONE 10 MG TABS: 10 | 5 days supply | Qty: 10 | Fill #0

## 2018-05-02 NOTE — Assessment & Plan Note (Addendum)
-   Triggered by recent bush hogging. 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

## 2018-05-02 NOTE — Telephone Encounter (Signed)
Spoke with pharmacy-they wanted to make sure its okay to change Rx of Proair respiclick to [employer] standard inhaler. Gave approval and nothing more needed at this time.-Updated in Epic.

## 2018-05-02 NOTE — Progress Notes (Signed)
@Patient  ID: [name], male    DOB: 1966/08/02, 52 y.o.   MRN: [id number]  Chief Complaint  Patient presents with  . Acute Visit    congestion, cough with yellow mucus, headache and sinus pressure x4 days    Referring provider: Lavone Orn, MD  HPI: 52 year old male, former smoker quit 1985. PMH cough variant asthma vs recurrent upper airway cough syndrome, sinobronchitis, GERD. Patient of Dr. Elsworth Soho, last seen on 09/09/17.   05/02/2018 Patient presents today with acute complaints of cough, sinus pressure and HA x 4 days. Cough is productive with yellow mucus for the last two days. Some associated shortness of breath. States that he was bush hogging this weekend and not wearing a mask. He has been taking Claritin and Nasacort daily. Has rescue inhaler but it's out of date. Takes nexium for GERD symptoms. Denies fever, chills, s/t.    Allergies  Allergen Reactions  . Neosporin [Neomycin-Bacitracin Zn-Polymyx]     unknown  . Penicillins     unknown  . Tramadol Hcl Hives and Itching    Immunization History  Administered Date(s) Administered  . Influenza Split 05/13/2013, 05/13/2017  . Influenza,inj,Quad PF,6+ Mos 05/19/2015, 05/13/2016  . Influenza-Unspecified 06/01/2014    Past Medical History:  Diagnosis Date  . Asthma    Dr. Laurann Montana  . Hypertension   . Kidney stones   . OSA (obstructive sleep apnea)   . Renal disorder     Tobacco History: Social History   Tobacco Use  Smoking Status Former Smoker  . Packs/day: 1.00  . Years: 1.00  . Pack years: 1.00  . Types: Cigarettes  . Last attempt to quit: 08/14/1983  . Years since quitting: 34.7  Smokeless Tobacco Current User  . Types: Chew  Tobacco Comment   dip   Ready to quit: Not Answered Counseling given: Not Answered Comment: dip   Outpatient Medications Prior to Visit  Medication Sig Dispense Refill  . esomeprazole (NEXIUM) 40 MG capsule Take 40 mg by mouth every evening.     Marland Kitchen  losartan-hydrochlorothiazide (HYZAAR) 50-12.5 MG per tablet Take 1 tablet by mouth daily.    . mometasone (NASONEX) 50 MCG/ACT nasal spray Place 2 sprays into the nose daily.    . Multiple Vitamin (MULTIVITAMIN) tablet Take 1 tablet by mouth daily.    Marland Kitchen albuterol (PROVENTIL HFA;VENTOLIN HFA) 108 (90 BASE) MCG/ACT inhaler Inhale 2 puffs into the lungs every 6 (six) hours as needed for wheezing or shortness of breath.    Marland Kitchen azithromycin (ZITHROMAX) 250 MG tablet Take as directed 6 tablet 0  . predniSONE (DELTASONE) 10 MG tablet 2 tab x 5 day,1 tab x 5 day and stop 15 tablet 0   No facility-administered medications prior to visit.     Review of Systems  Review of Systems  Constitutional: Negative.   HENT: Positive for congestion, postnasal drip, rhinorrhea and sinus pressure. Negative for sinus pain, sneezing, sore throat and trouble swallowing.   Respiratory: Positive for cough and shortness of breath. Negative for apnea, choking, chest tightness and stridor.   Cardiovascular: Negative.     Physical Exam  BP 132/88 (BP Location: Left Arm, Cuff Size: Normal)   Pulse 72   Ht 5\' 9"  (1.753 m)   Wt 258 lb 3.2 oz (117.1 kg)   SpO2 100%   BMI 38.13 kg/m  Physical Exam  Constitutional: He is oriented to person, place, and time. He appears well-developed and well-nourished. No distress.  HENT:  Head:  Normocephalic and atraumatic.  Right Ear: Hearing, tympanic membrane and ear canal normal.  Left Ear: Hearing, tympanic membrane and ear canal normal.  Nose: Rhinorrhea present. Right sinus exhibits no maxillary sinus tenderness and no frontal sinus tenderness. Left sinus exhibits no maxillary sinus tenderness and no frontal sinus tenderness.  Mouth/Throat: Uvula is midline, oropharynx is clear and moist and mucous membranes are normal.  Eyes: Pupils are equal, round, and reactive to light. EOM are normal.  Neck: Normal range of motion. Neck supple.  Cardiovascular: Normal rate and regular  rhythm.  Pulmonary/Chest: Effort normal. No stridor. No respiratory distress. He has wheezes. He has no rales. He exhibits no tenderness.  Musculoskeletal: Normal range of motion.  Neurological: He is alert and oriented to person, place, and time.  Skin: Skin is warm and dry.  Psychiatric: He has a normal mood and affect. His behavior is normal. Judgment and thought content normal.     Lab Results:  CBC    Component Value Date/Time   WBC 11.4 (H) 07/02/2016 1710   RBC 5.22 07/02/2016 1710   HGB 15.8 07/02/2016 1710   HCT 46.0 07/02/2016 1710   PLT 247.0 07/02/2016 1710   MCV 88.1 07/02/2016 1710   MCH 30.8 10/17/2015 1702   MCHC 34.4 07/02/2016 1710   RDW 12.6 07/02/2016 1710   LYMPHSABS 3.1 07/02/2016 1710   MONOABS 0.6 07/02/2016 1710   EOSABS 0.1 07/02/2016 1710   BASOSABS 0.0 07/02/2016 1710    BMET    Component Value Date/Time   NA 139 10/17/2015 1702   K 3.6 10/17/2015 1702   CL 103 10/17/2015 1702   CO2 27 10/17/2015 1702   GLUCOSE 93 10/17/2015 1702   BUN 10 10/17/2015 1702   CREATININE 0.85 10/17/2015 1702   CALCIUM 9.3 10/17/2015 1702   GFRNONAA >60 10/17/2015 1702   GFRAA >60 10/17/2015 1702    BNP No results found for: BNP  ProBNP No results found for: PROBNP  Imaging: No results found.   Assessment & Plan:   Sinobronchitis - Triggered by recent bush hogging. 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

## 2018-05-02 NOTE — Patient Instructions (Signed)
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

## 2018-05-08 DIAGNOSIS — Z23 Encounter for immunization: Secondary | ICD-10-CM | POA: Diagnosis not present

## 2018-05-30 MED FILL — LOSARTAN-HCTZ 50-12.5 MG TA: 50-12.5 | 30 days supply | Qty: 30 | Fill #3

## 2018-06-16 DIAGNOSIS — H401122 Primary open-angle glaucoma, left eye, moderate stage: Secondary | ICD-10-CM | POA: Diagnosis not present

## 2018-06-27 MED FILL — LATANOPROST 0.005% EYE DRP: 0.005 | 50 days supply | Qty: 3 | Fill #4

## 2018-07-01 MED FILL — LOSARTAN-HCTZ 50-12.5 MG TA: 50-12.5 | 30 days supply | Qty: 30 | Fill #4

## 2018-07-07 MED FILL — ESOMEPRAZOLE MAG DR 40 MG C: 40 | 90 days supply | Qty: 90 | Fill #2

## 2018-07-31 MED FILL — LOSARTAN-HCTZ 100-25 MG TAB: 100-25 | 30 days supply | Qty: 15 | Fill #0

## 2018-08-14 NOTE — Progress Notes (Signed)
@Allen [name]  ID: Allen [name], [name]    DOB: Jan 06, 1966, 53 y.o.   MRN: [id number]  Chief Complaint  Allen [name] presents with  . Acute Visit    Cough for 5 days    Referring provider: Lavone Orn, MD  HPI:  53 year old [name] former smoker, current smokeless tobacco user followed in our office for upper airway cough syndrome  PMH: GERD Smoker/ Smoking History: Former smoker.  Quit 1985. Current smokeless tobacco.  Maintenance: None Pt of: Dr. Elsworth Soho  Recent [facility] Encounters:   Last seen in office in September/2019 for allergic sinobronchitis episode.  Allen [name] was treated with prednisone.  08/15/2018  - Visit   53 year old [name] smokeless tobacco user presenting today for acute visit.  Allen [name] with 5 days of symptoms of sore throat, chest congestion, productive cough with green mucus, wheezing with exertion and at night, and occasional chills for the last 5 days.  Allen [name] is afebrile today.  Allen [name] has not used his rescue inhaler.  Allen [name] has tried over-the-counter Mucinex DM for the last 5 days.  Allen [name] had a decrease down to 1 tablet a day due to gastrointestinal side effects when taking 2 tablets.  Allen [name] continues to use smokeless tobacco 1 can last Allen [name] to and after 3 days.  Allen [name] is not interested in stopping at this time.   Tests:  07/02/2016-CBC with differential- eosinophils relative 1.3, eosinophils absolute 0.1  07/02/2016-respiratory allergy panel- negative for allergy elevations, IgE 22  09/27/2017-echocardiogram-LV ejection fraction 55 to 60%, mild LVH, grade 1 diastolic dysfunction  FENO:  No results found for: NITRICOXIDE  PFT: No flowsheet data found.  Imaging: No results found.    Specialty Problems      Pulmonary Problems   Cough variant asthma vs recurrent upper airway cough syndrome    post bronchitic or related to reflux -  07/26/2014 p extensive coaching HFA effectiveness =    90%  - Allergy profile 07/02/16  >  Eos 0.1 /  IgE 22   RAST neg        Sinobronchitis      Allergies  Allergen Reactions  . Neosporin [Neomycin-Bacitracin Zn-Polymyx]     unknown  . Penicillins     unknown  . Tramadol Hcl Hives and Itching    Immunization History  Administered Date(s) Administered  . Influenza Split 05/13/2013, 05/13/2017, 05/15/2018  . Influenza,inj,Quad PF,6+ Mos 05/19/2015, 05/13/2016  . Influenza-Unspecified 06/01/2014    Past Medical History:  Diagnosis Date  . Asthma    Dr. Laurann [place]  . Hypertension   . Kidney stones   . OSA (obstructive sleep apnea)   . Renal disorder     Tobacco History: Social History   Tobacco Use  Smoking Status Former Smoker  . Packs/day: 1.00  . Years: 1.00  . Pack years: 1.00  . Types: Cigarettes  . Last attempt to quit: 08/14/1983  . Years since quitting: 35.0  Smokeless Tobacco Current User  . Types: Chew  Tobacco Comment   dip   Ready to quit: No Counseling given: Yes Comment: dip  We recommend that you stop using smokeless tobacco.  Congratulations on stopping smoking.  Allen [name] is not interested in stopping using smokeless tobacco at this time.  Outpatient Encounter Medications as of 08/15/2018  Medication Sig  . albuterol (PROAIR HFA) 108 (90 Base) MCG/ACT inhaler Inhale 2 puffs into the lungs every 6 (six) hours as needed for wheezing or shortness of breath.  . esomeprazole (NEXIUM) 40 MG capsule Take 40 mg by  mouth every evening.   Marland Kitchen guaiFENesin (MUCINEX) 600 MG 12 hr tablet Take by mouth 2 (two) times daily.  Marland Kitchen losartan-hydrochlorothiazide (HYZAAR) 50-12.5 MG per tablet Take 1 tablet by mouth daily.  . mometasone (NASONEX) 50 MCG/ACT nasal spray Place 2 sprays into the nose daily.  . Multiple Vitamin (MULTIVITAMIN) tablet Take 1 tablet by mouth daily.  Marland Kitchen azithromycin (ZITHROMAX) 250 MG tablet 500mg  (two tablets) today, then 250mg  (1 tablet) for the next 4 days  . predniSONE (DELTASONE) 10 MG tablet Take 2 tablets (20mg  total) daily for the next 5 days. Take  in the AM with food.  . [DISCONTINUED] predniSONE (DELTASONE) 10 MG tablet 2 tabs po qd x 5 days   No facility-administered encounter medications on file as of 08/15/2018.      Review of Systems  Review of Systems  Constitutional: Positive for chills. Negative for activity change, fatigue, fever and unexpected weight change.  HENT: Positive for congestion, hearing loss (muffled hearing ), postnasal drip, sinus pressure and sore throat. Negative for rhinorrhea and sinus pain.   Eyes: Negative.   Respiratory: Positive for cough (productive cough with green mucous ) and wheezing. Negative for chest tightness and shortness of breath.   Cardiovascular: Negative for chest pain and palpitations.  Gastrointestinal: Positive for diarrhea (mucinex upset stomach, - down to one a day ). Negative for nausea and vomiting.       Denies acid reflux-like symptoms.  Allen [name] reports he may have one episode a month.  When he knows he eats something that triggers his acid reflux.  Allen [name] remains adherent to Nexium in the evening which is managed by his GI doctor.  GI aware the Allen [name] takes Nexium in the evening.  Endocrine: Negative.   Musculoskeletal: Negative.   Skin: Negative.   Neurological: Negative for dizziness and headaches.  Psychiatric/Behavioral: Negative.  Negative for dysphoric mood. The Allen [name] is not nervous/anxious.   All other systems reviewed and are negative.    Physical Exam  BP 134/78 (BP Location: Left Arm, Cuff Size: Normal)   Pulse 71   Temp 98.4 F (Allen.9 C) (Oral)   Ht 5\' 9"  (1.753 m)   Wt 260 lb 6.4 oz (118.1 kg)   SpO2 93%   BMI 38.45 kg/m   Wt Readings from Last 5 Encounters:  08/15/18 260 lb 6.4 oz (118.1 kg)  05/02/18 258 lb 3.2 oz (117.1 kg)  09/09/17 269 lb (122 kg)  07/02/16 253 lb (114.8 kg)  04/09/16 253 lb 12.8 oz (115.1 kg)    Physical Exam  Constitutional: He is oriented to person, place, and time and well-developed, well-nourished, and in no distress.  No distress.  HENT:  Head: Normocephalic and atraumatic.  Right Ear: Hearing, tympanic membrane, external ear and ear canal normal.  Left Ear: Hearing, tympanic membrane, external ear and ear canal normal.  Nose: Mucosal edema present. Right sinus exhibits no maxillary sinus tenderness and no frontal sinus tenderness. Left sinus exhibits no maxillary sinus tenderness and no frontal sinus tenderness.  Mouth/Throat: Uvula is midline. Posterior oropharyngeal erythema present. No oropharyngeal exudate.  Eyes: Pupils are equal, round, and reactive to light.  Neck: Normal range of motion. Neck supple.  Cardiovascular: Normal rate, regular rhythm and normal heart sounds.  Pulmonary/Chest: Effort normal. No accessory muscle usage. No respiratory distress. He has no decreased breath sounds. He has wheezes (With coughing). He has rhonchi (With coughing).  Musculoskeletal: Normal range of motion.  Lymphadenopathy:    He has no cervical adenopathy.  Neurological: He is alert and oriented to person, place, and time. Gait normal.  Skin: Skin is warm and dry. He is not diaphoretic. No erythema.  Psychiatric: Mood, memory, affect and judgment normal.  Nursing note and vitals reviewed.     Lab Results:  CBC    Component Value Date/Time   WBC 11.4 (H) 07/02/2016 1710   RBC 5.22 07/02/2016 1710   HGB 15.8 07/02/2016 1710   HCT 46.0 07/02/2016 1710   PLT 247.0 07/02/2016 1710   MCV 88.1 07/02/2016 1710   MCH 30.8 10/17/2015 1702   MCHC 34.4 07/02/2016 1710   RDW 12.6 07/02/2016 1710   LYMPHSABS 3.1 07/02/2016 1710   MONOABS 0.6 07/02/2016 1710   EOSABS 0.1 07/02/2016 1710   BASOSABS 0.0 07/02/2016 1710    BMET    Component Value Date/Time   NA 139 10/17/2015 1702   K 3.6 10/17/2015 1702   CL 103 10/17/2015 1702   CO2 27 10/17/2015 1702   GLUCOSE 93 10/17/2015 1702   BUN 10 10/17/2015 1702   CREATININE 0.85 10/17/2015 1702   CALCIUM 9.3 10/17/2015 1702   GFRNONAA >60 10/17/2015 1702    GFRAA >60 10/17/2015 1702    BNP No results found for: BNP  ProBNP No results found for: PROBNP    Assessment & Plan:     Sinobronchitis Assessment: Expiratory breath on lung exam causes coughing Rhonchi and wheezing when coughing Clear to auscultation when not coughing Green sputum produced during exam No recent sick contacts or exposures to known environmental triggers of sinobronchitis  Plan: Treat as sinobronchitis flare with azithromycin and prednisone We will do short course of prednisone as Allen [name] reports that at higher doses he has side effects of insomnia Allen [name] to follow-up with our office sooner if symptoms are not improving Continue Mucinex DM for cough management or can switch to Delsym over-the-counter cough medicine 90-month follow-up with Dr. Elsworth Soho  Allen [name] instructions: Azithromycin 250mg  tablet  >>>Take 2 tablets (500mg  total) today, and then 1 tablet (250mg ) for the next four days  >>>take with food  >>>can also take probiotic and / or yogurt while on antibiotic   Prednisone 10mg  tablet  >>>Take 2 tablets (20 mg total) daily for the next 5 days >>> Take with food in the morning  Only use your albuterol as a rescue medication to be used if you can't catch your breath or wheezing by resting or doing a relaxed purse lip breathing pattern.  - The less you use it, the better it will work when you need it. - Ok to use up to 2 puffs  every 4 hours if you must but call for immediate appointment if use goes up over your usual need - Don't leave home without it !!  (think of it like the spare tire for your car)   Can use Mucinex DM if you need to switch due to GI side effects please start Delsym over-the-counter cough medicine for management of cough  Please make sure you are getting adequate rest as well as hydrating appropriately with water  Can follow-up with Dr. Elsworth Soho in 6 months  Cough variant asthma vs recurrent upper airway cough syndrome Continue to  use your rescue inhaler for shortness of breath or wheezing every 6 hours as needed  Consider spirometry or scheduling pulmonary function testing at next office visit when Allen [name] is stable.  Smokeless tobacco use Assessment: 1 can of smokeless tobacco last 2 to 3 days per Allen [name] Allen [name] is not interested  in stopping use at this time  Plan: We recommend that you stop use When you are ready to stop using smokeless tobacco please contact primary care office for further support  We recommend that you stop using smokeless tobacco.  >>>You need to set a quit date >>>If you have friends or family who smoke, let them know you are trying to quit and not to smoke around you or in your living environment  Smoking Cessation Resources:  1 800 QUIT NOW  >>> Allen [name] to call this resource and utilize it to help support her quit smoking >>> Keep up your hard work with stopping smoking  You can also contact the [facility] >>>For smoking cessation classes call [place], NP 08/15/2018   This appointment was 27 minutes long with over 50% of the time in direct face-to-face Allen [name] care, assessment, plan of care, and follow-up.

## 2018-08-15 ENCOUNTER — Encounter: Payer: Self-pay | Admitting: Pulmonary Disease

## 2018-08-15 ENCOUNTER — Ambulatory Visit: Payer: 59 | Admitting: Pulmonary Disease

## 2018-08-15 VITALS — BP 134/78 | HR 71 | Temp 98.4°F | Ht 69.0 in | Wt 260.4 lb

## 2018-08-15 DIAGNOSIS — J40 Bronchitis, not specified as acute or chronic: Secondary | ICD-10-CM | POA: Diagnosis not present

## 2018-08-15 DIAGNOSIS — J329 Chronic sinusitis, unspecified: Secondary | ICD-10-CM | POA: Diagnosis not present

## 2018-08-15 DIAGNOSIS — Z720 Tobacco use: Secondary | ICD-10-CM

## 2018-08-15 DIAGNOSIS — J45991 Cough variant asthma: Secondary | ICD-10-CM | POA: Diagnosis not present

## 2018-08-15 HISTORY — DX: Tobacco use: Z72.0

## 2018-08-15 MED ORDER — PREDNISONE 10 MG PO TABS: ORAL_TABLET | ORAL | 0 refills | Status: DC

## 2018-08-15 MED ORDER — AZITHROMYCIN 250 MG PO TABS: ORAL_TABLET | ORAL | 0 refills | Status: DC

## 2018-08-15 MED FILL — predniSONE 10 MG TABS: 10 | 5 days supply | Qty: 10 | Fill #0

## 2018-08-15 MED FILL — AZITHROMYCIN 250 MG TABLET: 250 | 5 days supply | Qty: 6 | Fill #0

## 2018-08-15 NOTE — Assessment & Plan Note (Signed)
Assessment: Expiratory breath on lung exam causes coughing Rhonchi and wheezing when coughing Clear to auscultation when not coughing Green sputum produced during exam No recent sick contacts or exposures to known environmental triggers of sinobronchitis  Plan: Treat as sinobronchitis flare with azithromycin and prednisone We will do short course of prednisone as patient reports that at higher doses he has side effects of insomnia Patient to follow-up with our office sooner if symptoms are not improving Continue Mucinex DM for cough management or can switch to Delsym over-the-counter cough medicine 25-month follow-up with Dr. Elsworth Soho  Patient instructions: Azithromycin 250mg  tablet  >>>Take 2 tablets (500mg  total) today, and then 1 tablet (250mg ) for the next four days  >>>take with food  >>>can also take probiotic and / or yogurt while on antibiotic   Prednisone 10mg  tablet  >>>Take 2 tablets (20 mg total) daily for the next 5 days >>> Take with food in the morning  Only use your albuterol as a rescue medication to be used if you can't catch your breath or wheezing by resting or doing a relaxed purse lip breathing pattern.  - The less you use it, the better it will work when you need it. - Ok to use up to 2 puffs  every 4 hours if you must but call for immediate appointment if use goes up over your usual need - Don't leave home without it !!  (think of it like the spare tire for your car)   Can use Mucinex DM if you need to switch due to GI side effects please start Delsym over-the-counter cough medicine for management of cough  Please make sure you are getting adequate rest as well as hydrating appropriately with water  Can follow-up with Dr. Elsworth Soho in 6 months

## 2018-08-15 NOTE — Patient Instructions (Addendum)
Azithromycin 250mg  tablet  >>>Take 2 tablets (500mg  total) today, and then 1 tablet (250mg ) for the next four days  >>>take with food  >>>can also take probiotic and / or yogurt while on antibiotic   Prednisone 10mg  tablet  >>>Take 2 tablets (20 mg total) daily for the next 5 days >>> Take with food in the morning  Only use your albuterol as a rescue medication to be used if you can't catch your breath or wheezing by resting or doing a relaxed purse lip breathing pattern.  - The less you use it, the better it will work when you need it. - Ok to use up to 2 puffs  every 4 hours if you must but call for immediate appointment if use goes up over your usual need - Don't leave home without it !!  (think of it like the spare tire for your car)   Can use Mucinex DM if you need to switch due to GI side effects please start Delsym over-the-counter cough medicine for management of cough  Please make sure you are getting adequate rest as well as hydrating appropriately with water  Can follow-up with Dr. Elsworth Soho in 6 months     It is flu season:   >>>Remember to be washing your hands regularly, using hand sanitizer, be careful to use around herself with has contact with people who are sick will increase her chances of getting sick yourself. >>> Best ways to protect herself from the flu: Receive the yearly flu vaccine, practice good hand hygiene washing with soap and also using hand sanitizer when available, eat a nutritious meals, get adequate rest, hydrate appropriately   Please contact the office if your symptoms worsen or you have concerns that you are not improving.   Thank you for choosing [facility] for your healthcare, and for allowing [place] to partner with you on your healthcare journey. I am thankful to be able to provide care to you today.   Wyn Quaker FNP-C    Acute Bronchitis, Adult  Acute bronchitis is sudden (acute) swelling of the air tubes (bronchi) in the lungs. Acute  bronchitis causes these tubes to fill with mucus, which can make it hard to breathe. It can also cause coughing or wheezing. In adults, acute bronchitis usually goes away within 2 weeks. A cough caused by bronchitis may last up to 3 weeks. Smoking, allergies, and asthma can make the condition worse. Repeated episodes of bronchitis may cause further lung problems, such as chronic obstructive pulmonary disease (COPD). What are the causes? This condition can be caused by germs and by substances that irritate the lungs, including:  Cold and flu viruses. This condition is most often caused by the same virus that causes a cold.  Bacteria.  Exposure to tobacco smoke, dust, fumes, and air pollution. What increases the risk? This condition is more likely to develop in people who:  Have close contact with someone with acute bronchitis.  Are exposed to lung irritants, such as tobacco smoke, dust, fumes, and vapors.  Have a weak immune system.  Have a respiratory condition such as asthma. What are the signs or symptoms? Symptoms of this condition include:  A cough.  Coughing up clear, yellow, or green mucus.  Wheezing.  Chest congestion.  Shortness of breath.  A fever.  Body aches.  Chills.  A sore throat. How is this diagnosed? This condition is usually diagnosed with a physical exam. During the exam, your health care provider may order  tests, such as chest X-rays, to rule out other conditions. He or she may also:  Test a sample of your mucus for bacterial infection.  Check the level of oxygen in your blood. This is done to check for pneumonia.  Do a chest X-ray or lung function testing to rule out pneumonia and other conditions.  Perform blood tests. Your health care provider will also ask about your symptoms and medical history. How is this treated? Most cases of acute bronchitis clear up over time without treatment. Your health care provider may recommend:  Drinking more  fluids. Drinking more makes your mucus thinner, which may make it easier to breathe.  Taking a medicine for a fever or cough.  Taking an antibiotic medicine.  Using an inhaler to help improve shortness of breath and to control a cough.  Using a cool mist vaporizer or humidifier to make it easier to breathe. Follow these instructions at home: Medicines  Take over-the-counter and prescription medicines only as told by your health care provider.  If you were prescribed an antibiotic, take it as told by your health care provider. Do not stop taking the antibiotic even if you start to feel better. General instructions   Get plenty of rest.  Drink enough fluids to keep your urine pale yellow.  Avoid smoking and secondhand smoke. Exposure to cigarette smoke or irritating chemicals will make bronchitis worse. If you smoke and you need help quitting, ask your health care provider. Quitting smoking will help your lungs heal faster.  Use an inhaler, cool mist vaporizer, or humidifier as told by your health care provider.  Keep all follow-up visits as told by your health care provider. This is important. How is this prevented? To lower your risk of getting this condition again:  Wash your hands often with soap and water. If soap and water are not available, use hand sanitizer.  Avoid contact with people who have cold symptoms.  Try not to touch your hands to your mouth, nose, or eyes.  Make sure to get the flu shot every year. Contact a health care provider if:  Your symptoms do not improve in 2 weeks of treatment. Get help right away if:  You cough up blood.  You have chest pain.  You have severe shortness of breath.  You become dehydrated.  You faint or keep feeling like you are going to faint.  You keep vomiting.  You have a severe headache.  Your fever or chills gets worse. This information is not intended to replace advice given to you by your health care provider.  Make sure you discuss any questions you have with your health care provider. Document Released: 09/06/2004 Document Revised: 03/13/2017 Document Reviewed: 01/18/2016 Elsevier Interactive Patient Education  2019 [employer].

## 2018-08-15 NOTE — Assessment & Plan Note (Addendum)
Continue to use your rescue inhaler for shortness of breath or wheezing every 6 hours as needed  Consider spirometry or scheduling pulmonary function testing at next office visit when patient is stable.

## 2018-08-15 NOTE — Assessment & Plan Note (Signed)
Assessment: 1 can of smokeless tobacco last 2 to 3 days per patient Patient is not interested in stopping use at this time  Plan: We recommend that you stop use When you are ready to stop using smokeless tobacco please contact primary care office for further support  We recommend that you stop using smokeless tobacco.  >>>You need to set a quit date >>>If you have friends or family who smoke, let them know you are trying to quit and not to smoke around you or in your living environment  Smoking Cessation Resources:  1 800 QUIT NOW  >>> Patient to call this resource and utilize it to help support her quit smoking >>> Keep up your hard work with stopping smoking  You can also contact the [facility] >>>For smoking cessation classes call [phone]

## 2018-09-01 MED FILL — LOSARTAN-HCTZ 100-25 MG TAB: 100-25 | 30 days supply | Qty: 15 | Fill #0

## 2018-09-08 MED FILL — LATANOPROST 0.005% EYE DRP: 0.005 | 50 days supply | Qty: 3 | Fill #5

## 2018-09-29 DIAGNOSIS — K219 Gastro-esophageal reflux disease without esophagitis: Secondary | ICD-10-CM | POA: Diagnosis not present

## 2018-09-29 DIAGNOSIS — C44519 Basal cell carcinoma of skin of other part of trunk: Secondary | ICD-10-CM | POA: Diagnosis not present

## 2018-09-29 DIAGNOSIS — Z23 Encounter for immunization: Secondary | ICD-10-CM | POA: Diagnosis not present

## 2018-09-29 DIAGNOSIS — G4733 Obstructive sleep apnea (adult) (pediatric): Secondary | ICD-10-CM | POA: Diagnosis not present

## 2018-09-29 DIAGNOSIS — I10 Essential (primary) hypertension: Secondary | ICD-10-CM | POA: Diagnosis not present

## 2018-09-29 DIAGNOSIS — D485 Neoplasm of uncertain behavior of skin: Secondary | ICD-10-CM | POA: Diagnosis not present

## 2018-10-01 MED FILL — LOSARTAN-HCTZ 100-25 MG TAB: 100-25 | 30 days supply | Qty: 15 | Fill #1

## 2018-10-06 MED FILL — ESOMEPRAZOLE MAG DR 40 MG C: 40 | 90 days supply | Qty: 90 | Fill #3

## 2018-10-07 ENCOUNTER — Encounter: Payer: Self-pay | Admitting: Pulmonary Disease

## 2018-10-27 MED FILL — LATANOPROST 0.005% EYE DRP: 0.005 | 50 days supply | Qty: 3 | Fill #6

## 2018-10-30 DIAGNOSIS — C44519 Basal cell carcinoma of skin of other part of trunk: Secondary | ICD-10-CM | POA: Diagnosis not present

## 2018-11-04 MED FILL — LOSARTAN-HCTZ 100-25 MG TAB: 100-25 | 60 days supply | Qty: 30 | Fill #2

## 2018-12-15 DIAGNOSIS — H401111 Primary open-angle glaucoma, right eye, mild stage: Secondary | ICD-10-CM | POA: Diagnosis not present

## 2018-12-15 DIAGNOSIS — H401122 Primary open-angle glaucoma, left eye, moderate stage: Secondary | ICD-10-CM | POA: Diagnosis not present

## 2018-12-16 MED FILL — LATANOPROST 0.005% EYE DRP: 0.005 | 84 days supply | Qty: 5 | Fill #0

## 2018-12-24 DIAGNOSIS — L570 Actinic keratosis: Secondary | ICD-10-CM | POA: Diagnosis not present

## 2018-12-24 DIAGNOSIS — Z85828 Personal history of other malignant neoplasm of skin: Secondary | ICD-10-CM | POA: Diagnosis not present

## 2018-12-24 DIAGNOSIS — D225 Melanocytic nevi of trunk: Secondary | ICD-10-CM | POA: Diagnosis not present

## 2018-12-24 DIAGNOSIS — L821 Other seborrheic keratosis: Secondary | ICD-10-CM | POA: Diagnosis not present

## 2018-12-24 DIAGNOSIS — L814 Other melanin hyperpigmentation: Secondary | ICD-10-CM | POA: Diagnosis not present

## 2018-12-31 MED FILL — LOSARTAN-HCTZ 100-25 MG TAB: 100-25 | 30 days supply | Qty: 15 | Fill #3

## 2019-01-02 MED FILL — ESOMEPRAZOLE MAG DR 40 MG C: 40 | 90 days supply | Qty: 90 | Fill #0

## 2019-02-02 MED FILL — LOSARTAN-HCTZ 100-25 MG TAB: 100-25 | 30 days supply | Qty: 15 | Fill #4

## 2019-02-03 MED FILL — LATANOPROST 0.005% EYE DRP: 0.005 | 75 days supply | Qty: 8 | Fill #0

## 2019-03-03 MED FILL — LOSARTAN-HCTZ 100-25 MG TAB: 100-25 | 30 days supply | Qty: 15 | Fill #5

## 2019-03-09 ENCOUNTER — Other Ambulatory Visit: Payer: Self-pay

## 2019-03-09 ENCOUNTER — Ambulatory Visit (INDEPENDENT_AMBULATORY_CARE_PROVIDER_SITE_OTHER): Payer: 59 | Admitting: Pulmonary Disease

## 2019-03-09 DIAGNOSIS — J45991 Cough variant asthma: Secondary | ICD-10-CM

## 2019-03-09 NOTE — Progress Notes (Signed)
Virtual Visit via Telephone Note  Attempted to reach the patient multiple times throughout the day.  At appointment time patient spouse Tahir Blank into the phone and said this would need to be rescheduled.  They reported they will contact our office back to schedule this appointment.  Location: Patient: Home Provider: [facility] - [id number], [place], [place], [place] [address]   I discussed the limitations, risks, security and privacy concerns of performing an evaluation and management service by telephone and the availability of in person appointments. I also discussed with the patient that there may be a patient responsible charge related to this service. The patient expressed understanding and agreed to proceed.  Patient consented to consult via telephone: Yes People present and their role in pt care: Pt     History of Present Illness: 53 year old male former smoker, current smokeless tobacco user followed in our office for upper airway cough syndrome  PMH: GERD Smoker/ Smoking History: Former smoker.  Quit 1985. Current smokeless tobacco.  Maintenance: None Pt of: Dr. Elsworth Soho  Chief complaint: none   Patient was unable to be reached, they reported they will reschedule their appointment    Observations/Objective:  07/02/2016-CBC with differential- eosinophils relative 1.3, eosinophils absolute 0.1  07/02/2016-respiratory allergy panel- negative for allergy elevations, IgE 22  09/27/2017-echocardiogram-LV ejection fraction 55 to 60%, mild LVH, grade 1 diastolic dysfunction  Assessment and Plan:  No problem-specific Assessment & Plan notes found for this encounter.   Follow Up Instructions:  Return if symptoms worsen or fail to improve, for Follow up with Dr. Elsworth Soho.   I discussed the assessment and treatment plan with the patient. The patient was provided an opportunity to ask questions and all were answered. The patient agreed with the plan and  demonstrated an understanding of the instructions.   The patient was advised to call back or seek an in-person evaluation if the symptoms worsen or if the condition fails to improve as anticipated.  I provided 0 minutes of non-face-to-face time during this encounter.   Lauraine Rinne, NP

## 2019-03-10 ENCOUNTER — Emergency Department (HOSPITAL_COMMUNITY)
Admission: EM | Admit: 2019-03-10 | Discharge: 2019-03-10 | Disposition: A | Discharge status: Home / Self Care | Payer: 59 | Attending: Emergency Medicine | Admitting: Emergency Medicine

## 2019-03-10 ENCOUNTER — Encounter (HOSPITAL_COMMUNITY): Payer: Self-pay

## 2019-03-10 ENCOUNTER — Emergency Department (HOSPITAL_COMMUNITY): Payer: 59

## 2019-03-10 ENCOUNTER — Telehealth: Payer: Self-pay | Admitting: Pulmonary Disease

## 2019-03-10 ENCOUNTER — Other Ambulatory Visit: Payer: Self-pay

## 2019-03-10 DIAGNOSIS — R042 Hemoptysis: Secondary | ICD-10-CM | POA: Diagnosis not present

## 2019-03-10 DIAGNOSIS — J45909 Unspecified asthma, uncomplicated: Secondary | ICD-10-CM | POA: Diagnosis not present

## 2019-03-10 DIAGNOSIS — R0602 Shortness of breath: Secondary | ICD-10-CM | POA: Diagnosis not present

## 2019-03-10 DIAGNOSIS — Z87891 Personal history of nicotine dependence: Secondary | ICD-10-CM | POA: Diagnosis not present

## 2019-03-10 DIAGNOSIS — Z79899 Other long term (current) drug therapy: Secondary | ICD-10-CM | POA: Diagnosis not present

## 2019-03-10 DIAGNOSIS — R05 Cough: Secondary | ICD-10-CM | POA: Diagnosis not present

## 2019-03-10 DIAGNOSIS — R0902 Hypoxemia: Secondary | ICD-10-CM | POA: Diagnosis not present

## 2019-03-10 DIAGNOSIS — I10 Essential (primary) hypertension: Secondary | ICD-10-CM | POA: Insufficient documentation

## 2019-03-10 DIAGNOSIS — R58 Hemorrhage, not elsewhere classified: Secondary | ICD-10-CM | POA: Diagnosis not present

## 2019-03-10 LAB — CBC
HCT: 47.8 % (ref 39.0–52.0)
Hemoglobin: 16.2 g/dL (ref 13.0–17.0)
MCH: 30.9 pg (ref 26.0–34.0)
MCHC: 33.9 g/dL (ref 30.0–36.0)
MCV: 91.2 fL (ref 80.0–100.0)
Platelets: 161 10*3/uL (ref 150–400)
RBC: 5.24 MIL/uL (ref 4.22–5.81)
RDW: 13.2 % (ref 11.5–15.5)
WBC: 6.6 10*3/uL (ref 4.0–10.5)
nRBC: 0 % (ref 0.0–0.2)

## 2019-03-10 LAB — BASIC METABOLIC PANEL
Anion gap: 10 (ref 5–15)
BUN: 20 mg/dL (ref 6–20)
CO2: 25 mmol/L (ref 22–32)
Calcium: 9 mg/dL (ref 8.9–10.3)
Chloride: 106 mmol/L (ref 98–111)
Creatinine, Ser: 0.77 mg/dL (ref 0.61–1.24)
GFR calc Af Amer: >60 mL/min (ref >60)
GFR calc non Af Amer: >60 mL/min (ref >60)
Glucose, Bld: 110 mg/dL — ABNORMAL HIGH (ref 70–99)
Potassium: 4.2 mmol/L (ref 3.5–5.1)
Sodium: 141 mmol/L (ref 135–145)

## 2019-03-10 MED ORDER — IOHEXOL 350 MG/ML SOLN
100.0000 mL | Freq: Once | INTRAVENOUS | Status: AC | PRN
  Administered 2019-03-10: 12:00:00 100 mL via INTRAVENOUS

## 2019-03-10 MED ORDER — SODIUM CHLORIDE (PF) 0.9 % IJ SOLN
INTRAMUSCULAR | Status: AC
  Administered 2019-03-10: 13:00:00
  Filled 2019-03-10: qty 50

## 2019-03-10 MED ORDER — IOHEXOL 300 MG/ML  SOLN: 100.0000 mL | Freq: Once | INTRAMUSCULAR | Status: DC | PRN

## 2019-03-10 NOTE — Telephone Encounter (Signed)
Called and spoke with pt. Stated to him that we needed to get him scheduled for an appt to reassess after recent ER visit and pt verbalized understanding. Pt has been scheduled an appt with Aaron Edelman Friday 7/31 at 4pm. Nothing further needed.

## 2019-03-10 NOTE — ED Triage Notes (Signed)
Patient c/o coughing up blood since 5:30 am this morning.    Patient states usually when he wakes up and showers he clears out his lungs and has blood come up but this morning it has not stopped.      A/Ox4  Hx. GERD, Hypertension, and Asthma.    Ambulatory in triage.  Denies pain.

## 2019-03-10 NOTE — Telephone Encounter (Signed)
-----   Message from Amaryllis Dyke, [place] sent at 03/10/2019  1:17 PM EDT ----- Regarding: Follow-up ER visit for hemoptysis Hello,  I am reaching out to try to facilitate close follow up for Mr. Coba. He was seen in the Emergency Department today 03/10/19 at [facility] for gross hemoptysis. His work-up was overall reassuring with normal labs & a CTA which was negative for obvious PE or infectious process. He has had improvement in his symptoms throughout his stay here with resolution of his hemoptysis in the department. If your office could call him within the next 48 hours to make sure he remains well we would very much appreciate it and or to see him as you see fit!   Thank you so much & have a great day!  - Sammy Petrucelli PA-C

## 2019-03-10 NOTE — Telephone Encounter (Signed)
03/10/2019 1334  Triage,  Please see note listed below.  Patient was recently in the emergency room for hemoptysis.  Please get patient scheduled with APP or Dr. Elsworth Soho within the 2 to 3 days.   Wyn Quaker FNP

## 2019-03-10 NOTE — ED Provider Notes (Signed)
[name] Provider Note   CSN: [id number] Arrival date & time: 03/10/19  0746     History   Chief Complaint Chief Complaint  Patient presents with  . Hemoptysis    HPI [name] is a 53 y.o. male with a hx of asthma, hypertension, OSA, obesity, & kidney stones who presents to the ED w/ complaints of hemoptysis that began this AM when he woke up. Patient states that he has mild congestion w/ coughing up phlegm each morning @ baseline, however this AM he started coughing up blood, states it was a large amount with clots @ first but has gradually improved. States this is more mild now. No alleviating/aggravating factors. Felt a bit more short of breath with coughing spells but this has improved. Denies fever, chills, chest pain, vomiting, diarrhea, melena, or hematochezia. Denies leg pain/swelling, recent surgery/trauma, recent long travel (however does travel for 3-6 hours @ a time for work, but stops often), hormone use, personal hx of cancer, or hx of DVT/PE. No recent foreign travel.     HPI  Past Medical History:  Diagnosis Date  . Asthma    Dr. Laurann Montana  . Hypertension   . Kidney stones   . OSA (obstructive sleep apnea)   . Renal disorder     Patient Active Problem List   Diagnosis Date Noted  . Smokeless tobacco use 08/15/2018  . Morbid obesity due to excess calories ([place]) 07/06/2016  . Essential hypertension 07/06/2016  . Rash 08/02/2014  . Sinobronchitis 07/26/2014  . Cough variant asthma vs recurrent upper airway cough syndrome 10/16/2013    History reviewed. No pertinent surgical history.      Home Medications    Prior to Admission medications   Medication Sig Start Date End Date Taking? Authorizing Provider  albuterol (PROAIR HFA) 108 (90 Base) MCG/ACT inhaler Inhale 2 puffs into the lungs every 6 (six) hours as needed for wheezing or shortness of breath. 05/02/18   Martyn Ehrich, NP  azithromycin (ZITHROMAX) 250  MG tablet 500mg  (two tablets) today, then 250mg  (1 tablet) for the next 4 days 08/15/18   Lauraine Rinne, NP  esomeprazole (NEXIUM) 40 MG capsule Take 40 mg by mouth every evening.     [provider]  guaiFENesin (MUCINEX) 600 MG 12 hr tablet Take by mouth 2 (two) times daily.    [provider]  losartan-hydrochlorothiazide (HYZAAR) 50-12.5 MG per tablet Take 1 tablet by mouth daily.    [provider]  mometasone (NASONEX) 50 MCG/ACT nasal spray Place 2 sprays into the nose daily.    [provider]  Multiple Vitamin (MULTIVITAMIN) tablet Take 1 tablet by mouth daily.    [provider]  predniSONE (DELTASONE) 10 MG tablet Take 2 tablets (20mg  total) daily for the next 5 days. Take in the AM with food. 08/15/18   Lauraine Rinne, NP    Family History Family History  Problem Relation Age of Onset  . Hypertension Mother   . Colon cancer Mother   . Heart disease Father     Social History Social History   Tobacco Use  . Smoking status: Former Smoker    Packs/day: 1.00    Years: 1.00    Pack years: 1.00    Types: Cigarettes    Quit date: 08/14/1983    Years since quitting: 35.5  . Smokeless tobacco: Current User    Types: Chew  . Tobacco comment: dip  Substance Use Topics  .  Alcohol use: Yes    Alcohol/week: 0.0 standard drinks    Comment: occ.  . Drug use: No     Allergies   Neosporin [neomycin-bacitracin zn-polymyx], Penicillins, and Tramadol hcl   Review of Systems Review of Systems  Constitutional: Negative for chills and fever.  HENT: Positive for congestion. Negative for ear pain and sore throat.   Respiratory: Positive for cough and shortness of breath (resolved).        + for hemoptysis.   Cardiovascular: Negative for chest pain and leg swelling.  Gastrointestinal: Negative for abdominal pain, blood in stool, diarrhea and vomiting.  Neurological: Negative for dizziness and syncope.  All other systems reviewed and are  negative.    Physical Exam Updated Vital Signs BP (!) 149/96 (BP Location: Left Arm)   Pulse 79   Temp 98.2 F (36.8 C) (Oral)   Resp 20   Ht 5\' 9"  (1.753 m)   Wt 124.7 kg   SpO2 99%   BMI 40.61 kg/m   Physical Exam Vitals signs and nursing note reviewed.  Constitutional:      General: He is not in acute distress.    Appearance: He is well-developed. He is not toxic-appearing.     Comments: Patient has bag with contents of what he has been coughing up- there is mild gross hemoptysis.   HENT:     Head: Normocephalic and atraumatic.     Right Ear: Tympanic membrane normal.     Left Ear: Tympanic membrane normal.     Nose: Nose normal.     Mouth/Throat:     Mouth: Mucous membranes are moist.     Pharynx: No oropharyngeal exudate or posterior oropharyngeal erythema.  Eyes:     General:        Right eye: No discharge.        Left eye: No discharge.     Conjunctiva/sclera: Conjunctivae normal.     Pupils: Pupils are equal, round, and reactive to light.  Neck:     Musculoskeletal: Neck supple.  Cardiovascular:     Rate and Rhythm: Normal rate and regular rhythm.  Pulmonary:     Effort: Pulmonary effort is normal. No respiratory distress.     Breath sounds: Normal breath sounds. No wheezing, rhonchi or rales.  Abdominal:     General: There is no distension.     Palpations: Abdomen is soft.     Tenderness: There is no abdominal tenderness. There is no guarding or rebound.  Musculoskeletal:     Comments: Trace symmetric edema to the lower legs. No overlying erythema/warmth. Calves are nontender.   Skin:    General: Skin is warm and dry.     Findings: No rash.  Neurological:     Mental Status: He is alert.     Comments: Clear speech.   Psychiatric:        Behavior: Behavior normal.    ED Treatments / Results  Labs (all labs ordered are listed, but only abnormal results are displayed) Labs Reviewed  BASIC METABOLIC PANEL - Abnormal; Notable for the following  components:      Result Value   Glucose, Bld 110 (*)    All other components within normal limits  CBC    EKG None  Radiology Dg Chest 2 View  Result Date: 03/10/2019 CLINICAL DATA:  Onset hemoptysis at 5:30 a.m. EXAM: CHEST - 2 VIEW COMPARISON:  PA and lateral chest 07/26/2014. FINDINGS: Lungs clear. Heart size normal. No pneumothorax or pleural fluid.  No acute or focal bony abnormality. IMPRESSION: No acute disease. Electronically Signed   By: Inge Rise M.D.   On: 03/10/2019 09:57   Ct Angio Chest Pe W/cm &/or Wo Cm  Result Date: 03/10/2019 CLINICAL DATA:  Hemoptysis EXAM: CT ANGIOGRAPHY CHEST WITH CONTRAST TECHNIQUE: Multidetector CT imaging of the chest was performed using the standard protocol during bolus administration of intravenous contrast. Multiplanar CT image reconstructions and MIPs were obtained to evaluate the vascular anatomy. CONTRAST:  125mL OMNIPAQUE IOHEXOL 350 MG/ML SOLN COMPARISON:  None. FINDINGS: Cardiovascular: Examination for pulmonary embolism is limited by breath motion artifact, particularly in the lung bases. Within this limitation, no evidence of pulmonary embolism through the proximal segmental pulmonary arterial level. No evidence of pulmonary embolism. Normal heart size. Three-vessel coronary artery calcifications. No pericardial effusion. Mediastinum/Nodes: No enlarged mediastinal, hilar, or axillary lymph nodes. Thyroid gland, trachea, and esophagus demonstrate no significant findings. Lungs/Pleura: Lungs are clear. No pleural effusion or pneumothorax. Upper Abdomen: No acute abnormality.  Hepatic steatosis. Musculoskeletal: No chest wall abnormality. No acute or significant osseous findings. Review of the MIP images confirms the above findings. IMPRESSION: 1. Examination for pulmonary embolism is limited by breath motion artifact, particularly in the lung bases. Within this limitation, no evidence of pulmonary embolism through the proximal segmental  pulmonary arterial level. 2.  Coronary artery disease. 3.  Hepatic steatosis. Electronically Signed   By: Eddie Candle M.D.   On: 03/10/2019 12:22    Procedures Procedures (including critical care time)  Medications Ordered in ED Medications - No data to display   Initial Impression / Assessment and Plan / ED Course  I have reviewed the triage vital signs and the nursing notes.  Pertinent labs & imaging results that were available during my care of the patient were reviewed by me and considered in my medical decision making (see chart for details).   Patient presents to the ED w/ hemoptysis that began this AM. He is nontoxic appearing, in no apparent distress, vitals WNL other than elevated BP- doubt HTN emergency. Exam w/ clear lungs, trace symmetric edema to LEs. He does have emesis bag @ bedside that is consistent w/ frank hemoptysis. CXR per triage without acute abnormality. Plan for labs & CTA of the chest.   CBC: No anemia/leukocytosis.  BMP: No significant abnormality.  CTA:  IMPRESSION: 1. Examination for pulmonary embolism is limited by breath motion artifact, particularly in the lung bases. Within this limitation, no evidence of pulmonary embolism through the proximal segmental pulmonary arterial level. 2.  Coronary artery disease. 3.  Hepatic steatosis  CTA w/o obvious PE or significant source of bleeding identified, no pneumonia. He has not had any travel or exposures to raise concern for TB. Unclear definitive cause of sxs, may have been related to his typical coughing spells in the AM. His hemoptysis has resolved in the ED, lungs remain clear, vitals without significant change throughout ED course, appears appropriate for discharge home with close follow up with his pulmonologist. I discussed results, treatment plan, need for follow-up, and return precautions with the patient. Provided opportunity for questions, patient confirmed understanding and is in agreement with plan.     Findings and plan of care discussed with supervising physician Dr. Venora Maples who is in agreement.   Final Clinical Impressions(s) / ED Diagnoses   Final diagnoses:  Hemoptysis    ED Discharge Orders    None       Amaryllis Dyke, PA-C 03/10/19 [place],  Lennette Bihari, MD 03/11/19 [phone]

## 2019-03-10 NOTE — Discharge Instructions (Signed)
You were seen in the emergency department today for coughing up blood.  Is unclear exactly why this happened today.  Your CT scan did not show signs of a large blood clot.  He did show some coronary artery disease and fatty liver changes, these are things that should be followed by primary care within the next 1 to 2 weeks.  We would like you to call your pulmonology office today or tomorrow to make it. Within the next 3 days.  Return to the ER for new or worsening symptoms including but not limited to return of coughing up blood, fever, trouble breathing, chest pain, passing out, dizziness, lightheadedness, or any other concerns.

## 2019-03-12 ENCOUNTER — Other Ambulatory Visit: Payer: Self-pay

## 2019-03-12 ENCOUNTER — Ambulatory Visit: Payer: 59 | Admitting: Pulmonary Disease

## 2019-03-12 ENCOUNTER — Encounter: Payer: Self-pay | Admitting: Pulmonary Disease

## 2019-03-12 VITALS — BP 126/90 | HR 73 | Temp 98.0°F | Ht 70.0 in | Wt 265.0 lb

## 2019-03-12 DIAGNOSIS — J329 Chronic sinusitis, unspecified: Secondary | ICD-10-CM

## 2019-03-12 DIAGNOSIS — J40 Bronchitis, not specified as acute or chronic: Secondary | ICD-10-CM | POA: Diagnosis not present

## 2019-03-12 DIAGNOSIS — R042 Hemoptysis: Secondary | ICD-10-CM | POA: Insufficient documentation

## 2019-03-12 DIAGNOSIS — Z720 Tobacco use: Secondary | ICD-10-CM | POA: Diagnosis not present

## 2019-03-12 HISTORY — DX: Hemoptysis: R04.2

## 2019-03-12 LAB — CBC WITH DIFFERENTIAL/PLATELET
Basophils Absolute: 0 10*3/uL (ref 0.0–0.1)
Basophils Relative: 0.5 % (ref 0.0–3.0)
Eosinophils Absolute: 0.1 10*3/uL (ref 0.0–0.7)
Eosinophils Relative: 2 % (ref 0.0–5.0)
HCT: 43.5 % (ref 39.0–52.0)
Hemoglobin: 14.8 g/dL (ref 13.0–17.0)
Lymphocytes Relative: 31.1 % (ref 12.0–46.0)
Lymphs Abs: 2.1 10*3/uL (ref 0.7–4.0)
MCHC: 34.1 g/dL (ref 30.0–36.0)
MCV: 89.8 fl (ref 78.0–100.0)
Monocytes Absolute: 0.5 10*3/uL (ref 0.1–1.0)
Monocytes Relative: 7.1 % (ref 3.0–12.0)
Neutro Abs: 3.9 10*3/uL (ref 1.4–7.7)
Neutrophils Relative %: 59.3 % (ref 43.0–77.0)
Platelets: 171 10*3/uL (ref 150.0–400.0)
RBC: 4.85 Mil/uL (ref 4.22–5.81)
RDW: 13.9 % (ref 11.5–15.5)
WBC: 6.7 10*3/uL (ref 4.0–10.5)

## 2019-03-12 LAB — D-DIMER, QUANTITATIVE: D-Dimer, Quant: 0.42 mcg/mL FEU (ref <0.50)

## 2019-03-12 MED ORDER — HYDROCOD POLST-CPM POLST ER 10-8 MG/5ML PO SUER: 5.0000 mL | Freq: Every evening | ORAL | 0 refills | Status: DC | PRN

## 2019-03-12 MED ORDER — DOXYCYCLINE HYCLATE 100 MG PO TABS: 100.0000 mg | ORAL_TABLET | Freq: Two times a day (BID) | ORAL | 0 refills | Status: DC

## 2019-03-12 MED ORDER — PREDNISONE 10 MG PO TABS: ORAL_TABLET | ORAL | 0 refills | Status: DC

## 2019-03-12 MED FILL — HYDROCODONE-CHLORPHEN ER SU: 10-8 | 23 days supply | Qty: 115 | Fill #0

## 2019-03-12 MED FILL — DOXYCYCLINE HYCLATE 100 MG: 100 | 7 days supply | Qty: 14 | Fill #0

## 2019-03-12 MED FILL — predniSONE 10 MG TABS: 10 | 5 days supply | Qty: 10 | Fill #0

## 2019-03-12 NOTE — Progress Notes (Signed)
@Patient  ID: [name], male    DOB: March 09, 1966, 53 y.o.   MRN: [id number]  Chief Complaint  Patient presents with   Follow-up    follow up after ED visit for hemoptysis    Referring provider: Lavone Orn, MD  HPI:  53 year old male former smoker, current smokeless tobacco user followed in our office for upper airway cough syndrome  PMH: GERD Smoker/ Smoking History: Former smoker.  Quit 1985. Current smokeless tobacco.  Maintenance: None Pt of: Dr. Elsworth Soho  03/12/2019  - Visit   53 year old male former smoker, current smokeless tobacco user followed in our office for upper airway cough syndrome as well as recurrent sinus infections.  Patient typically has 1-2 sinus infections yearly requiring prednisone and antibiotics.  Patient had a tele-visit scheduled our office earlier this week which he canceled.  That same night he presented to the emergency room for hemoptysis.  Chest x-ray was clear, CTA chest was also negative for PE based off of images.  Patient was discharged from the emergency room and instructed follow-up closely with our office.  ED provider followed up closely with our office to ensure the patient was scheduled for follow-up.  Patient moved his appointment up to today to accommodate his spouse who also had an appointment in our office.  Patient reported that he feels that he is back to his baseline with the exception of he had 1 more episode of hemoptysis last night.  Patient is unable to quantify how much blood he actually coughed up.  Patient has a vigorous cough and he reports that he will cough sometimes still he is sick.  Primary care prescribed Tessalon Perles to the patient last night.  Patient also has been using Nasonex nasal steroids and he has had a right nasal bleed last night.  Patient feels that his postnasal drip has significantly increased.  He is not blowing out any discolored sputum but postnasal drip and coughing is bloody with white to yellow thick  mucus.   Patient is not on any blood thinners.  Vital signs today are stable.     Tests:   07/02/2016-CBC with differential- eosinophils relative 1.3, eosinophils absolute 0.1  07/02/2016-respiratory allergy panel- negative for allergy elevations, IgE 22  09/27/2017-echocardiogram-LV ejection fraction 55 to 60%, mild LVH, grade 1 diastolic dysfunction  [id number] 16.2  03/10/2019-CTA chest- examination of PE was limited by breath and motion, particularly in lung bases.  Within this limitation no evidence of PE through the proximal segmental pulmonary arterial level, CAD  FENO:  No results found for: NITRICOXIDE  PFT: No flowsheet data found.  Imaging: Dg Chest 2 View  Result Date: 03/10/2019 CLINICAL DATA:  Onset hemoptysis at 5:30 a.m. EXAM: CHEST - 2 VIEW COMPARISON:  PA and lateral chest 07/26/2014. FINDINGS: Lungs clear. Heart size normal. No pneumothorax or pleural fluid. No acute or focal bony abnormality. IMPRESSION: No acute disease. Electronically Signed   By: Inge Rise M.D.   On: 03/10/2019 09:57   Ct Angio Chest Pe W/cm &/or Wo Cm  Result Date: 03/10/2019 CLINICAL DATA:  Hemoptysis EXAM: CT ANGIOGRAPHY CHEST WITH CONTRAST TECHNIQUE: Multidetector CT imaging of the chest was performed using the standard protocol during bolus administration of intravenous contrast. Multiplanar CT image reconstructions and MIPs were obtained to evaluate the vascular anatomy. CONTRAST:  175mL OMNIPAQUE IOHEXOL 350 MG/ML SOLN COMPARISON:  None. FINDINGS: Cardiovascular: Examination for pulmonary embolism is limited by breath motion artifact, particularly in the lung bases. Within this limitation, no evidence  of pulmonary embolism through the proximal segmental pulmonary arterial level. No evidence of pulmonary embolism. Normal heart size. Three-vessel coronary artery calcifications. No pericardial effusion. Mediastinum/Nodes: No enlarged mediastinal, hilar, or axillary lymph  nodes. Thyroid gland, trachea, and esophagus demonstrate no significant findings. Lungs/Pleura: Lungs are clear. No pleural effusion or pneumothorax. Upper Abdomen: No acute abnormality.  Hepatic steatosis. Musculoskeletal: No chest wall abnormality. No acute or significant osseous findings. Review of the MIP images confirms the above findings. IMPRESSION: 1. Examination for pulmonary embolism is limited by breath motion artifact, particularly in the lung bases. Within this limitation, no evidence of pulmonary embolism through the proximal segmental pulmonary arterial level. 2.  Coronary artery disease. 3.  Hepatic steatosis. Electronically Signed   By: Eddie Candle M.D.   On: 03/10/2019 12:22      Specialty Problems      Pulmonary Problems   Cough variant asthma vs recurrent upper airway cough syndrome    post bronchitic or related to reflux -  07/26/2014 p extensive coaching HFA effectiveness =    90%  - Allergy profile 07/02/16  >  Eos 0.1 /  IgE 22  RAST neg        Sinobronchitis   Hemoptysis    03/10/2019-CBC-hemoglobin 16.2  03/10/2019-CTA chest- examination of PE was limited by breath and motion, particularly in lung bases.  Within this limitation no evidence of PE through the proximal segmental pulmonary arterial level, CAD          Allergies  Allergen Reactions   Neosporin [Neomycin-Bacitracin Zn-Polymyx]     unknown   Penicillins     Did it involve swelling of the face/tongue/throat, SOB, or low BP? Unknown Did it involve sudden or severe rash/hives, skin peeling, or any reaction on the inside of your mouth or nose? Unknown Did you need to seek medical attention at a hospital or doctor's office? Unknown When did it last happen?Childhood  If all above answers are NO, may proceed with cephalosporin use.  Unknown reaction   Tramadol Hcl Hives and Itching    Immunization History  Administered Date(s) Administered   Influenza Split 05/13/2013, 05/13/2017,  05/15/2018   Influenza,inj,Quad PF,6+ Mos 05/19/2015, 05/13/2016   Influenza-Unspecified 06/01/2014    Past Medical History:  Diagnosis Date   Asthma    Dr. Laurann Montana   Hypertension    Kidney stones    OSA (obstructive sleep apnea)    Renal disorder     Tobacco History: Social History   Tobacco Use  Smoking Status Former Smoker   Packs/day: 1.00   Years: 1.00   Pack years: 1.00   Types: Cigarettes   Quit date: 08/14/1983   Years since quitting: 35.6  Smokeless Tobacco Current User   Types: Chew  Tobacco Comment   dip   Ready to quit: Not Answered Counseling given: Not Answered Comment: dip   Continue to not smoke  Outpatient Encounter Medications as of 03/12/2019  Medication Sig   albuterol (PROAIR HFA) 108 (90 Base) MCG/ACT inhaler Inhale 2 puffs into the lungs every 6 (six) hours as needed for wheezing or shortness of breath.   benzonatate (TESSALON PERLES) 100 MG capsule Take by mouth 3 (three) times daily as needed for cough.   esomeprazole (NEXIUM) 40 MG capsule Take 40 mg by mouth every evening.    latanoprost (XALATAN) 0.005 % ophthalmic solution Place 1 drop into both eyes at bedtime.    losartan-hydrochlorothiazide (HYZAAR) 100-25 MG tablet Take 0.5 tablets by mouth daily.  mometasone (NASONEX) 50 MCG/ACT nasal spray Place 2 sprays into the nose daily.   Multiple Vitamin (MULTIVITAMIN) tablet Take 1 tablet by mouth daily.   chlorpheniramine-HYDROcodone (TUSSIONEX PENNKINETIC ER) 10-8 MG/5ML SUER Take 5 mLs by mouth at bedtime as needed for cough.   doxycycline (VIBRA-TABS) 100 MG tablet Take 1 tablet (100 mg total) by mouth 2 (two) times daily.   predniSONE (DELTASONE) 10 MG tablet Take 2 tablets (20mg  total) daily for the next 5 days. Take in the AM with food.   No facility-administered encounter medications on file as of 03/12/2019.      Review of Systems  Review of Systems  Constitutional: Positive for fatigue. Negative for  activity change, chills, fever and unexpected weight change.  HENT: Positive for congestion, nosebleeds, postnasal drip, sinus pressure and sinus pain. Negative for rhinorrhea and sore throat.   Eyes: Negative.   Respiratory: Positive for shortness of breath. Negative for cough and wheezing.   Cardiovascular: Negative for chest pain, palpitations and leg swelling.  Gastrointestinal: Negative for constipation, diarrhea, nausea and vomiting.  Endocrine: Negative.   Genitourinary: Negative.   Musculoskeletal: Negative.   Skin: Negative.   Neurological: Negative for dizziness, light-headedness and headaches.  Psychiatric/Behavioral: Negative.  Negative for dysphoric mood. The patient is not nervous/anxious.   All other systems reviewed and are negative.    Physical Exam  BP 126/90 (BP Location: Left Arm, Patient Position: Sitting, Cuff Size: Normal)    Pulse 73    Temp 98 F (36.7 C)    Ht 5\' 10"  (1.778 m)    Wt 265 lb (120.2 kg)    SpO2 96%    BMI 38.02 kg/m   Wt Readings from Last 5 Encounters:  03/12/19 265 lb (120.2 kg)  03/10/19 275 lb (124.7 kg)  08/15/18 260 lb 6.4 oz (118.1 kg)  05/02/18 258 lb 3.2 oz (117.1 kg)  09/09/17 269 lb (122 kg)     Physical Exam Vitals signs and nursing note reviewed.  Constitutional:      General: He is not in acute distress.    Appearance: Normal appearance. He is obese.  HENT:     Head: Normocephalic and atraumatic.     Right Ear: Hearing, tympanic membrane, ear canal and external ear normal.     Left Ear: Hearing, tympanic membrane, ear canal and external ear normal.     Nose: Mucosal edema, congestion and rhinorrhea present.     Right Nostril: Epistaxis present.     Right Turbinates: Enlarged and swollen.     Left Turbinates: Not enlarged.     Right Sinus: Maxillary sinus tenderness present.     Comments: Right ethmoid tenderness     Mouth/Throat:     Mouth: Mucous membranes are dry.     Dentition: Abnormal dentition. Dental caries  present.     Pharynx: Oropharynx is clear. No oropharyngeal exudate.     Comments: Postnasal drip Current smokeless tobacco user Eyes:     Pupils: Pupils are equal, round, and reactive to light.  Neck:     Musculoskeletal: Normal range of motion.  Cardiovascular:     Rate and Rhythm: Normal rate and regular rhythm.     Pulses: Normal pulses.     Heart sounds: Normal heart sounds. No murmur.  Pulmonary:     Effort: Pulmonary effort is normal.     Breath sounds: Normal breath sounds. No decreased breath sounds, wheezing or rales.  Abdominal:     General: Bowel sounds are normal.  There is no distension.     Palpations: Abdomen is soft.     Tenderness: There is no abdominal tenderness.     Comments: Obese  Musculoskeletal:     Right lower leg: No edema.     Left lower leg: No edema.  Lymphadenopathy:     Cervical: No cervical adenopathy.  Skin:    General: Skin is warm and dry.     Capillary Refill: Capillary refill takes less than 2 seconds.     Findings: No erythema or rash.  Neurological:     General: No focal deficit present.     Mental Status: He is alert and oriented to person, place, and time.     Motor: No weakness.     Coordination: Coordination normal.     Gait: Gait is intact. Gait normal.  Psychiatric:        Mood and Affect: Mood normal.        Behavior: Behavior normal. Behavior is cooperative.        Thought Content: Thought content normal.        Judgment: Judgment normal.      Lab Results:  CBC    Component Value Date/Time   WBC 6.7 03/12/2019 1022   RBC 4.85 03/12/2019 1022   HGB 14.8 03/12/2019 1022   HCT 43.5 03/12/2019 1022   PLT 171.0 03/12/2019 1022   MCV 89.8 03/12/2019 1022   MCH 30.9 03/10/2019 1044   MCHC 34.1 03/12/2019 1022   RDW 13.9 03/12/2019 1022   LYMPHSABS 2.1 03/12/2019 1022   MONOABS 0.5 03/12/2019 1022   EOSABS 0.1 03/12/2019 1022   BASOSABS 0.0 03/12/2019 1022    BMET    Component Value Date/Time   NA 141 03/10/2019  1044   K 4.2 03/10/2019 1044   CL 106 03/10/2019 1044   CO2 25 03/10/2019 1044   GLUCOSE 110 (H) 03/10/2019 1044   BUN 20 03/10/2019 1044   CREATININE 0.77 03/10/2019 1044   CALCIUM 9.0 03/10/2019 1044   GFRNONAA >60 03/10/2019 1044   GFRAA >60 03/10/2019 1044    BNP No results found for: BNP  ProBNP No results found for: PROBNP    Assessment & Plan:   Hemoptysis Likely related to Nasonex use and epistaxis from sinus infection  Difficult to fully visualize CTA chest based off of radiology notes.  Examination is limited by breath and motion.  Plan: D-dimer and CBC today Limit Nasonex use Will treat as sinus infection today Doxycycline Short course of prednisone Cough suppression-Tessalon Perles and Delsym during the day, Tussionex at night Nasal saline rinses 1-2 times daily  Smokeless tobacco use Plan: We recommend that you stop using smokeless tobacco With patient smokeless tobacco use if he continues to have nasal bleeds may need to consider imaging  Sinobronchitis Clear breath sounds on exam today Increased postnasal drip, recent hemoptysis  Plan: We will treat sinobronchitis flare with doxycycline and prednisone Cough suppression: Tessalon and Delsym during the day for cough management Tussionex cough medicine at night Nasal saline rinses 1-2 times daily Limit Nasonex use     Return in about 3 months (around 06/12/2019), or if symptoms worsen or fail to improve, for Follow up with Dr. Elsworth Soho.   Lauraine Rinne, NP 03/12/2019   This appointment was 30 minutes long with over 50% of the time in direct face-to-face patient care, assessment, plan of care, and follow-up.

## 2019-03-12 NOTE — Patient Instructions (Addendum)
Labwork today   Doxycycline >>> 1 100 mg tablet every 12 hours for 7 days >>>take with food  >>>wear sunscreen   Prednisone 10mg  tablet  >>>Take 2 tablets (20 mg total) daily for the next 5 days >>> Take with food in the morning   Cough Home Instructions:   Try your best not to cough more vigorously cough which could dislodge healing areas of your throat and nose resulting in clots breaking away and recurrent bleeding  Try to limit Nasonex use  Start nasal saline rinses 2 times daily to help with nasal drainage as well as keeping nasal passages moist  . Medications to use:  o Mucinex DM 1-2 every 12 hrs or Delsym 2 tsp every 12 hrs for cough (These are Over the counter) o Tessalon Three times a day  As needed  Cough.  o Prilosec 20mg  30 min before breakfast or dinner.  o Tussionex at night  - This is sedating   Contact our office with any concerns or questions  Return in about 3 months (around 06/12/2019), or if symptoms worsen or fail to improve, for Follow up with Dr. Elsworth Soho.   Coronavirus (COVID-19) Are you at risk?  Are you at risk for the Coronavirus (COVID-19)?  To be considered HIGH RISK for Coronavirus (COVID-19), you have to meet the following criteria:  . Traveled to [place], [place], [place], [place] or [place]; or in the [place] to [place], [place], [place], or [place]; and have fever, cough, and shortness of breath within the last 2 weeks of travel OR . Been in close contact with a person diagnosed with COVID-19 within the last 2 weeks and have fever, cough, and shortness of breath . IF YOU DO NOT MEET THESE CRITERIA, YOU ARE CONSIDERED LOW RISK FOR COVID-19.  What to do if you are HIGH RISK for COVID-19?  Marland Kitchen If you are having a medical emergency, call 911. . Seek medical care right away. Before you go to a doctor's office, urgent care or emergency department, call ahead and tell them about your recent travel, contact with someone diagnosed with  COVID-19, and your symptoms. You should receive instructions from your physician's office regarding next steps of care.  . When you arrive at healthcare provider, tell the healthcare staff immediately you have returned from visiting [place], [place], [place], [place] or [place]; or traveled in the [place] to [place], [place], [place], or [place]; in the last two weeks or you have been in close contact with a person diagnosed with COVID-19 in the last 2 weeks.   . Tell the health care staff about your symptoms: fever, cough and shortness of breath. . After you have been seen by a medical provider, you will be either: o Tested for (COVID-19) and discharged home on quarantine except to seek medical care if symptoms worsen, and asked to  - Stay home and avoid contact with others until you get your results (4-5 days)  - Avoid travel on public transportation if possible (such as bus, train, or airplane) or o Sent to the Emergency Department by EMS for evaluation, COVID-19 testing, and possible admission depending on your condition and test results.  What to do if you are LOW RISK for COVID-19?  Reduce your risk of any infection by using the same precautions used for avoiding the common cold or flu:  Marland Kitchen Wash your hands often with soap and warm water for at least 20 seconds.  If soap  and water are not readily available, use an alcohol-based hand sanitizer with at least 60% alcohol.  . If coughing or sneezing, cover your mouth and nose by coughing or sneezing into the elbow areas of your shirt or coat, into a tissue or into your sleeve (not your hands). . Avoid shaking hands with others and consider head nods or verbal greetings only. . Avoid touching your eyes, nose, or mouth with unwashed hands.  . Avoid close contact with people who are sick. . Avoid places or events with large numbers of people in one location, like concerts or sporting events. . Carefully consider travel plans you have or  are making. . If you are planning any travel outside or inside the [place], visit the CDC's Travelers' Health webpage for the latest health notices. . If you have some symptoms but not all symptoms, continue to monitor at home and seek medical attention if your symptoms worsen. . If you are having a medical emergency, call 911.   Ila / e-Visit: [email or web address]         [facility]: [facility]: [phone]                   [facility]: [phone]           It is flu season:   >>> Best ways to protect herself from the flu: Receive the yearly flu vaccine, practice good hand hygiene washing with soap and also using hand sanitizer when available, eat a nutritious meals, get adequate rest, hydrate appropriately   Please contact the office if your symptoms worsen or you have concerns that you are not improving.   Thank you for choosing [facility] for your healthcare, and for allowing [place] to partner with you on your healthcare journey. I am thankful to be able to provide care to you today.   Wyn Quaker FNP-C

## 2019-03-12 NOTE — Assessment & Plan Note (Signed)
Plan: We recommend that you stop using smokeless tobacco With patient smokeless tobacco use if he continues to have nasal bleeds may need to consider imaging

## 2019-03-12 NOTE — Assessment & Plan Note (Signed)
Clear breath sounds on exam today Increased postnasal drip, recent hemoptysis  Plan: We will treat sinobronchitis flare with doxycycline and prednisone Cough suppression: Tessalon and Delsym during the day for cough management Tussionex cough medicine at night Nasal saline rinses 1-2 times daily Limit Nasonex use

## 2019-03-12 NOTE — Assessment & Plan Note (Signed)
Likely related to Nasonex use and epistaxis from sinus infection  Difficult to fully visualize CTA chest based off of radiology notes.  Examination is limited by breath and motion.  Plan: D-dimer and CBC today Limit Nasonex use Will treat as sinus infection today Doxycycline Short course of prednisone Cough suppression-Tessalon Perles and Delsym during the day, Tussionex at night Nasal saline rinses 1-2 times daily

## 2019-03-12 NOTE — Progress Notes (Signed)
Spoke with pt and notified of results per Dr. Wyn Quaker, FNP Pt verbalized understanding and denied any questions.

## 2019-03-12 NOTE — Progress Notes (Signed)
Lab work is stable.  Slight dip in hemoglobin but still within normal range.  We will continue to monitor.  D-dimer which is the blood test to monitor to see if there is a clot is negative.  This is good news.  Continue with planned work-up outlined at office visit today.  If you have additional questions or concerns or worsening cough or coughing up blood please contact our office.  Wyn Quaker, FNP

## 2019-03-13 ENCOUNTER — Ambulatory Visit: Payer: 59 | Admitting: Pulmonary Disease

## 2019-03-19 ENCOUNTER — Telehealth: Payer: Self-pay | Admitting: Pulmonary Disease

## 2019-03-19 NOTE — Telephone Encounter (Signed)
Spoke with the pt  He states he just finished pred and doxy today He coughed throughout the night and woke up coughing up tan sputum with dark red to black specks  He denies any nose bleeds  He has been laying off the nasonex and just using saline NS and saline nasal gel  He states he just used his last tessalon pearl capsule  Has not tried the mucinex or delsym that Paschal had rec  He uses tussionex sparingly at bedtime only  He wants to know if he can take the prilosec that Lelon rec along with his nexium that he already takes at bedtime He states has minimal wheezing, but no SOB, chest pain, f/c/s, n/v/d, muscle aches, joint pain, weakness, HA  He is currently out of town in [place]  Please advise thanks

## 2019-03-23 NOTE — Telephone Encounter (Signed)
Patient should follow the recommendations that we outlined.    Why is he not using Mucinex or Delsym? Continue Tussionex use at bedtime only  Patient cannot use Prilosec and Nexium.  Wyn Quaker, FNP

## 2019-03-23 NOTE — Telephone Encounter (Signed)
Noted thank you! Jejuan

## 2019-03-23 NOTE — Telephone Encounter (Signed)
Spoke with the pt and notified of recs per [name] He states that he has been using the mucinex and this has helped the cough  No more bloody sputum  He has made diet modifications and feels that this has helped with his reflux so therefore less cough  Will continue recs and call if needed

## 2019-03-31 DIAGNOSIS — J209 Acute bronchitis, unspecified: Secondary | ICD-10-CM | POA: Diagnosis not present

## 2019-03-31 DIAGNOSIS — I10 Essential (primary) hypertension: Secondary | ICD-10-CM | POA: Diagnosis not present

## 2019-03-31 DIAGNOSIS — K219 Gastro-esophageal reflux disease without esophagitis: Secondary | ICD-10-CM | POA: Diagnosis not present

## 2019-04-02 MED FILL — ESOMEPRAZOLE MAG DR 40 MG C: 40 | 90 days supply | Qty: 90 | Fill #1

## 2019-04-02 MED FILL — LOSARTAN-HCTZ 100-25 MG TAB: 100-25 | 30 days supply | Qty: 15 | Fill #6

## 2019-05-04 ENCOUNTER — Other Ambulatory Visit: Payer: Self-pay

## 2019-05-04 ENCOUNTER — Ambulatory Visit: Payer: Self-pay | Admitting: *Deleted

## 2019-05-04 DIAGNOSIS — Z20822 Contact with and (suspected) exposure to covid-19: Secondary | ICD-10-CM

## 2019-05-04 NOTE — Telephone Encounter (Signed)
Summary: cough, fever, body aches    Patient calling community line requesting call back from NT to discuss potential covid-19 symptoms. Patient states that he has had a temperature of 101 with cough and body aches. Patient is going to [facility] test site today but would like to know what OTC medications he is able to take to help alleviate symptoms. Please advise.      Spoke with patient- patient has had exposure at work- advised patient to test and contact PCP for support. Advised treat symptoms OTC and contact PCP if feels he needs something more(Rx)- also advised patient if breathing problem/dehydration symptoms- go to ED. Reviewed isolation, safe precautions in the home and CDC criteria for ending isolation. Patient is aware if he test positive he will be contacted again and may be contacted by health dept also. Reason for Disposition . HIGH RISK patient (e.g., age > 46 years, diabetes, heart or lung disease, weak immune system) (Exception: Has already been evaluated by healthcare provider and has no new or worsening symptoms)  Answer Assessment - Initial Assessment Questions 1. COVID-19 DIAGNOSIS: "Who made your Coronavirus (COVID-19) diagnosis?" "Was it confirmed by a positive lab test?" If not diagnosed by a HCP, ask "Are there lots of cases (community spread) where you live?" (See public health department website, if unsure)     Community/exposure 2. ONSET: "When did the COVID-19 symptoms start?"      Saturday evening 3. WORST SYMPTOM: "What is your worst symptom?" (e.g., cough, fever, shortness of breath, muscle aches)     Body aches 4. COUGH: "Do you have a cough?" If so, ask: "How bad is the cough?"       Nagging cough  5. FEVER: "Do you have a fever?" If so, ask: "What is your temperature, how was it measured, and when did it start?"     99.0- fever yesterday 101 6. RESPIRATORY STATUS: "Describe your breathing?" (e.g., shortness of breath, wheezing, unable to speak)      No-  patient has asthma 7. BETTER-SAME-WORSE: "Are you getting better, staying the same or getting worse compared to yesterday?"  If getting worse, ask, "In what way?"     Worse- weaker- slower 8. HIGH RISK DISEASE: "Do you have any chronic medical problems?" (e.g., asthma, heart or lung disease, weak immune system, etc.)     Asthma, high blood perssure 9. PREGNANCY: "Is there any chance you are pregnant?" "When was your last menstrual period?"     n/a 10. OTHER SYMPTOMS: "Do you have any other symptoms?"  (e.g., chills, fatigue, headache, loss of smell or taste, muscle pain, sore throat)       Chills, slight sore throat  Protocols used: CORONAVIRUS (COVID-19) DIAGNOSED OR SUSPECTED-A-AH

## 2019-05-05 LAB — NOVEL CORONAVIRUS, NAA: SARS-CoV-2, NAA: DETECTED — AB

## 2019-05-07 ENCOUNTER — Other Ambulatory Visit: Payer: Self-pay

## 2019-05-07 ENCOUNTER — Emergency Department (HOSPITAL_COMMUNITY): Payer: 59

## 2019-05-07 ENCOUNTER — Encounter (HOSPITAL_COMMUNITY): Payer: Self-pay | Admitting: Emergency Medicine

## 2019-05-07 ENCOUNTER — Inpatient Hospital Stay (HOSPITAL_COMMUNITY)
Admission: EM | Admit: 2019-05-07 | Discharge: 2019-05-12 | DRG: 177 | Disposition: A | Discharge status: Home / Self Care | Payer: 59 | Attending: Internal Medicine | Admitting: Internal Medicine

## 2019-05-07 DIAGNOSIS — J9601 Acute respiratory failure with hypoxia: Secondary | ICD-10-CM | POA: Diagnosis present

## 2019-05-07 DIAGNOSIS — E871 Hypo-osmolality and hyponatremia: Secondary | ICD-10-CM | POA: Diagnosis present

## 2019-05-07 DIAGNOSIS — U071 COVID-19: Secondary | ICD-10-CM

## 2019-05-07 DIAGNOSIS — Z79899 Other long term (current) drug therapy: Secondary | ICD-10-CM | POA: Diagnosis not present

## 2019-05-07 DIAGNOSIS — H409 Unspecified glaucoma: Secondary | ICD-10-CM | POA: Diagnosis present

## 2019-05-07 DIAGNOSIS — R945 Abnormal results of liver function studies: Secondary | ICD-10-CM | POA: Diagnosis present

## 2019-05-07 DIAGNOSIS — Z23 Encounter for immunization: Secondary | ICD-10-CM

## 2019-05-07 DIAGNOSIS — Z79891 Long term (current) use of opiate analgesic: Secondary | ICD-10-CM | POA: Diagnosis not present

## 2019-05-07 DIAGNOSIS — Z7952 Long term (current) use of systemic steroids: Secondary | ICD-10-CM

## 2019-05-07 DIAGNOSIS — R7989 Other specified abnormal findings of blood chemistry: Secondary | ICD-10-CM

## 2019-05-07 DIAGNOSIS — F17290 Nicotine dependence, other tobacco product, uncomplicated: Secondary | ICD-10-CM | POA: Diagnosis present

## 2019-05-07 DIAGNOSIS — Z87442 Personal history of urinary calculi: Secondary | ICD-10-CM | POA: Diagnosis not present

## 2019-05-07 DIAGNOSIS — I10 Essential (primary) hypertension: Secondary | ICD-10-CM | POA: Diagnosis not present

## 2019-05-07 DIAGNOSIS — R740 Nonspecific elevation of levels of transaminase and lactic acid dehydrogenase [LDH]: Secondary | ICD-10-CM | POA: Diagnosis present

## 2019-05-07 DIAGNOSIS — Z8249 Family history of ischemic heart disease and other diseases of the circulatory system: Secondary | ICD-10-CM | POA: Diagnosis not present

## 2019-05-07 DIAGNOSIS — Z800 Family history of malignant neoplasm of digestive organs: Secondary | ICD-10-CM | POA: Diagnosis not present

## 2019-05-07 DIAGNOSIS — Z6838 Body mass index (BMI) 38.0-38.9, adult: Secondary | ICD-10-CM

## 2019-05-07 DIAGNOSIS — R778 Other specified abnormalities of plasma proteins: Secondary | ICD-10-CM

## 2019-05-07 DIAGNOSIS — E662 Morbid (severe) obesity with alveolar hypoventilation: Secondary | ICD-10-CM | POA: Diagnosis present

## 2019-05-07 DIAGNOSIS — I248 Other forms of acute ischemic heart disease: Secondary | ICD-10-CM | POA: Diagnosis present

## 2019-05-07 DIAGNOSIS — J45901 Unspecified asthma with (acute) exacerbation: Secondary | ICD-10-CM | POA: Diagnosis present

## 2019-05-07 DIAGNOSIS — K760 Fatty (change of) liver, not elsewhere classified: Secondary | ICD-10-CM | POA: Diagnosis present

## 2019-05-07 DIAGNOSIS — J1289 Other viral pneumonia: Secondary | ICD-10-CM | POA: Diagnosis present

## 2019-05-07 DIAGNOSIS — R0602 Shortness of breath: Secondary | ICD-10-CM

## 2019-05-07 HISTORY — DX: Other specified abnormal findings of blood chemistry: R79.89

## 2019-05-07 HISTORY — DX: Other specified abnormalities of plasma proteins: R77.8

## 2019-05-07 HISTORY — DX: Abnormal results of liver function studies: R94.5

## 2019-05-07 HISTORY — DX: COVID-19: U07.1

## 2019-05-07 LAB — CBC WITH DIFFERENTIAL/PLATELET
Abs Immature Granulocytes: 0.01 10*3/uL (ref 0.00–0.07)
Basophils Absolute: 0 10*3/uL (ref 0.0–0.1)
Basophils Relative: 0 %
Eosinophils Absolute: 0 10*3/uL (ref 0.0–0.5)
Eosinophils Relative: 0 %
HCT: 47.5 % (ref 39.0–52.0)
Hemoglobin: 15.9 g/dL (ref 13.0–17.0)
Immature Granulocytes: 0 %
Lymphocytes Relative: 16 %
Lymphs Abs: 0.7 10*3/uL (ref 0.7–4.0)
MCH: 30.2 pg (ref 26.0–34.0)
MCHC: 33.5 g/dL (ref 30.0–36.0)
MCV: 90.3 fL (ref 80.0–100.0)
Monocytes Absolute: 0.3 10*3/uL (ref 0.1–1.0)
Monocytes Relative: 7 %
Neutro Abs: 3.5 10*3/uL (ref 1.7–7.7)
Neutrophils Relative %: 77 %
Platelets: 127 10*3/uL — ABNORMAL LOW (ref 150–400)
RBC: 5.26 MIL/uL (ref 4.22–5.81)
RDW: 13.3 % (ref 11.5–15.5)
WBC: 4.6 10*3/uL (ref 4.0–10.5)
nRBC: 0 % (ref 0.0–0.2)

## 2019-05-07 LAB — COMPREHENSIVE METABOLIC PANEL
ALT: 102 U/L — ABNORMAL HIGH (ref 0–44)
AST: 107 U/L — ABNORMAL HIGH (ref 15–41)
Albumin: 4.5 g/dL (ref 3.5–5.0)
Alkaline Phosphatase: 56 U/L (ref 38–126)
Anion gap: 13 (ref 5–15)
BUN: 16 mg/dL (ref 6–20)
CO2: 24 mmol/L (ref 22–32)
Calcium: 8.9 mg/dL (ref 8.9–10.3)
Chloride: 95 mmol/L — ABNORMAL LOW (ref 98–111)
Creatinine, Ser: 1.12 mg/dL (ref 0.61–1.24)
GFR calc Af Amer: >60 mL/min (ref >60)
GFR calc non Af Amer: >60 mL/min (ref >60)
Glucose, Bld: 109 mg/dL — ABNORMAL HIGH (ref 70–99)
Potassium: 3.4 mmol/L — ABNORMAL LOW (ref 3.5–5.1)
Sodium: 132 mmol/L — ABNORMAL LOW (ref 135–145)
Total Bilirubin: 0.5 mg/dL (ref 0.3–1.2)
Total Protein: 7.9 g/dL (ref 6.5–8.1)

## 2019-05-07 LAB — C-REACTIVE PROTEIN: CRP: 3.3 mg/dL — ABNORMAL HIGH (ref <1.0)

## 2019-05-07 LAB — BRAIN NATRIURETIC PEPTIDE: B Natriuretic Peptide: 14 pg/mL (ref 0.0–100.0)

## 2019-05-07 LAB — TROPONIN I (HIGH SENSITIVITY)
Troponin I (High Sensitivity): 22 ng/L — ABNORMAL HIGH (ref <18)
Troponin I (High Sensitivity): 23 ng/L — ABNORMAL HIGH (ref <18)

## 2019-05-07 LAB — PROCALCITONIN: Procalcitonin: <0.1 ng/mL

## 2019-05-07 LAB — LACTATE DEHYDROGENASE: LDH: 257 U/L — ABNORMAL HIGH (ref 98–192)

## 2019-05-07 LAB — FERRITIN: Ferritin: 502 ng/mL — ABNORMAL HIGH (ref 24–336)

## 2019-05-07 LAB — D-DIMER, QUANTITATIVE: D-Dimer, Quant: 0.8 ug/mL-FEU — ABNORMAL HIGH (ref 0.00–0.50)

## 2019-05-07 MED ORDER — ACETAMINOPHEN 325 MG PO TABS
650.0000 mg | ORAL_TABLET | Freq: Once | ORAL | Status: AC
  Administered 2019-05-07: 650 mg via ORAL
  Filled 2019-05-07: qty 2

## 2019-05-07 MED ORDER — POTASSIUM CHLORIDE IN NACL 20-0.9 MEQ/L-% IV SOLN
INTRAVENOUS | Status: AC
  Administered 2019-05-07: 23:00:00 via INTRAVENOUS
  Filled 2019-05-07: qty 1000

## 2019-05-07 MED ORDER — ENOXAPARIN SODIUM 60 MG/0.6ML ~~LOC~~ SOLN
60.0000 mg | SUBCUTANEOUS | Status: DC
  Administered 2019-05-07 – 2019-05-10 (×4): 60 mg via SUBCUTANEOUS
  Filled 2019-05-07 (×4): qty 0.6

## 2019-05-07 NOTE — ED Triage Notes (Signed)
Started having symptoms of covid Saturday evening.  Fever on Sunday.  Found out he was positive on Tuesday.  Increased shortness of breath and chest tightness started today.

## 2019-05-07 NOTE — ED Notes (Signed)
Ambulated patient in room on room air, saturation between 92-95%, patient continuously cough during ambulation, with dyspnea on exertion, placed on 2 liters for better perfusion.

## 2019-05-07 NOTE — H&P (Addendum)
TRH H&P    Patient Demographics:    [name], is a 53 y.o. male  MRN: [id number]  DOB - 24-Jun-1966  Admit Date - 05/07/2019  Referring MD/NP/PA:   Janeece Fitting  Outpatient Primary MD for the patient is Lavone Orn, MD  Patient coming from:  home  Chief complaint-  Sob, chest pain with cough   HPI:    [name]  is a 53 y.o. male,  w hypertension, Fatty liver (03/10/2019 on CT ), asthma, OSA, covid -19 positive  05/05/2019  apparently presents with c/o dyspnea, cough w green sputum, and chest pain with cough. Pt notes fever on Sunday.  Pt denies palp, n/v, diarrhea, brbpr. Pt notes that he caught covid-19 probably from work. There was an outbreak of covid there.   In ED,    T 100.1 P 82  R 20 Bp 125/73  Pox 93% on RA Wt 117.9kg  Per ED physician 90% on RA, w ambulation.  ED concerned due to dyspnea and fever that patient will not do well at home.   CXR IMPRESSION: Low lung volumes with asymmetric elevation of the right hemidiaphragm and likely atelectasis with no evidence of superimposed acute cardiopulmonary disease.  Wbc 4.6, Hgb 15.9, Plt 127 Na 132, K 3.4, Bun 16, Creatinine 1.12 Trop 22  Ast 107, Alt 102  Pt will be admitted for dyspnea and cough secondary to covid -19 infection  And abnormal liver function.    Review of systems:    In addition to the HPI above,    No Headache, No changes with Vision or hearing, No problems swallowing food or Liquids, No Abdominal pain, No Nausea or Vomiting, bowel movements are regular, No Blood in stool or Urine, No dysuria, No new skin rashes or bruises, No new joints pains-aches,  No new weakness, tingling, numbness in any extremity, No recent weight gain or loss, No polyuria, polydypsia or polyphagia, No significant Mental Stressors.  All other systems reviewed and are negative.    Past History of the following :    Past Medical  History:  Diagnosis Date  . Asthma    Dr. Laurann [place]  . Hypertension   . Kidney stones   . OSA (obstructive sleep apnea)   . Renal disorder       History reviewed. No pertinent surgical history.    Social History:      Social History   Tobacco Use  . Smoking status: Former Smoker    Packs/day: 1.00    Years: 1.00    Pack years: 1.00    Types: Cigarettes    Quit date: 08/14/1983    Years since quitting: 35.7  . Smokeless tobacco: Current User    Types: Chew  . Tobacco comment: dip  Substance Use Topics  . Alcohol use: Yes    Alcohol/week: 0.0 standard drinks    Comment: occ.       Family History :     Family History  Problem Relation Age of Onset  . Hypertension Mother   . Colon  cancer Mother   . Heart disease Father        Home Medications:   Prior to Admission medications   Medication Sig Start Date End Date Taking? Authorizing Provider  albuterol (PROAIR HFA) 108 (90 Base) MCG/ACT inhaler Inhale 2 puffs into the lungs every 6 (six) hours as needed for wheezing or shortness of breath. 05/02/18   Martyn Ehrich, NP  benzonatate (TESSALON PERLES) 100 MG capsule Take by mouth 3 (three) times daily as needed for cough.    [provider]  chlorpheniramine-HYDROcodone (TUSSIONEX PENNKINETIC ER) 10-8 MG/5ML SUER Take 5 mLs by mouth at bedtime as needed for cough. 03/12/19   Lauraine Rinne, NP  doxycycline (VIBRA-TABS) 100 MG tablet Take 1 tablet (100 mg total) by mouth 2 (two) times daily. 03/12/19   Lauraine Rinne, NP  esomeprazole (NEXIUM) 40 MG capsule Take 40 mg by mouth every evening.     [provider]  latanoprost (XALATAN) 0.005 % ophthalmic solution Place 1 drop into both eyes at bedtime.  02/03/19   [provider]  losartan-hydrochlorothiazide (HYZAAR) 100-25 MG tablet Take 0.5 tablets by mouth daily.    [provider]  mometasone (NASONEX) 50 MCG/ACT nasal spray Place 2 sprays into the nose daily.    [provider]  Multiple Vitamin (MULTIVITAMIN) tablet Take 1 tablet by mouth daily.    [provider]  predniSONE (DELTASONE) 10 MG tablet Take 2 tablets (3m total) daily for the next 5 days. Take in the AM with food. 03/12/19   MLauraine Rinne NP     Allergies:     Allergies  Allergen Reactions  . Neosporin [Neomycin-Bacitracin Zn-Polymyx]     unknown  . Penicillins     Did it involve swelling of the face/tongue/throat, SOB, or low BP? Unknown Did it involve sudden or severe rash/hives, skin peeling, or any reaction on the inside of your mouth or nose? Unknown Did you need to seek medical attention at a hospital or doctor's office? Unknown When did it last happen?Childhood  If all above answers are "NO", may proceed with cephalosporin use.  Unknown reaction  . Tramadol Hcl Hives and Itching     Physical Exam:   Vitals  Blood pressure 115/73, pulse 81, temperature 100.1 F (37.8 C), temperature source Oral, resp. rate 20, height _0  (1.753 m), weight 117.9 kg, SpO2 98 %.  1.  General: axox3  2. Psychiatric: euthymic  3. Neurologic: cn2-12 intact, reflexes 2+ symmetric, diffuse with no clonus, motor 5/5 in all 4 ext  4. HEENMT:  Anicteric, pupils 1.574msymmetric, direct, consensual, near intact,  Neck: no jvd  5. Respiratory : CTAB  6. Cardiovascular : rrr s1, s2, no m/g/r  7. Gastrointestinal:  Abd: soft, nt, nd, +bs  8. Skin:  Ext: no c/c/e,  No rash  9.Musculoskeletal:  Good ROM      Data Review:    CBC Recent Labs  Lab 05/07/19 1924  WBC 4.6  HGB 15.9  HCT 47.5  PLT 127*  MCV 90.3  MCH 30.2  MCHC 33.5  RDW 13.3  LYMPHSABS 0.7  MONOABS 0.3  EOSABS 0.0  BASOSABS 0.0   ------------------------------------------------------------------------------------------------------------------  Results for orders placed or performed during the hospital encounter of 05/07/19 (from the past 48 hour(s))  CBC with Differential      Status: Abnormal   Collection Time: 05/07/19  7:24 PM  Result Value Ref Range   WBC 4.6 4.0 - 10.5 K/uL  RBC 5.26 4.22 - 5.81 MIL/uL   Hemoglobin 15.9 13.0 - 17.0 g/dL   HCT 47.5 39.0 - 52.0 %   MCV 90.3 80.0 - 100.0 fL   MCH 30.2 26.0 - 34.0 pg   MCHC 33.5 30.0 - 36.0 g/dL   RDW 13.3 11.5 - 15.5 %   Platelets 127 (L) 150 - 400 K/uL   nRBC 0.0 0.0 - 0.2 %   Neutrophils Relative % 77 %   Neutro Abs 3.5 1.7 - 7.7 K/uL   Lymphocytes Relative 16 %   Lymphs Abs 0.7 0.7 - 4.0 K/uL   Monocytes Relative 7 %   Monocytes Absolute 0.3 0.1 - 1.0 K/uL   Eosinophils Relative 0 %   Eosinophils Absolute 0.0 0.0 - 0.5 K/uL   Basophils Relative 0 %   Basophils Absolute 0.0 0.0 - 0.1 K/uL   Immature Granulocytes 0 %   Abs Immature Granulocytes 0.01 0.00 - 0.07 K/uL    Comment: Performed at [facility], [address] [place], [place] [address]  Comprehensive metabolic panel     Status: Abnormal   Collection Time: 05/07/19  7:24 PM  Result Value Ref Range   Sodium 132 (L) 135 - 145 mmol/L   Potassium 3.4 (L) 3.5 - 5.1 mmol/L   Chloride 95 (L) 98 - 111 mmol/L   CO2 24 22 - 32 mmol/L   Glucose, Bld 109 (H) 70 - 99 mg/dL   BUN 16 6 - 20 mg/dL   Creatinine, Ser 1.12 0.61 - 1.24 mg/dL   Calcium 8.9 8.9 - 10.3 mg/dL   Total Protein 7.9 6.5 - 8.1 g/dL   Albumin 4.5 3.5 - 5.0 g/dL   AST 107 (H) 15 - 41 U/L   ALT 102 (H) 0 - 44 U/L   Alkaline Phosphatase 56 38 - 126 U/L   Total Bilirubin 0.5 0.3 - 1.2 mg/dL   GFR calc non Af Amer >60 >60 mL/min   GFR calc Af Amer >60 >60 mL/min   Anion gap 13 5 - 15    Comment: Performed at [facility], [address], [place], [place] [address]  Troponin I (High Sensitivity)     Status: Abnormal   Collection Time: 05/07/19  7:24 PM  Result Value Ref Range   Troponin I (High Sensitivity) 22 (H) <18 ng/L    Comment: (NOTE) Elevated high sensitivity troponin I (hsTnI) values and significant  changes across serial measurements may suggest ACS but many other   chronic and acute conditions are known to elevate hsTnI results.  Refer to the "Links" section for chest pain algorithms and additional  guidance. Performed at [facility], [address] [place], [place] [address]     Chemistries  Recent Labs  Lab 05/07/19 1924  NA 132*  K 3.4*  CL 95*  CO2 24  GLUCOSE 109*  BUN 16  CREATININE 1.12  CALCIUM 8.9  AST 107*  ALT 102*  ALKPHOS 56  BILITOT 0.5   ------------------------------------------------------------------------------------------------------------------  ------------------------------------------------------------------------------------------------------------------ GFR: Estimated Creatinine Clearance: 97.8 mL/min (by C-G formula based on SCr of 1.12 mg/dL). Liver Function Tests: Recent Labs  Lab 05/07/19 1924  AST 107*  ALT 102*  ALKPHOS 56  BILITOT 0.5  PROT 7.9  ALBUMIN 4.5   No results for input(s): LIPASE, AMYLASE in the last 168 hours. No results for input(s): AMMONIA in the last 168 hours. Coagulation Profile: No results for input(s): INR, PROTIME in the last 168 hours. Cardiac Enzymes: No results for input(s): CKTOTAL,  CKMB, CKMBINDEX, TROPONINI in the last 168 hours. BNP (last 3 results) No results for input(s): PROBNP in the last 8760 hours. HbA1C: No results for input(s): HGBA1C in the last 72 hours. CBG: No results for input(s): GLUCAP in the last 168 hours. Lipid Profile: No results for input(s): CHOL, HDL, LDLCALC, TRIG, CHOLHDL, LDLDIRECT in the last 72 hours. Thyroid Function Tests: No results for input(s): TSH, T4TOTAL, FREET4, T3FREE, THYROIDAB in the last 72 hours. Anemia Panel: No results for input(s): VITAMINB12, FOLATE, FERRITIN, TIBC, IRON, RETICCTPCT in the last 72 hours.  --------------------------------------------------------------------------------------------------------------- Urine analysis:    Component Value Date/Time   COLORURINE YELLOW 10/17/2015 1759    APPEARANCEUR HAZY (A) 10/17/2015 1759   LABSPEC <1.005 (L) 10/17/2015 1759   PHURINE 5.5 10/17/2015 1759   GLUCOSEU NEGATIVE 10/17/2015 1759   HGBUR NEGATIVE 10/17/2015 1759   BILIRUBINUR NEGATIVE 10/17/2015 1759   KETONESUR NEGATIVE 10/17/2015 1759   PROTEINUR NEGATIVE 10/17/2015 1759   UROBILINOGEN 0.2 03/21/2015 1006   NITRITE NEGATIVE 10/17/2015 1759   LEUKOCYTESUR NEGATIVE 10/17/2015 1759      Imaging Results:    Dg Chest Portable 1 View  Result Date: 05/07/2019 CLINICAL DATA:  52 year old male with toe injury and shortness of breath EXAM: PORTABLE CHEST 1 VIEW COMPARISON:  March 10, 2019 FINDINGS: Cardiomediastinal silhouette unchanged in size and contour. Similar appearance of asymmetric elevation the right hemidiaphragm with low lung volumes. Coarsened interstitial markings of the bilateral lungs. No pneumothorax. No pleural effusion. No confluent airspace disease. IMPRESSION: Low lung volumes with asymmetric elevation of the right hemidiaphragm and likely atelectasis with no evidence of superimposed acute cardiopulmonary disease. Electronically Signed   By: Corrie Mckusick D.O.   On: 05/07/2019 20:51    ekg nsr at 70, lad, nl int, q in 2, 3, avf, no st-t changes c/w ischemia   Assessment & Plan:    Principal Problem:   COVID-19 virus infection Active Problems:   Essential hypertension   Abnormal liver function   Elevated troponin  covid -19 infection Check esr, crp, LDH, cpk, IL-6, D dimer, trop Dexamethasone 67m iv qday  Asthma exacerbation  Dexamethasone as above zithromax 5016miv qday Albuterol HFA 2puff qid and q6h prn   Elevated troponin Cycle troponin Check cardiac echo   Abnormal liver function Check acute hepatitis panel Check RUQ ultrasound Check cmp in am  Hypertension Cont Losartan/ hydrochlorothiazide   Glaucoma Cont Xalatan   DVT Prophylaxis-   Lovenox - SCDs   AM Labs Ordered, also please review Full Orders  Family Communication:  Admission, patients condition and plan of care including tests being ordered have been discussed with the patient  who indicate understanding and agree with the plan and Code Status.  Code Status:  FULL CODE per patient  Admission status:   Inpatient: Based on patients clinical presentation and evaluation of above clinical data, I have made determination that patient meets Inpatient criteria at this time.  Pt has dyspnea and fever, and cough and chest pain w elevated troponin, the chest pain is w cough. Pt has high risk of clinical deterioration, from covid -19 infection  due to obeisity and dyspnea/ fever, and hypoxia 90%. And lab abnormalities and underlying lung disease asthma.   Time spent in minutes : 55 minutes   JaJani Gravel.D on 05/07/2019 at 9:51 PM

## 2019-05-07 NOTE — ED Provider Notes (Signed)
[facility] EMERGENCY DEPARTMENT Provider Note   CSN: [id number] Arrival date & time: 05/07/19  1712     History   Chief Complaint Chief Complaint  Patient presents with  . Shortness of Breath    HPI [name] is a 53 y.o. male.     53 y.o male with a PMH of Asthma, HTN, Renal Disorder, OSA presents to the ED with a chief complaint of shortness of breath and chest tightness.  Patient began experiencing Covid19-like symptoms on Saturday, he was tested on Tuesday with a positive result.  She has been treating his symptoms at home with Tylenol, does endorse a T-max of 1-1.8, has been taking Tylenol around the clock "like popcorn ", he reports he cannot break his fever.  He endorses myalgias throughout his whole body, states he feels like he has been beaten down.  He also endorses a productive cough, this lasted about a day with some thick green sputum, since it has binged to a dry cough.  He also endorses some rhinorrhea, reports there is yellow discharge that is been coming down from his nose.  He does have a previous history of asthma, only had a rescue inhaler which he has not been using.  Reports the only thing he has been using for his symptoms is Tylenol.  He describes his shortness of breath as intermittent, does get worse when he coughs which makes his chest feel very tight.  Does not have a prior history of CAD, pulmonary embolisms, no abdominal pain or weakness.       The history is provided by the patient and medical records.    Past Medical History:  Diagnosis Date  . Asthma    Dr. Laurann Montana  . Hypertension   . Kidney stones   . OSA (obstructive sleep apnea)   . Renal disorder     Patient Active Problem List   Diagnosis Date Noted  . Hemoptysis 03/12/2019  . Smokeless tobacco use 08/15/2018  . Morbid obesity due to excess calories ([place]) 07/06/2016  . Essential hypertension 07/06/2016  . Rash 08/02/2014  . Sinobronchitis 07/26/2014  . Cough variant asthma vs  recurrent upper airway cough syndrome 10/16/2013    History reviewed. No pertinent surgical history.      Home Medications    Prior to Admission medications   Medication Sig Start Date End Date Taking? Authorizing Provider  albuterol (PROAIR HFA) 108 (90 Base) MCG/ACT inhaler Inhale 2 puffs into the lungs every 6 (six) hours as needed for wheezing or shortness of breath. 05/02/18   Martyn Ehrich, NP  benzonatate (TESSALON PERLES) 100 MG capsule Take by mouth 3 (three) times daily as needed for cough.    [provider]  chlorpheniramine-HYDROcodone (TUSSIONEX PENNKINETIC ER) 10-8 MG/5ML SUER Take 5 mLs by mouth at bedtime as needed for cough. 03/12/19   Lauraine Rinne, NP  doxycycline (VIBRA-TABS) 100 MG tablet Take 1 tablet (100 mg total) by mouth 2 (two) times daily. 03/12/19   Lauraine Rinne, NP  esomeprazole (NEXIUM) 40 MG capsule Take 40 mg by mouth every evening.     [provider]  latanoprost (XALATAN) 0.005 % ophthalmic solution Place 1 drop into both eyes at bedtime.  02/03/19   [provider]  losartan-hydrochlorothiazide (HYZAAR) 100-25 MG tablet Take 0.5 tablets by mouth daily.    [provider]  mometasone (NASONEX) 50 MCG/ACT nasal spray Place 2 sprays into the nose daily.    [provider]  Multiple Vitamin (MULTIVITAMIN) tablet Take 1 tablet by mouth daily.    [provider]  predniSONE (DELTASONE) 10 MG tablet Take 2 tablets (20mg  total) daily for the next 5 days. Take in the AM with food. 03/12/19   Lauraine Rinne, NP    Family History Family History  Problem Relation Age of Onset  . Hypertension Mother   . Colon cancer Mother   . Heart disease Father     Social History Social History   Tobacco Use  . Smoking status: Former Smoker    Packs/day: 1.00    Years: 1.00    Pack years: 1.00    Types: Cigarettes    Quit date: 08/14/1983    Years since quitting: 35.7  . Smokeless tobacco: Current User     Types: Chew  . Tobacco comment: dip  Substance Use Topics  . Alcohol use: Yes    Alcohol/week: 0.0 standard drinks    Comment: occ.  . Drug use: No     Allergies   Neosporin [neomycin-bacitracin zn-polymyx], Penicillins, and Tramadol hcl   Review of Systems Review of Systems  Constitutional: Positive for fever. Negative for chills.  HENT: Positive for rhinorrhea. Negative for ear pain and sore throat.   Eyes: Negative for pain and visual disturbance.  Respiratory: Positive for cough, chest tightness and shortness of breath.   Cardiovascular: Negative for chest pain, palpitations and leg swelling.  Gastrointestinal: Positive for diarrhea. Negative for abdominal pain, nausea and vomiting.  Genitourinary: Negative for dysuria and hematuria.  Musculoskeletal: Positive for myalgias. Negative for arthralgias and back pain.  Skin: Negative for color change and rash.  Neurological: Negative for seizures and syncope.  All other systems reviewed and are negative.    Physical Exam Updated Vital Signs BP 125/73 (BP Location: Right Arm)   Pulse 82   Temp 100.1 F (37.8 C) (Oral)   Ht 5\' 9"  (1.753 m)   Wt 117.9 kg   SpO2 93%   BMI 38.40 kg/m   Physical Exam Vitals signs and nursing note reviewed.  Constitutional:      Appearance: He is well-developed. He is ill-appearing.  HENT:     Head: Normocephalic and atraumatic.  Eyes:     General: No scleral icterus.    Pupils: Pupils are equal, round, and reactive to light.  Neck:     Musculoskeletal: Normal range of motion.  Cardiovascular:     Rate and Rhythm: Normal rate.     Heart sounds: Normal heart sounds.  Pulmonary:     Effort: Pulmonary effort is normal.     Breath sounds: Examination of the right-lower field reveals rales. Examination of the left-lower field reveals rales. Decreased breath sounds, wheezing and rales present.  Chest:     Chest wall: No tenderness.  Abdominal:     General: Bowel sounds are normal.  There is no distension.     Palpations: Abdomen is soft.     Tenderness: There is no abdominal tenderness.  Musculoskeletal:        General: No tenderness or deformity.  Skin:    General: Skin is warm and dry.  Neurological:     Mental Status: He is alert and oriented to person, place, and time.      ED Treatments / Results  Labs (all labs ordered are listed, but only abnormal results are displayed) Labs Reviewed  CBC WITH DIFFERENTIAL/PLATELET - Abnormal; Notable for the following components:      Result Value   Platelets  127 (*)    All other components within normal limits  COMPREHENSIVE METABOLIC PANEL - Abnormal; Notable for the following components:   Sodium 132 (*)    Potassium 3.4 (*)    Chloride 95 (*)    Glucose, Bld 109 (*)    AST 107 (*)    ALT 102 (*)    All other components within normal limits  TROPONIN I (HIGH SENSITIVITY) - Abnormal; Notable for the following components:   Troponin I (High Sensitivity) 22 (*)    All other components within normal limits  TROPONIN I (HIGH SENSITIVITY)    EKG None  Radiology Dg Chest Portable 1 View  Result Date: 05/07/2019 CLINICAL DATA:  53 year old male with toe injury and shortness of breath EXAM: PORTABLE CHEST 1 VIEW COMPARISON:  March 10, 2019 FINDINGS: Cardiomediastinal silhouette unchanged in size and contour. Similar appearance of asymmetric elevation the right hemidiaphragm with low lung volumes. Coarsened interstitial markings of the bilateral lungs. No pneumothorax. No pleural effusion. No confluent airspace disease. IMPRESSION: Low lung volumes with asymmetric elevation of the right hemidiaphragm and likely atelectasis with no evidence of superimposed acute cardiopulmonary disease. Electronically Signed   By: Corrie Mckusick D.O.   On: 05/07/2019 20:51    Procedures Procedures (including critical care time)  Medications Ordered in ED Medications  acetaminophen (TYLENOL) tablet 650 mg (650 mg Oral Given 05/07/19  1959)     Initial Impression / Assessment and Plan / ED Course  I have reviewed the triage vital signs and the nursing notes.  Pertinent labs & imaging results that were available during my care of the patient were reviewed by me and considered in my medical decision making (see chart for details).       Patient with a past medical history of asthma, hypertension presents to the ED status post diagnosed with COVID 2 days ago, he has been having symptoms for the past 3 days.  Reports he has increased shortness of breath, this is intermittent and likely worsening with walking.  He also endorses a productive cough with some thick yellow sputum.  He arrived in the ED febrile, 100.1, believe he was likely tachypneic with an oxygen saturation at 93%.  During my whole evaluation patient continues to have coughing fits, oxygen saturation remains along 91% while sitting down during our conversation.  No swelling noted to bilateral extremities, no prior history of heart failure.  Patient was ambulated in his room satting at 90% on room air, was placed on 2 L of oxygen bumping him up to 99%.  CBC showed no leukocytosis, hemoglobin. CMP with some mild electrolyte derangement such as hypokalemia, hyponatremia, LFTs are elevated suspect this likely to COVID infection.  Troponin was 22.  He does not complain of chest pain does complain of chest tightness upon coughing.  X-ray showed: Low lung volumes with asymmetric elevation of the right  hemidiaphragm and likely atelectasis with no evidence of  superimposed acute cardiopulmonary disease.    EKG without changes consistent with infarct or ischemia.   9:38 PM spoke to Dr. Jani Gravel hospitalist who will admit patient onto the [facility] service for further management of his COVID-19 infection.   Portions of this note were generated with [employer]. Dictation errors may occur despite best attempts at proofreading.  Final Clinical  Impressions(s) / ED Diagnoses   Final diagnoses:  Shortness of breath  COVID-19 virus infection    ED Discharge Orders    None  Janeece Fitting, PA-C 05/07/19 2139    Francine Graven, DO 05/12/19 1114

## 2019-05-08 ENCOUNTER — Other Ambulatory Visit (HOSPITAL_COMMUNITY): Payer: 59

## 2019-05-08 ENCOUNTER — Inpatient Hospital Stay (HOSPITAL_COMMUNITY): Payer: 59

## 2019-05-08 DIAGNOSIS — R0602 Shortness of breath: Secondary | ICD-10-CM

## 2019-05-08 LAB — COMPREHENSIVE METABOLIC PANEL
ALT: 88 U/L — ABNORMAL HIGH (ref 0–44)
AST: 97 U/L — ABNORMAL HIGH (ref 15–41)
Albumin: 4.1 g/dL (ref 3.5–5.0)
Alkaline Phosphatase: 49 U/L (ref 38–126)
Anion gap: 13 (ref 5–15)
BUN: 16 mg/dL (ref 6–20)
CO2: 20 mmol/L — ABNORMAL LOW (ref 22–32)
Calcium: 8.1 mg/dL — ABNORMAL LOW (ref 8.9–10.3)
Chloride: 97 mmol/L — ABNORMAL LOW (ref 98–111)
Creatinine, Ser: 0.99 mg/dL (ref 0.61–1.24)
GFR calc Af Amer: >60 mL/min (ref >60)
GFR calc non Af Amer: >60 mL/min (ref >60)
Glucose, Bld: 109 mg/dL — ABNORMAL HIGH (ref 70–99)
Potassium: 3.5 mmol/L (ref 3.5–5.1)
Sodium: 130 mmol/L — ABNORMAL LOW (ref 135–145)
Total Bilirubin: 0.7 mg/dL (ref 0.3–1.2)
Total Protein: 7.1 g/dL (ref 6.5–8.1)

## 2019-05-08 LAB — CBC WITH DIFFERENTIAL/PLATELET
Abs Immature Granulocytes: 0.01 10*3/uL (ref 0.00–0.07)
Basophils Absolute: 0 10*3/uL (ref 0.0–0.1)
Basophils Relative: 0 %
Eosinophils Absolute: 0 10*3/uL (ref 0.0–0.5)
Eosinophils Relative: 0 %
HCT: 42.1 % (ref 39.0–52.0)
Hemoglobin: 14.6 g/dL (ref 13.0–17.0)
Immature Granulocytes: 0 %
Lymphocytes Relative: 31 %
Lymphs Abs: 1.1 10*3/uL (ref 0.7–4.0)
MCH: 30.7 pg (ref 26.0–34.0)
MCHC: 34.7 g/dL (ref 30.0–36.0)
MCV: 88.4 fL (ref 80.0–100.0)
Monocytes Absolute: 0.3 10*3/uL (ref 0.1–1.0)
Monocytes Relative: 8 %
Neutro Abs: 2.1 10*3/uL (ref 1.7–7.7)
Neutrophils Relative %: 61 %
Platelets: 114 10*3/uL — ABNORMAL LOW (ref 150–400)
RBC: 4.76 MIL/uL (ref 4.22–5.81)
RDW: 13.1 % (ref 11.5–15.5)
WBC: 3.4 10*3/uL — ABNORMAL LOW (ref 4.0–10.5)
nRBC: 0 % (ref 0.0–0.2)

## 2019-05-08 LAB — TYPE AND SCREEN
ABO/RH(D): B POS
Antibody Screen: NEGATIVE

## 2019-05-08 LAB — TROPONIN I (HIGH SENSITIVITY): Troponin I (High Sensitivity): 30 ng/L — ABNORMAL HIGH (ref <18)

## 2019-05-08 LAB — ECHOCARDIOGRAM COMPLETE
Height: 69 in
Weight: 4160 oz

## 2019-05-08 LAB — CK TOTAL AND CKMB (NOT AT ARMC)
CK, MB: 2.2 ng/mL (ref 0.5–5.0)
Relative Index: 0.4 (ref 0.0–2.5)
Total CK: 548 U/L — ABNORMAL HIGH (ref 49–397)

## 2019-05-08 MED ORDER — DEXAMETHASONE SODIUM PHOSPHATE 10 MG/ML IJ SOLN
6.0000 mg | INTRAMUSCULAR | Status: DC
  Administered 2019-05-08: 6 mg via INTRAVENOUS
  Filled 2019-05-08: qty 1

## 2019-05-08 MED ORDER — IOHEXOL 350 MG/ML SOLN
100.0000 mL | Freq: Once | INTRAVENOUS | Status: AC | PRN
  Administered 2019-05-08: 100 mL via INTRAVENOUS

## 2019-05-08 MED ORDER — ALBUTEROL SULFATE HFA 108 (90 BASE) MCG/ACT IN AERS
2.0000 | INHALATION_SPRAY | Freq: Four times a day (QID) | RESPIRATORY_TRACT | Status: DC | PRN
  Administered 2019-05-08 – 2019-05-12 (×2): 2 via RESPIRATORY_TRACT
  Filled 2019-05-08: qty 6.7

## 2019-05-08 MED ORDER — SODIUM CHLORIDE 0.9 % IV SOLN
INTRAVENOUS | Status: AC
  Administered 2019-05-08: 16:00:00 via INTRAVENOUS

## 2019-05-08 MED ORDER — SODIUM CHLORIDE 0.9 % IV SOLN
200.0000 mg | Freq: Once | INTRAVENOUS | Status: AC
  Administered 2019-05-08: 200 mg via INTRAVENOUS
  Filled 2019-05-08: qty 40

## 2019-05-08 MED ORDER — SODIUM CHLORIDE 0.9 % IV SOLN
100.0000 mg | INTRAVENOUS | Status: AC
  Administered 2019-05-09 – 2019-05-12 (×4): 100 mg via INTRAVENOUS
  Filled 2019-05-08 (×5): qty 20

## 2019-05-08 MED ORDER — METHYLPREDNISOLONE SODIUM SUCC 40 MG IJ SOLR
40.0000 mg | Freq: Two times a day (BID) | INTRAMUSCULAR | Status: DC
  Administered 2019-05-08 – 2019-05-12 (×9): 40 mg via INTRAVENOUS
  Filled 2019-05-08 (×8): qty 1

## 2019-05-08 MED ORDER — SALINE SPRAY 0.65 % NA SOLN
1.0000 | NASAL | Status: DC | PRN
  Filled 2019-05-08: qty 44

## 2019-05-08 MED ORDER — ALBUTEROL SULFATE HFA 108 (90 BASE) MCG/ACT IN AERS
2.0000 | INHALATION_SPRAY | Freq: Three times a day (TID) | RESPIRATORY_TRACT | Status: DC
  Administered 2019-05-08 – 2019-05-12 (×14): 2 via RESPIRATORY_TRACT
  Filled 2019-05-08: qty 6.7

## 2019-05-08 MED ORDER — LATANOPROST 0.005 % OP SOLN
1.0000 [drp] | Freq: Every day | OPHTHALMIC | Status: DC
  Administered 2019-05-08 – 2019-05-11 (×4): 1 [drp] via OPHTHALMIC
  Filled 2019-05-08 (×2): qty 2.5

## 2019-05-08 MED ORDER — HYDROCHLOROTHIAZIDE 25 MG PO TABS
25.0000 mg | ORAL_TABLET | Freq: Every day | ORAL | Status: DC
  Administered 2019-05-08: 12.5 mg via ORAL
  Filled 2019-05-08: qty 1

## 2019-05-08 MED ORDER — ALBUTEROL SULFATE (2.5 MG/3ML) 0.083% IN NEBU: 3.0000 mL | INHALATION_SOLUTION | Freq: Three times a day (TID) | RESPIRATORY_TRACT | Status: DC

## 2019-05-08 MED ORDER — LOSARTAN POTASSIUM-HCTZ 100-25 MG PO TABS: 0.5000 | ORAL_TABLET | Freq: Every day | ORAL | Status: DC

## 2019-05-08 MED ORDER — ALBUTEROL SULFATE (2.5 MG/3ML) 0.083% IN NEBU: 3.0000 mL | INHALATION_SOLUTION | Freq: Four times a day (QID) | RESPIRATORY_TRACT | Status: DC

## 2019-05-08 MED ORDER — PANTOPRAZOLE SODIUM 40 MG PO TBEC
40.0000 mg | DELAYED_RELEASE_TABLET | Freq: Every day | ORAL | Status: DC
  Administered 2019-05-08 – 2019-05-12 (×5): 40 mg via ORAL
  Filled 2019-05-08 (×5): qty 1

## 2019-05-08 MED ORDER — ONDANSETRON HCL 4 MG/2ML IJ SOLN
4.0000 mg | Freq: Four times a day (QID) | INTRAMUSCULAR | Status: DC | PRN
  Administered 2019-05-08: 4 mg via INTRAVENOUS
  Filled 2019-05-08: qty 2

## 2019-05-08 MED ORDER — HYDROCHLOROTHIAZIDE 12.5 MG PO CAPS
12.5000 mg | ORAL_CAPSULE | Freq: Every day | ORAL | Status: DC
  Filled 2019-05-08 (×4): qty 1

## 2019-05-08 MED ORDER — ADULT MULTIVITAMIN W/MINERALS CH
1.0000 | ORAL_TABLET | Freq: Every day | ORAL | Status: DC
  Administered 2019-05-08 – 2019-05-12 (×5): 1 via ORAL
  Filled 2019-05-08 (×9): qty 1

## 2019-05-08 MED ORDER — SODIUM CHLORIDE 0.9 % IV SOLN
500.0000 mg | INTRAVENOUS | Status: DC
  Administered 2019-05-08: 05:00:00 500 mg via INTRAVENOUS
  Filled 2019-05-08: qty 500

## 2019-05-08 MED ORDER — LOSARTAN POTASSIUM 25 MG PO TABS
100.0000 mg | ORAL_TABLET | Freq: Every day | ORAL | Status: DC
  Administered 2019-05-08: 50 mg via ORAL
  Filled 2019-05-08: qty 4

## 2019-05-08 MED ORDER — ZOLPIDEM TARTRATE 5 MG PO TABS
10.0000 mg | ORAL_TABLET | Freq: Every evening | ORAL | Status: AC | PRN
  Administered 2019-05-08 (×2): 10 mg via ORAL
  Filled 2019-05-08 (×2): qty 2

## 2019-05-08 MED ORDER — ALBUTEROL SULFATE (2.5 MG/3ML) 0.083% IN NEBU: 3.0000 mL | INHALATION_SOLUTION | Freq: Four times a day (QID) | RESPIRATORY_TRACT | Status: DC | PRN

## 2019-05-08 MED ORDER — BENZONATATE 100 MG PO CAPS
200.0000 mg | ORAL_CAPSULE | Freq: Three times a day (TID) | ORAL | Status: DC | PRN
  Administered 2019-05-08: 200 mg via ORAL
  Filled 2019-05-08: qty 2

## 2019-05-08 MED ORDER — LOSARTAN POTASSIUM 25 MG PO TABS
50.0000 mg | ORAL_TABLET | Freq: Every day | ORAL | Status: DC
  Administered 2019-05-09 – 2019-05-12 (×4): 50 mg via ORAL
  Filled 2019-05-08 (×4): qty 2

## 2019-05-08 NOTE — Progress Notes (Addendum)
PROGRESS NOTE    [name]  [id number] DOB: April 05, 1966 DOA: 05/07/2019 PCP: Lavone Orn, MD   Brief Narrative:  Per HPI: [name]  is a 53 y.o. male,  w hypertension, Fatty liver (03/10/2019 on CT ), asthma, OSA, covid -19 positive  05/05/2019  apparently presents with c/o dyspnea, cough w green sputum, and chest pain with cough. Pt notes fever on Sunday.  Pt denies palp, n/v, diarrhea, brbpr. Pt notes that he caught covid-19 probably from work. There was an outbreak of covid there.   9/25: Patient was admitted with acute hypoxemic respiratory failure secondary to COVID infection.  He has been started on dexamethasone as well as azithromycin for asthma exacerbation.  Right upper quadrant ultrasound performed due to LFTs being elevated and notable for hepatic steatosis.  2D echocardiogram pending and was ordered for troponin elevation.  Assessment & Plan:   Principal Problem:   COVID-19 virus infection Active Problems:   Essential hypertension   Abnormal liver function   Elevated troponin   Acute hypoxemic respiratory failure secondary to COVID infection -Continue on dexamethasone 6 mg IV daily -Multiple labs still pending -Continue symptomatic treatment with breathing treatments using inhaler -Discontinue azithromycin -Remdisvir consultation placed to pharmacy  Elevated troponin likely secondary to demand from above -Troponin trend has remained flat -2D echocardiogram pending  Mild transaminitis -Acute hepatitis panel pending -Right upper quadrant ultrasound with hepatic steatosis -Trend  Hypertension -Continue losartan and hold hydrochlorothiazide  Mild hyponatremia -Possibly related to HCTZ use at home -We will hold for now and monitor labs in a.m. -Maintain on gentle normal saline IV, time-limited -Repeat a.m. labs  Glaucoma -Continue Xalatan   DVT prophylaxis: Lovenox Code Status: Full Family Communication: None at bedside Disposition Plan: Plan  to transfer to [facility] for further evaluation and work-up.  Continue on dexamethasone as prescribed.   Consultants:   None  Procedures:   None  Antimicrobials:  Anti-infectives (From admission, onward)   Start     Dose/Rate Route Frequency Ordered Stop   05/08/19 0302  azithromycin (ZITHROMAX) 500 mg in sodium chloride 0.9 % 250 mL IVPB     50 0 mg 250 mL/hr over 60 Minutes Intravenous Every 24 hours 05/08/19 0243         Subjective: Patient seen and evaluated today with ongoing dyspnea and cough noted.  He states he is feeling slightly better than when he first came in.  Objective: Vitals:   05/08/19 0530 05/08/19 0826 05/08/19 0947 05/08/19 1000  BP: 115/73  119/76 122/80  Pulse: 87  79 76  Resp:   20   Temp:   99 F (37.2 C)   TempSrc:   Oral   SpO2: 93% 96% 94% 94%  Weight:      Height:       No intake or output data in the 24 hours ending 05/08/19 1221 Filed Weights   05/07/19 1730  Weight: 117.9 kg    Examination:  General exam: Appears calm and comfortable, overweight/obese Respiratory system: Clear to auscultation. Respiratory effort normal.  Currently on 2 L nasal cannula. Cardiovascular system: S1 & S2 heard, RRR. No JVD, murmurs, rubs, gallops or clicks. No pedal edema. Gastrointestinal system: Abdomen is nondistended, soft and nontender. No organomegaly or masses felt. Normal bowel sounds heard. Central nervous system: Alert and oriented. No focal neurological deficits. Extremities: Symmetric 5 x 5 power. Skin: No rashes, lesions or ulcers Psychiatry: Judgement and insight appear normal. Mood & affect appropriate.  Data Reviewed: I have personally reviewed following labs and imaging studies  CBC: Recent Labs  Lab 05/07/19 1924 05/08/19 0516  WBC 4.6 3.4*  NEUTROABS 3.5 2.1  HGB 15.9 14.6  HCT 47.5 42.1  MCV 90.3 88.4  PLT 127* [id number]*   Basic Metabolic Panel: Recent Labs  Lab 05/07/19 1924 05/08/19 0516  NA 132* 130*  K 3.4* 3.5  CL  95* 97*  CO2 24 20*  GLUCOSE 109* 109*  BUN 16 16  CREATININE 1.12 0.99  CALCIUM 8.9 8.1*   GFR: Estimated Creatinine Clearance: 110.6 mL/min (by C-G formula based on SCr of 0.99 mg/dL). Liver Function Tests: Recent Labs  Lab 05/07/19 1924 05/08/19 0516  AST 107* 97*  ALT 102* 88*  ALKPHOS 56 49  BILITOT 0.5 0.7  PROT 7.9 7.1  ALBUMIN 4.5 4.1   No results for input(s): LIPASE, AMYLASE in the last 168 hours. No results for input(s): AMMONIA in the last 168 hours. Coagulation Profile: No results for input(s): INR, PROTIME in the last 168 hours. Cardiac Enzymes: Recent Labs  Lab 05/08/19 0516  CKTOTAL 548*  CKMB PENDING   BNP (last 3 results) No results for input(s): PROBNP in the last 8760 hours. HbA1C: No results for input(s): HGBA1C in the last 72 hours. CBG: No results for input(s): GLUCAP in the last 168 hours. Lipid Profile: No results for input(s): CHOL, HDL, LDLCALC, TRIG, CHOLHDL, LDLDIRECT in the last 72 hours. Thyroid Function Tests: No results for input(s): TSH, T4TOTAL, FREET4, T3FREE, THYROIDAB in the last 72 hours. Anemia Panel: Recent Labs    05/07/19 1924  FERRITIN 502*   Sepsis Labs: Recent Labs  Lab 05/07/19 1924  PROCALCITON <0.10    Recent Results (from the past 240 hour(s))  Novel Coronavirus, NAA ([facility])     Status: Abnormal   Collection Time: 05/04/19 12:00 AM   Specimen: Oropharyngeal(OP) collection in vial transport medium   OROPHARYNGEA  TESTING  Result Value Ref Range Status   SARS-CoV-2, NAA Detected (A) Not Detected Final    Comment: This nucleic acid amplification test was developed and its performance characteristics determined by [employer]. Nucleic acid amplification tests include PCR and TMA. This test has not been FDA cleared or approved. This test has been authorized by FDA under an Emergency Use Authorization (EUA). This test is only authorized for the duration of time the declaration that circumstances  exist justifying the authorization of the emergency use of in vitro diagnostic tests for detection of SARS-CoV-2 virus and/or diagnosis of COVID-19 infection under section 564(b)(1) of the Act, 21 U.S.C. [id number]) (1), unless the authorization is terminated or revoked sooner. When diagnostic testing is negative, the possibility of a false negative result should be considered in the context of a patient's recent exposures and the presence of clinical signs and symptoms consistent with COVID-19. An individual without symptoms of COVID-19 and who is not shedding SARS-CoV-2 virus would  expect to have a negative (not detected) result in this assay.          Radiology Studies: Ct Angio Chest Pe W Or Wo Contrast  Result Date: 05/08/2019 CLINICAL DATA:  53 year old male with chest pain and shortness of breath. EXAM: CT ANGIOGRAPHY CHEST WITH CONTRAST TECHNIQUE: Multidetector CT imaging of the chest was performed using the standard protocol during bolus administration of intravenous contrast. Multiplanar CT image reconstructions and MIPs were obtained to evaluate the vascular anatomy. CONTRAST:  165mL OMNIPAQUE IOHEXOL 350 MG/ML SOLN COMPARISON:  Chest CT dated 03/10/2019 FINDINGS:  Cardiovascular: There is mild cardiomegaly. No pericardial effusion. Advanced 3 vessel coronary vascular calcification is noted. There is mild atherosclerotic calcification of the aortic arch. No aneurysm or dissection. Evaluation of the pulmonary arteries is very limited due to respiratory motion artifact and suboptimal opacification and timing of the contrast. No definite large or central pulmonary artery embolus identified. Mediastinum/Nodes: Mildly enlarged left hilar lymph node measures 14 mm. Subcarinal lymph node measures 13 mm in short axis. The esophagus and thyroid gland are grossly unremarkable. No mediastinal fluid collection. Lungs/Pleura: Bilateral upper lobe and to a lesser degree lower lobe streaky and  ground-glass densities noted most prominent involving the right upper lobe and most consistent with pneumonia, possibly atypical agent. Clinical correlation is recommended. No lobar consolidation, pleural effusion, or pneumothorax. The central airways are patent. Upper Abdomen: There is fatty infiltration of the liver. Musculoskeletal: Degenerative changes of the spine. No acute osseous pathology. Review of the MIP images confirms the above findings. IMPRESSION: 1. No CT evidence of central pulmonary artery embolus. Evaluation however is very limited due to respiratory motion artifact. 2. Bilateral airspace densities most consistent with multifocal pneumonia. Clinical correlation and follow-up to resolution recommended. 3. Mild cardiomegaly with advanced 3 vessel coronary vascular calcification. 4. Mildly enlarged left hilar and subcarinal lymph nodes, likely reactive. 5. Fatty liver. Aortic Atherosclerosis (ICD10-I70.0). Electronically Signed   By: Anner Crete M.D.   On: 05/08/2019 03:37   Dg Chest Portable 1 View  Result Date: 05/07/2019 CLINICAL DATA:  53 year old male with toe injury and shortness of breath EXAM: PORTABLE CHEST 1 VIEW COMPARISON:  March 10, 2019 FINDINGS: Cardiomediastinal silhouette unchanged in size and contour. Similar appearance of asymmetric elevation the right hemidiaphragm with low lung volumes. Coarsened interstitial markings of the bilateral lungs. No pneumothorax. No pleural effusion. No confluent airspace disease. IMPRESSION: Low lung volumes with asymmetric elevation of the right hemidiaphragm and likely atelectasis with no evidence of superimposed acute cardiopulmonary disease. Electronically Signed   By: Corrie Mckusick D.O.   On: 05/07/2019 20:51   US Abdomen Limited Ruq  Result Date: 05/08/2019 CLINICAL DATA:  Right upper quadrant pain.  COVID-19 positive EXAM: ULTRASOUND ABDOMEN LIMITED RIGHT UPPER QUADRANT COMPARISON:  None. FINDINGS: Gallbladder: No gallstones or  wall thickening visualized. There is no pericholecystic fluid. No sonographic Murphy sign noted by sonographer. Common bile duct: Diameter: 4 mm. No intrahepatic or extrahepatic biliary duct dilatation. Liver: No focal lesion identified. Liver echogenicity is increased diffusely. Portal vein is patent on color Doppler imaging with normal direction of blood flow towards the liver. Other: None. IMPRESSION: Diffuse increase in liver echogenicity, a finding indicative of hepatic steatosis. While no focal liver lesions are evident on this study, it must be cautioned that the sensitivity of ultrasound for detection of focal liver lesions is diminished significantly in this circumstance. Study otherwise unremarkable. Electronically Signed   By: Lowella Grip III M.D.   On: 05/08/2019 07:59        Scheduled Meds:  albuterol  2 puff Inhalation TID   dexamethasone (DECADRON) injection  6 mg Intravenous Q24H   enoxaparin (LOVENOX) injection  60 mg Subcutaneous Q24H   latanoprost  1 drop Both Eyes QHS   losartan  50 mg Oral Daily   multivitamin with minerals  1 tablet Oral Daily   pantoprazole  40 mg Oral Daily   Continuous Infusions:  azithromycin Stopped (05/08/19 0605)     LOS: 1 day    Time spent: 30 minutes    Karnell Vanderloop  Darleen Crocker, DO [facility] Pager [phone]  If 7PM-7AM, please contact night-coverage [email or web address] Password [facility] 05/08/2019, 12:21 PM

## 2019-05-08 NOTE — Progress Notes (Signed)
Remdesivir - Pharmacy Brief Note   O:  ALT: 88  (elevated, but < 220 cut-off.  ALT was higher at 102 on 05/07/19) CXR: Bilateral airspace densities most consistent with multifocal pneumonia SpO2: 96% on 2L   A/P:  Remdesivir 200 mg once followed by 100 mg daily x 4 days.  Closely monitor ALT   Gretta Arab PharmD, BCPS Clinical pharmacist phone 7am- 5pm: [phone] 05/08/2019 3:07 PM

## 2019-05-08 NOTE — Progress Notes (Signed)
  Echocardiogram 2D Echocardiogram has been performed.  [name] 05/08/2019, 5:48 PM

## 2019-05-08 NOTE — Progress Notes (Signed)
Pt says he spoke to his family. No need to call.

## 2019-05-08 NOTE — Plan of Care (Signed)
  Problem: Education: Goal: Knowledge of risk factors and measures for prevention of condition will improve Outcome: Progressing   Problem: Coping: Goal: Psychosocial and spiritual needs will be supported Outcome: Progressing   Problem: Respiratory: Goal: Will maintain a patent airway Outcome: Progressing Goal: Complications related to the disease process, condition or treatment will be avoided or minimized Outcome: Progressing   

## 2019-05-08 NOTE — ED Notes (Signed)
Pt wanting to rest and not be bothered. Pt given [employer]

## 2019-05-09 DIAGNOSIS — I10 Essential (primary) hypertension: Secondary | ICD-10-CM

## 2019-05-09 LAB — COMPREHENSIVE METABOLIC PANEL
ALT: 78 U/L — ABNORMAL HIGH (ref 0–44)
AST: 97 U/L — ABNORMAL HIGH (ref 15–41)
Albumin: 4.2 g/dL (ref 3.5–5.0)
Alkaline Phosphatase: 45 U/L (ref 38–126)
Anion gap: 12 (ref 5–15)
BUN: 16 mg/dL (ref 6–20)
CO2: 24 mmol/L (ref 22–32)
Calcium: 8.6 mg/dL — ABNORMAL LOW (ref 8.9–10.3)
Chloride: 98 mmol/L (ref 98–111)
Creatinine, Ser: 0.79 mg/dL (ref 0.61–1.24)
GFR calc Af Amer: >60 mL/min (ref >60)
GFR calc non Af Amer: >60 mL/min (ref >60)
Glucose, Bld: 140 mg/dL — ABNORMAL HIGH (ref 70–99)
Potassium: 3.5 mmol/L (ref 3.5–5.1)
Sodium: 134 mmol/L — ABNORMAL LOW (ref 135–145)
Total Bilirubin: 0.6 mg/dL (ref 0.3–1.2)
Total Protein: 7.2 g/dL (ref 6.5–8.1)

## 2019-05-09 LAB — HEPATITIS PANEL, ACUTE
HCV Ab: <0.1 s/co ratio (ref 0.0–0.9)
Hep A IgM: NEGATIVE
Hep B C IgM: NEGATIVE
Hepatitis B Surface Ag: NEGATIVE

## 2019-05-09 LAB — CBC WITH DIFFERENTIAL/PLATELET
Abs Immature Granulocytes: 0.01 10*3/uL (ref 0.00–0.07)
Basophils Absolute: 0 10*3/uL (ref 0.0–0.1)
Basophils Relative: 0 %
Eosinophils Absolute: 0 10*3/uL (ref 0.0–0.5)
Eosinophils Relative: 0 %
HCT: 41.9 % (ref 39.0–52.0)
Hemoglobin: 14.5 g/dL (ref 13.0–17.0)
Immature Granulocytes: 0 %
Lymphocytes Relative: 23 %
Lymphs Abs: 0.5 10*3/uL — ABNORMAL LOW (ref 0.7–4.0)
MCH: 30.4 pg (ref 26.0–34.0)
MCHC: 34.6 g/dL (ref 30.0–36.0)
MCV: 87.8 fL (ref 80.0–100.0)
Monocytes Absolute: 0.2 10*3/uL (ref 0.1–1.0)
Monocytes Relative: 8 %
Neutro Abs: 1.6 10*3/uL — ABNORMAL LOW (ref 1.7–7.7)
Neutrophils Relative %: 69 %
Platelets: 133 10*3/uL — ABNORMAL LOW (ref 150–400)
RBC: 4.77 MIL/uL (ref 4.22–5.81)
RDW: 13 % (ref 11.5–15.5)
WBC: 2.3 10*3/uL — ABNORMAL LOW (ref 4.0–10.5)
nRBC: 0 % (ref 0.0–0.2)

## 2019-05-09 LAB — HIV ANTIBODY (ROUTINE TESTING W REFLEX): HIV Screen 4th Generation wRfx: NONREACTIVE

## 2019-05-09 LAB — FERRITIN: Ferritin: 566 ng/mL — ABNORMAL HIGH (ref 24–336)

## 2019-05-09 LAB — D-DIMER, QUANTITATIVE: D-Dimer, Quant: 0.49 ug/mL-FEU (ref 0.00–0.50)

## 2019-05-09 LAB — C-REACTIVE PROTEIN: CRP: 2.9 mg/dL — ABNORMAL HIGH (ref <1.0)

## 2019-05-09 MED ORDER — ZOLPIDEM TARTRATE 5 MG PO TABS
10.0000 mg | ORAL_TABLET | Freq: Every evening | ORAL | Status: DC | PRN
  Administered 2019-05-09 – 2019-05-10 (×2): 10 mg via ORAL
  Filled 2019-05-09 (×2): qty 2

## 2019-05-09 NOTE — TOC Initial Note (Signed)
Transition of Care [facility]) - Initial/Assessment Note    Patient Details  Name: [name] MRN: [id number] Date of Birth: 12-05-1965  Transition of Care [facility]) CM/SW Contact:    Ninfa Meeker, RN Phone Number: [phone] (working remotely) 05/09/2019, 1:55 PM  Clinical Narrative:  53 yr old male admitted from home with Ronan 19. Case manager will continue to monitor for discharge needs as patient medically improves. May he be blessed to do so.                        Patient Goals and CMS Choice        Expected Discharge Plan and Services                                                Prior Living Arrangements/Services                       Activities of Daily Living Home Assistive Devices/Equipment: None ADL Screening (condition at time of admission) Patient's cognitive ability adequate to safely complete daily activities?: Yes Is the patient deaf or have difficulty hearing?: No Does the patient have difficulty seeing, even when wearing glasses/contacts?: No Does the patient have difficulty concentrating, remembering, or making decisions?: No Patient able to express need for assistance with ADLs?: No Does the patient have difficulty dressing or bathing?: No Independently performs ADLs?: Yes (appropriate for developmental age) Does the patient have difficulty walking or climbing stairs?: No Weakness of Legs: None Weakness of Arms/Hands: None  Permission Sought/Granted                  Emotional Assessment              Admission diagnosis:  Shortness of breath [R06.02] Abnormal liver function [R94.5] COVID-19 virus infection [U07.1] Patient Active Problem List   Diagnosis Date Noted  . COVID-19 virus infection 05/07/2019  . Abnormal liver function 05/07/2019  . Elevated troponin 05/07/2019  . Hemoptysis 03/12/2019  . Smokeless tobacco use 08/15/2018  . Morbid obesity due to excess calories ([place]) 07/06/2016  . Essential  hypertension 07/06/2016  . Rash 08/02/2014  . Sinobronchitis 07/26/2014  . Cough variant asthma vs recurrent upper airway cough syndrome 10/16/2013   PCP:  Lavone Orn, MD Pharmacy:   [id number] - [place], [address] [place] [address] Phone: [phone] Fax: [phone]     Social Determinants of Health (SDOH) Interventions    Readmission Risk Interventions No flowsheet data found.

## 2019-05-09 NOTE — Plan of Care (Signed)
  Problem: Education: Goal: Knowledge of risk factors and measures for prevention of condition will improve Outcome: Progressing   Problem: Coping: Goal: Psychosocial and spiritual needs will be supported Outcome: Progressing   Problem: Respiratory: Goal: Will maintain a patent airway Outcome: Progressing Goal: Complications related to the disease process, condition or treatment will be avoided or minimized Outcome: Progressing   

## 2019-05-09 NOTE — Progress Notes (Signed)
1900 Report received and care assumed. Pt resting in bed with call bell in reach. NADN No c/o Needs met Will continue to monitor

## 2019-05-09 NOTE — Progress Notes (Signed)
PROGRESS NOTE                                                                                                                                                                                                             Patient Demographics:    [name], is a 53 y.o. male, DOB - 07-11-1966, [id number]  Outpatient Primary MD for the patient is Lavone Orn, MD   Admit date - 05/07/2019   LOS - 2  Chief Complaint  Patient presents with   Shortness of Breath       Brief Narrative: Patient is a 53 y.o. male with PMHx of with history of HTN, asthma, 47 was diagnosed with COVID-19 on 9/22-presented with worsening shortness of breath, cough-found to have acute hypoxic respiratory failure in the setting of COVID-19 pneumonia.   Subjective:    [name] today    Assessment  & Plan :   Acute Hypoxic Resp Failure due to Covid 19 Viral pneumonia: Improving-on 1.5 L of oxygen of oxygen this morning.  Continued attempts to titrate down oxygen.  Continue steroids and Remdesivir.  Fever: afebrile  O2 requirements: On 1.5 l/m  COVID-19 Labs: Recent Labs    05/07/19 1924 05/09/19 0251  DDIMER 0.80* 0.49  FERRITIN 502* 566*  LDH 257*  --   CRP 3.3* 2.9*    Lab Results  Component Value Date   SARSCOV2NAA Detected (A) 05/04/2019     COVID-19 Medications: Steroids: 9/25>> Remdesivir: 9/25>> Actemra: Not indicated Convalescent Plasma: Not indicated  Research Studies:N/A  Other medications: Diuretics:Euvolemic-no need for lasix Antibiotics:Not needed as no evidence of bacterial infection  Prone/Incentive Spirometry: encouraged patient to lie prone for 3-4 hours at a time for a total of 16 hours a day, and to encourage incentive spirometry use 3-4/hour.  DVT Prophylaxis  :  Lovenox   Elevated troponins: Trend is flat-not consistent with ACS- EKG nonacute, echo with preserved EF and no wall motion  abnormality.  Suspect likely demand ischemia-no further work-up required.  Transaminitis: Secondary to COVID-19-acute hepatitis serology negative  HTN: Controlled-continue losartan  Asthma: Stable-continue as needed bronchodilators  Obesity: Estimated body mass index is 38.4 kg/m as calculated from the following:   Height as of this encounter: 5\' 9"  (1.753 m).   Weight as of this encounter: 117.9 kg.   ABG: No results found  for: PHART, PCO2ART, PO2ART, HCO3, TCO2, ACIDBASEDEF, O2SAT  Vent Settings: N/A  Condition -Stable  Family Communication  :  None at bedside  Code Status :  Full Code  Diet :  Diet Order            Diet Heart Room service appropriate? Yes; Fluid consistency: Thin  Diet effective now               Disposition Plan  :  Remain hospitalized  Barriers to discharge:still hypoxic-need to wean O2 further  Consults  :  None  Procedures  :  None  Antibiotics  :    Anti-infectives (From admission, onward)   Start     Dose/Rate Route Frequency Ordered Stop   05/09/19 1600  remdesivir 100 mg in sodium chloride 0.9 % 250 mL IVPB     100 mg 500 mL/hr over 30 Minutes Intravenous Every 24 hours 05/08/19 1515 05/13/19 1559   05/08/19 1600  remdesivir 200 mg in sodium chloride 0.9 % 250 mL IVPB     200 mg 500 mL/hr over 30 Minutes Intravenous Once 05/08/19 1515 05/08/19 1647   05/08/19 0302  azithromycin (ZITHROMAX) 500 mg in sodium chloride 0.9 % 250 mL IVPB  Status:  Discontinued     500 mg 250 mL/hr over 60 Minutes Intravenous Every 24 hours 05/08/19 0243 05/08/19 1236      Inpatient Medications  Scheduled Meds:  albuterol  2 puff Inhalation TID   enoxaparin (LOVENOX) injection  60 mg Subcutaneous Q24H   latanoprost  1 drop Both Eyes QHS   losartan  50 mg Oral Daily   methylPREDNISolone (SOLU-MEDROL) injection  40 mg Intravenous Q12H   multivitamin with minerals  1 tablet Oral Daily   pantoprazole  40 mg Oral Daily   Continuous  Infusions:  remdesivir 100 mg in NS 250 mL     PRN Meds:.albuterol, benzonatate, ondansetron (ZOFRAN) IV, sodium chloride   Time Spent in minutes  25  See all Orders from today for further details   Oren Binet M.D on 05/09/2019 at 1:28 PM  To page go to [email or web address] - use universal password  [facility] -  Office  [phone]    Objective:   Vitals:   05/08/19 1555 05/08/19 2000 05/09/19 0436 05/09/19 0825  BP: (!) 112/57 101/68 97/87 (!) 103/92  Pulse: 72 68 73 72  Resp:  (!) 24 18   Temp: 100.1 F (37.8 C) 98.1 F (36.7 C) 98.8 F (37.1 C) 98.3 F (36.8 C)  TempSrc: Oral Oral Oral Oral  SpO2: 93% 94% 94% 92%  Weight:      Height:        Wt Readings from Last 3 Encounters:  05/07/19 117.9 kg  03/12/19 120.2 kg  03/10/19 124.7 kg     Intake/Output Summary (Last 24 hours) at 05/09/2019 1328 Last data filed at 05/09/2019 0800 Gross per 24 hour  Intake 1592.81 ml  Output 900 ml  Net 692.81 ml     Physical Exam Gen Exam:Alert awake-not in any distress HEENT:atraumatic, normocephalic Chest: B/L clear to auscultation anteriorly CVS:S1S2 regular Abdomen:soft non tender, non distended Extremities:no edema Neurology: Non focal Skin: no rash   Data Review:    CBC Recent Labs  Lab 05/07/19 1924 05/08/19 0516 05/09/19 0251  WBC 4.6 3.4* 2.3*  HGB 15.9 14.6 14.5  HCT 47.5 42.1 41.9  PLT 127* 114* 133*  MCV 90.3 88.4 87.8  MCH 30.2 30.7 30.4  MCHC 33.5 34.7 34.6  RDW 13.3 13.1 13.0  LYMPHSABS 0.7 1.1 0.5*  MONOABS 0.3 0.3 0.2  EOSABS 0.0 0.0 0.0  BASOSABS 0.0 0.0 0.0    Chemistries  Recent Labs  Lab 05/07/19 1924 05/08/19 0516 05/09/19 0251  NA 132* 130* 134*  K 3.4* 3.5 3.5  CL 95* 97* 98  CO2 24 20* 24  GLUCOSE 109* 109* 140*  BUN 16 16 16   CREATININE 1.12 0.99 0.79  CALCIUM 8.9 8.1* 8.6*  AST 107* 97* 97*  ALT 102* 88* 78*  ALKPHOS 56 49 45  BILITOT 0.5 0.7 0.6    ------------------------------------------------------------------------------------------------------------------ No results for input(s): CHOL, HDL, LDLCALC, TRIG, CHOLHDL, LDLDIRECT in the last 72 hours.  Lab Results  Component Value Date   HGBA1C  01/05/2011    5.5 (NOTE)                                                                       According to the ADA Clinical Practice Recommendations for 2011, when HbA1c is used as a screening test:   >=6.5%   Diagnostic of Diabetes Mellitus           (if abnormal result  is confirmed)  5.7-6.4%   Increased risk of developing Diabetes Mellitus  References:Diagnosis and Classification of Diabetes Mellitus,Diabetes Care,2011,34(Suppl 1):S62-S69 and Standards of Medical Care in         Diabetes - 2011,Diabetes [id number]  (Suppl 1):S11-S61.   ------------------------------------------------------------------------------------------------------------------ No results for input(s): TSH, T4TOTAL, T3FREE, THYROIDAB in the last 72 hours.  Invalid input(s): FREET3 ------------------------------------------------------------------------------------------------------------------ Recent Labs    05/07/19 1924 05/09/19 0251  FERRITIN 502* 566*    Coagulation profile No results for input(s): INR, PROTIME in the last 168 hours.  Recent Labs    05/07/19 1924 05/09/19 0251  DDIMER 0.80* 0.49    Cardiac Enzymes Recent Labs  Lab 05/08/19 0516  CKMB 2.2   ------------------------------------------------------------------------------------------------------------------    Component Value Date/Time   BNP 14.0 05/07/2019 1924    Micro Results Recent Results (from the past 240 hour(s))  Novel Coronavirus, NAA ([facility])     Status: Abnormal   Collection Time: 05/04/19 12:00 AM   Specimen: Oropharyngeal(OP) collection in vial transport medium   OROPHARYNGEA  TESTING  Result Value Ref Range Status   SARS-CoV-2, NAA Detected (A) Not  Detected Final    Comment: This nucleic acid amplification test was developed and its performance characteristics determined by [employer]. Nucleic acid amplification tests include PCR and TMA. This test has not been FDA cleared or approved. This test has been authorized by FDA under an Emergency Use Authorization (EUA). This test is only authorized for the duration of time the declaration that circumstances exist justifying the authorization of the emergency use of in vitro diagnostic tests for detection of SARS-CoV-2 virus and/or diagnosis of COVID-19 infection under section 564(b)(1) of the Act, 21 U.S.C. [id number]) (1), unless the authorization is terminated or revoked sooner. When diagnostic testing is negative, the possibility of a false negative result should be considered in the context of a patient's recent exposures and the presence of clinical signs and symptoms consistent with COVID-19. An individual without symptoms of COVID-19 and who is not shedding SARS-CoV-2 virus would  expect to have a negative (not detected)  result in this assay.     Radiology Reports Ct Angio Chest Pe W Or Wo Contrast  Result Date: 05/08/2019 CLINICAL DATA:  53 year old male with chest pain and shortness of breath. EXAM: CT ANGIOGRAPHY CHEST WITH CONTRAST TECHNIQUE: Multidetector CT imaging of the chest was performed using the standard protocol during bolus administration of intravenous contrast. Multiplanar CT image reconstructions and MIPs were obtained to evaluate the vascular anatomy. CONTRAST:  139mL OMNIPAQUE IOHEXOL 350 MG/ML SOLN COMPARISON:  Chest CT dated 03/10/2019 FINDINGS: Cardiovascular: There is mild cardiomegaly. No pericardial effusion. Advanced 3 vessel coronary vascular calcification is noted. There is mild atherosclerotic calcification of the aortic arch. No aneurysm or dissection. Evaluation of the pulmonary arteries is very limited due to respiratory motion artifact and  suboptimal opacification and timing of the contrast. No definite large or central pulmonary artery embolus identified. Mediastinum/Nodes: Mildly enlarged left hilar lymph node measures 14 mm. Subcarinal lymph node measures 13 mm in short axis. The esophagus and thyroid gland are grossly unremarkable. No mediastinal fluid collection. Lungs/Pleura: Bilateral upper lobe and to a lesser degree lower lobe streaky and ground-glass densities noted most prominent involving the right upper lobe and most consistent with pneumonia, possibly atypical agent. Clinical correlation is recommended. No lobar consolidation, pleural effusion, or pneumothorax. The central airways are patent. Upper Abdomen: There is fatty infiltration of the liver. Musculoskeletal: Degenerative changes of the spine. No acute osseous pathology. Review of the MIP images confirms the above findings. IMPRESSION: 1. No CT evidence of central pulmonary artery embolus. Evaluation however is very limited due to respiratory motion artifact. 2. Bilateral airspace densities most consistent with multifocal pneumonia. Clinical correlation and follow-up to resolution recommended. 3. Mild cardiomegaly with advanced 3 vessel coronary vascular calcification. 4. Mildly enlarged left hilar and subcarinal lymph nodes, likely reactive. 5. Fatty liver. Aortic Atherosclerosis (ICD10-I70.0). Electronically Signed   By: Anner Crete M.D.   On: 05/08/2019 03:37   Dg Chest Portable 1 View  Result Date: 05/07/2019 CLINICAL DATA:  53 year old male with toe injury and shortness of breath EXAM: PORTABLE CHEST 1 VIEW COMPARISON:  March 10, 2019 FINDINGS: Cardiomediastinal silhouette unchanged in size and contour. Similar appearance of asymmetric elevation the right hemidiaphragm with low lung volumes. Coarsened interstitial markings of the bilateral lungs. No pneumothorax. No pleural effusion. No confluent airspace disease. IMPRESSION: Low lung volumes with asymmetric elevation  of the right hemidiaphragm and likely atelectasis with no evidence of superimposed acute cardiopulmonary disease. Electronically Signed   By: Corrie Mckusick D.O.   On: 05/07/2019 20:51   US Abdomen Limited Ruq  Result Date: 05/08/2019 CLINICAL DATA:  Right upper quadrant pain.  COVID-19 positive EXAM: ULTRASOUND ABDOMEN LIMITED RIGHT UPPER QUADRANT COMPARISON:  None. FINDINGS: Gallbladder: No gallstones or wall thickening visualized. There is no pericholecystic fluid. No sonographic Murphy sign noted by sonographer. Common bile duct: Diameter: 4 mm. No intrahepatic or extrahepatic biliary duct dilatation. Liver: No focal lesion identified. Liver echogenicity is increased diffusely. Portal vein is patent on color Doppler imaging with normal direction of blood flow towards the liver. Other: None. IMPRESSION: Diffuse increase in liver echogenicity, a finding indicative of hepatic steatosis. While no focal liver lesions are evident on this study, it must be cautioned that the sensitivity of ultrasound for detection of focal liver lesions is diminished significantly in this circumstance. Study otherwise unremarkable. Electronically Signed   By: Lowella Grip III M.D.   On: 05/08/2019 07:59

## 2019-05-09 NOTE — TOC Progression Note (Signed)
Transition of Care [facility]) - Progression Note    Patient Details  Name: [name] MRN: [id number] Date of Birth: 03/11/1966  Transition [facility] Care [facility]) CM/SW Contact  Loletha Grayer Beverely Pace, RN Phone Number: 05/09/2019, 1:57 PM  Clinical Narrative:            Expected Discharge Plan and Services                                                 Social Determinants of Health (SDOH) Interventions    Readmission Risk Interventions No flowsheet data found.

## 2019-05-10 LAB — C-REACTIVE PROTEIN: CRP: 1 mg/dL — ABNORMAL HIGH (ref <1.0)

## 2019-05-10 LAB — COMPREHENSIVE METABOLIC PANEL
ALT: 75 U/L — ABNORMAL HIGH (ref 0–44)
AST: 100 U/L — ABNORMAL HIGH (ref 15–41)
Albumin: 3.7 g/dL (ref 3.5–5.0)
Alkaline Phosphatase: 42 U/L (ref 38–126)
Anion gap: 11 (ref 5–15)
BUN: 19 mg/dL (ref 6–20)
CO2: 24 mmol/L (ref 22–32)
Calcium: 8.4 mg/dL — ABNORMAL LOW (ref 8.9–10.3)
Chloride: 99 mmol/L (ref 98–111)
Creatinine, Ser: 0.9 mg/dL (ref 0.61–1.24)
GFR calc Af Amer: >60 mL/min (ref >60)
GFR calc non Af Amer: >60 mL/min (ref >60)
Glucose, Bld: 145 mg/dL — ABNORMAL HIGH (ref 70–99)
Potassium: 3.6 mmol/L (ref 3.5–5.1)
Sodium: 134 mmol/L — ABNORMAL LOW (ref 135–145)
Total Bilirubin: 0.5 mg/dL (ref 0.3–1.2)
Total Protein: 6.5 g/dL (ref 6.5–8.1)

## 2019-05-10 LAB — CBC WITH DIFFERENTIAL/PLATELET
Abs Immature Granulocytes: 0.01 10*3/uL (ref 0.00–0.07)
Basophils Absolute: 0 10*3/uL (ref 0.0–0.1)
Basophils Relative: 0 %
Eosinophils Absolute: 0 10*3/uL (ref 0.0–0.5)
Eosinophils Relative: 0 %
HCT: 40.4 % (ref 39.0–52.0)
Hemoglobin: 14.1 g/dL (ref 13.0–17.0)
Immature Granulocytes: 0 %
Lymphocytes Relative: 14 %
Lymphs Abs: 0.6 10*3/uL — ABNORMAL LOW (ref 0.7–4.0)
MCH: 30.7 pg (ref 26.0–34.0)
MCHC: 34.9 g/dL (ref 30.0–36.0)
MCV: 88 fL (ref 80.0–100.0)
Monocytes Absolute: 0.4 10*3/uL (ref 0.1–1.0)
Monocytes Relative: 9 %
Neutro Abs: 3.3 10*3/uL (ref 1.7–7.7)
Neutrophils Relative %: 77 %
Platelets: 135 10*3/uL — ABNORMAL LOW (ref 150–400)
RBC: 4.59 MIL/uL (ref 4.22–5.81)
RDW: 13.1 % (ref 11.5–15.5)
WBC: 4.3 10*3/uL (ref 4.0–10.5)
nRBC: 0 % (ref 0.0–0.2)

## 2019-05-10 LAB — D-DIMER, QUANTITATIVE: D-Dimer, Quant: 0.39 ug/mL-FEU (ref 0.00–0.50)

## 2019-05-10 LAB — FERRITIN: Ferritin: 530 ng/mL — ABNORMAL HIGH (ref 24–336)

## 2019-05-10 MED ORDER — ASPIRIN 81 MG PO CHEW
81.0000 mg | CHEWABLE_TABLET | Freq: Every day | ORAL | Status: DC
  Administered 2019-05-10 – 2019-05-12 (×3): 81 mg via ORAL
  Filled 2019-05-10 (×3): qty 1

## 2019-05-10 NOTE — Progress Notes (Signed)
Telephone call to patient's contact, Alroy Bailiff, no answer on mobile or home phone. Will try again later.

## 2019-05-10 NOTE — Progress Notes (Signed)
Patient ambulated around unit on room air.  Oxygen saturations dropped to 88%, denied SOB.  Tolerated well.

## 2019-05-10 NOTE — Progress Notes (Signed)
1901 Report received from [place] and care assumed. Pt walking around in room NADN Call bell in reach. Will continue to monitor

## 2019-05-10 NOTE — Progress Notes (Signed)
Telephone call to patient's wife, Kevis Ottaviani, updated on plan of care and answered all questions.

## 2019-05-10 NOTE — Progress Notes (Signed)
PROGRESS NOTE                                                                                                                                                                                                             Patient Demographics:    [name], is a 53 y.o. male, DOB - [birth year], WI:[id number]  Outpatient Primary MD for the patient is Lavone Orn, MD   Admit date - 05/07/2019   LOS - 3  Chief Complaint  Patient presents with   Shortness of Breath       Brief Narrative: Patient is a 53 y.o. male with PMHx of with history of HTN, asthma, 78 was diagnosed with COVID-19 on 9/22-presented with worsening shortness of breath, cough-found to have acute hypoxic respiratory failure in the setting of COVID-19 pneumonia.   Subjective:   Patient in bed, appears comfortable, denies any headache, no fever, no chest pain or pressure, no shortness of breath , no abdominal pain. No focal weakness.   Assessment  & Plan :   Acute Hypoxic Resp Failure due to Covid 19 Viral pneumonia: he had mild respiratory failure requiring 1-1/2 L of oxygen, he was started on IV Remdisvir and steroid course with good effect, he is now down to room air afebrile, inflammatory markers have stabilized.  Advance activity.  Prepare for discharge morning of 05/12/2019 once he finishes his Remdisvir.     COVID-19 Labs: Recent Labs    05/07/19 1924 05/09/19 0251 05/10/19 0245  DDIMER 0.80* 0.49 0.39  FERRITIN 502* 566* 530*  LDH 257*  --   --   CRP 3.3* 2.9* 1.0*    Lab Results  Component Value Date   SARSCOV2NAA Detected (A) 05/04/2019     COVID-19 Medications: Steroids: 9/25>> Remdesivir: 9/25>> Actemra: Not indicated Convalescent Plasma: Not indicated  Research Studies:N/A       Elevated troponins: Trend is flat-not consistent with ACS- EKG nonacute, echo with preserved EF and no wall motion abnormality.  Suspect likely  demand ischemia-no further work-up required.  Outpatient one-time cardiology follow-up will be beneficial will defer it to PCP.  Will place him on baby aspirin for now.  Transaminitis: Secondary to COVID-19-acute hepatitis serology negative  HTN: Controlled-continue losartan  Asthma: Stable-continue as needed bronchodilators  Obesity: BMI of 38 follow with PCP for weight loss.  Condition - Stable  Family Communication  :  None at bedside  Code Status :  Full Code  Diet :  Diet Order            Diet Heart Room service appropriate? Yes; Fluid consistency: Thin  Diet effective now               Disposition Plan  :  Remain hospitalized  Barriers to discharge: Finish medication course.  Consults  :  None  Procedures  :  None  Antibiotics  :    Anti-infectives (From admission, onward)   Start     Dose/Rate Route Frequency Ordered Stop   05/09/19 1600  remdesivir 100 mg in sodium chloride 0.9 % 250 mL IVPB     100 mg 500 mL/hr over 30 Minutes Intravenous Every 24 hours 05/08/19 1515 05/13/19 1559   05/08/19 1600  remdesivir 200 mg in sodium chloride 0.9 % 250 mL IVPB     200 mg 500 mL/hr over 30 Minutes Intravenous Once 05/08/19 1515 05/08/19 1647   05/08/19 0302  azithromycin (ZITHROMAX) 500 mg in sodium chloride 0.9 % 250 mL IVPB  Status:  Discontinued     500 mg 250 mL/hr over 60 Minutes Intravenous Every 24 hours 05/08/19 0243 05/08/19 1236      DVT Prophylaxis  :  Lovenox   Inpatient Medications  Scheduled Meds:  albuterol  2 puff Inhalation TID   enoxaparin (LOVENOX) injection  60 mg Subcutaneous Q24H   latanoprost  1 drop Both Eyes QHS   losartan  50 mg Oral Daily   methylPREDNISolone (SOLU-MEDROL) injection  40 mg Intravenous Q12H   multivitamin with minerals  1 tablet Oral Daily   pantoprazole  40 mg Oral Daily   Continuous Infusions:  remdesivir 100 mg in NS 250 mL 100 mg (05/09/19 1630)   PRN Meds:.albuterol, benzonatate, ondansetron  (ZOFRAN) IV, sodium chloride, zolpidem   Time Spent in minutes  25  See all Orders from today for further details   Lala Lund M.D on 05/10/2019 at 11:50 AM  To page go to [email or web address] - use universal password  [facility] -  Office  [phone]    Objective:   Vitals:   05/09/19 0825 05/09/19 2100 05/10/19 0400 05/10/19 0736  BP: (!) 103/92 123/69 108/70 114/70  Pulse: 72 93 69 73  Resp:  18    Temp: 98.3 F (36.8 C) 98.9 F (37.2 C) 98.7 F (37.1 C) 98.4 F (36.9 C)  TempSrc: Oral Oral Oral Oral  SpO2: 92% 90% 92% 90%  Weight:      Height:        Wt Readings from Last 3 Encounters:  05/07/19 117.9 kg  03/12/19 120.2 kg  03/10/19 124.7 kg     Intake/Output Summary (Last 24 hours) at 05/10/2019 1150 Last data filed at 05/10/2019 0800 Gross per 24 hour  Intake 730 ml  Output --  Net 730 ml     Physical Exam  Awake Alert, Oriented X 3, No new F.N deficits, Normal affect [place].AT,PERRAL Supple Neck,No JVD, No cervical lymphadenopathy appriciated.  Symmetrical Chest wall movement, Good air movement bilaterally, CTAB RRR,No Gallops, Rubs or new Murmurs, No Parasternal Heave +ve B.Sounds, Abd Soft, No tenderness, No organomegaly appriciated, No rebound - guarding or rigidity. No Cyanosis, Clubbing or edema, No new Rash or bruise    Data Review:    CBC Recent Labs  Lab 05/07/19 1924 05/08/19 0516 05/09/19 0251 05/10/19 0245  WBC 4.6 3.4*  2.3* 4.3  HGB 15.9 14.6 14.5 14.1  HCT 47.5 42.1 41.9 40.4  PLT 127* 114* 133* 135*  MCV 90.3 88.4 87.8 88.0  MCH 30.2 30.7 30.4 30.7  MCHC 33.5 34.7 34.6 34.9  RDW 13.3 13.1 13.0 13.1  LYMPHSABS 0.7 1.1 0.5* 0.6*  MONOABS 0.3 0.3 0.2 0.4  EOSABS 0.0 0.0 0.0 0.0  BASOSABS 0.0 0.0 0.0 0.0    Chemistries  Recent Labs  Lab 05/07/19 1924 05/08/19 0516 05/09/19 0251 05/10/19 0245  NA 132* 130* 134* 134*  K 3.4* 3.5 3.5 3.6  CL 95* 97* 98 99  CO2 24 20* 24 24  GLUCOSE 109* 109* 140* 145*  BUN 16  16 16 19   CREATININE 1.12 0.99 0.79 0.90  CALCIUM 8.9 8.1* 8.6* 8.4*  AST 107* 97* 97* 100*  ALT 102* 88* 78* 75*  ALKPHOS 56 49 45 42  BILITOT 0.5 0.7 0.6 0.5   ------------------------------------------------------------------------------------------------------------------ No results for input(s): CHOL, HDL, LDLCALC, TRIG, CHOLHDL, LDLDIRECT in the last 72 hours.  Lab Results  Component Value Date   HGBA1C  01/05/2011    5.5 (NOTE)                                                                       According to the ADA Clinical Practice Recommendations for 2011, when HbA1c is used as a screening test:   >=6.5%   Diagnostic of Diabetes Mellitus           (if abnormal result  is confirmed)  5.7-6.4%   Increased risk of developing Diabetes Mellitus  References:Diagnosis and Classification of Diabetes Mellitus,Diabetes Care,2011,34(Suppl 1):S62-S69 and Standards of Medical Care in         Diabetes - 2011,Diabetes [id number]  (Suppl 1):S11-S61.   ------------------------------------------------------------------------------------------------------------------ No results for input(s): TSH, T4TOTAL, T3FREE, THYROIDAB in the last 72 hours.  Invalid input(s): FREET3 ------------------------------------------------------------------------------------------------------------------ Recent Labs    05/09/19 0251 05/10/19 0245  FERRITIN 566* 530*    Coagulation profile No results for input(s): INR, PROTIME in the last 168 hours.  Recent Labs    05/09/19 0251 05/10/19 0245  DDIMER 0.49 0.39    Cardiac Enzymes Recent Labs  Lab 05/08/19 0516  CKMB 2.2   ------------------------------------------------------------------------------------------------------------------    Component Value Date/Time   BNP 14.0 05/07/2019 1924    Micro Results Recent Results (from the past 240 hour(s))  Novel Coronavirus, NAA ([facility])     Status: Abnormal   Collection Time: 05/04/19 12:00 AM     Specimen: Oropharyngeal(OP) collection in vial transport medium   OROPHARYNGEA  TESTING  Result Value Ref Range Status   SARS-CoV-2, NAA Detected (A) Not Detected Final    Comment: This nucleic acid amplification test was developed and its performance characteristics determined by [employer]. Nucleic acid amplification tests include PCR and TMA. This test has not been FDA cleared or approved. This test has been authorized by FDA under an Emergency Use Authorization (EUA). This test is only authorized for the duration of time the declaration that circumstances exist justifying the authorization of the emergency use of in vitro diagnostic tests for detection of SARS-CoV-2 virus and/or diagnosis of COVID-19 infection under section 564(b)(1) of the Act, 21 U.S.C. [id number]) (1), unless the authorization is terminated  or revoked sooner. When diagnostic testing is negative, the possibility of a false negative result should be considered in the context of a patient's recent exposures and the presence of clinical signs and symptoms consistent with COVID-19. An individual without symptoms of COVID-19 and who is not shedding SARS-CoV-2 virus would  expect to have a negative (not detected) result in this assay.     Radiology Reports Ct Angio Chest Pe W Or Wo Contrast  Result Date: 05/08/2019 CLINICAL DATA:  53 year old male with chest pain and shortness of breath. EXAM: CT ANGIOGRAPHY CHEST WITH CONTRAST TECHNIQUE: Multidetector CT imaging of the chest was performed using the standard protocol during bolus administration of intravenous contrast. Multiplanar CT image reconstructions and MIPs were obtained to evaluate the vascular anatomy. CONTRAST:  13mL OMNIPAQUE IOHEXOL 350 MG/ML SOLN COMPARISON:  Chest CT dated 03/10/2019 FINDINGS: Cardiovascular: There is mild cardiomegaly. No pericardial effusion. Advanced 3 vessel coronary vascular calcification is noted. There is mild  atherosclerotic calcification of the aortic arch. No aneurysm or dissection. Evaluation of the pulmonary arteries is very limited due to respiratory motion artifact and suboptimal opacification and timing of the contrast. No definite large or central pulmonary artery embolus identified. Mediastinum/Nodes: Mildly enlarged left hilar lymph node measures 14 mm. Subcarinal lymph node measures 13 mm in short axis. The esophagus and thyroid gland are grossly unremarkable. No mediastinal fluid collection. Lungs/Pleura: Bilateral upper lobe and to a lesser degree lower lobe streaky and ground-glass densities noted most prominent involving the right upper lobe and most consistent with pneumonia, possibly atypical agent. Clinical correlation is recommended. No lobar consolidation, pleural effusion, or pneumothorax. The central airways are patent. Upper Abdomen: There is fatty infiltration of the liver. Musculoskeletal: Degenerative changes of the spine. No acute osseous pathology. Review of the MIP images confirms the above findings. IMPRESSION: 1. No CT evidence of central pulmonary artery embolus. Evaluation however is very limited due to respiratory motion artifact. 2. Bilateral airspace densities most consistent with multifocal pneumonia. Clinical correlation and follow-up to resolution recommended. 3. Mild cardiomegaly with advanced 3 vessel coronary vascular calcification. 4. Mildly enlarged left hilar and subcarinal lymph nodes, likely reactive. 5. Fatty liver. Aortic Atherosclerosis (ICD10-I70.0). Electronically Signed   By: Anner Crete M.D.   On: 05/08/2019 03:37   Dg Chest Portable 1 View  Result Date: 05/07/2019 CLINICAL DATA:  53 year old male with toe injury and shortness of breath EXAM: PORTABLE CHEST 1 VIEW COMPARISON:  March 10, 2019 FINDINGS: Cardiomediastinal silhouette unchanged in size and contour. Similar appearance of asymmetric elevation the right hemidiaphragm with low lung volumes. Coarsened  interstitial markings of the bilateral lungs. No pneumothorax. No pleural effusion. No confluent airspace disease. IMPRESSION: Low lung volumes with asymmetric elevation of the right hemidiaphragm and likely atelectasis with no evidence of superimposed acute cardiopulmonary disease. Electronically Signed   By: Corrie Mckusick D.O.   On: 05/07/2019 20:51   US Abdomen Limited Ruq  Result Date: 05/08/2019 CLINICAL DATA:  Right upper quadrant pain.  COVID-19 positive EXAM: ULTRASOUND ABDOMEN LIMITED RIGHT UPPER QUADRANT COMPARISON:  None. FINDINGS: Gallbladder: No gallstones or wall thickening visualized. There is no pericholecystic fluid. No sonographic Murphy sign noted by sonographer. Common bile duct: Diameter: 4 mm. No intrahepatic or extrahepatic biliary duct dilatation. Liver: No focal lesion identified. Liver echogenicity is increased diffusely. Portal vein is patent on color Doppler imaging with normal direction of blood flow towards the liver. Other: None. IMPRESSION: Diffuse increase in liver echogenicity, a finding indicative of hepatic steatosis. While  no focal liver lesions are evident on this study, it must be cautioned that the sensitivity of ultrasound for detection of focal liver lesions is diminished significantly in this circumstance. Study otherwise unremarkable. Electronically Signed   By: Lowella Grip III M.D.   On: 05/08/2019 07:59

## 2019-05-11 LAB — COMPREHENSIVE METABOLIC PANEL
ALT: 72 U/L — ABNORMAL HIGH (ref 0–44)
AST: 86 U/L — ABNORMAL HIGH (ref 15–41)
Albumin: 3.9 g/dL (ref 3.5–5.0)
Alkaline Phosphatase: 41 U/L (ref 38–126)
Anion gap: 15 (ref 5–15)
BUN: 18 mg/dL (ref 6–20)
CO2: 21 mmol/L — ABNORMAL LOW (ref 22–32)
Calcium: 8.4 mg/dL — ABNORMAL LOW (ref 8.9–10.3)
Chloride: 100 mmol/L (ref 98–111)
Creatinine, Ser: 0.76 mg/dL (ref 0.61–1.24)
GFR calc Af Amer: >60 mL/min (ref >60)
GFR calc non Af Amer: >60 mL/min (ref >60)
Glucose, Bld: 129 mg/dL — ABNORMAL HIGH (ref 70–99)
Potassium: 3.5 mmol/L (ref 3.5–5.1)
Sodium: 136 mmol/L (ref 135–145)
Total Bilirubin: 0.6 mg/dL (ref 0.3–1.2)
Total Protein: 6.8 g/dL (ref 6.5–8.1)

## 2019-05-11 LAB — CBC WITH DIFFERENTIAL/PLATELET
Abs Immature Granulocytes: 0.02 10*3/uL (ref 0.00–0.07)
Basophils Absolute: 0 10*3/uL (ref 0.0–0.1)
Basophils Relative: 0 %
Eosinophils Absolute: 0 10*3/uL (ref 0.0–0.5)
Eosinophils Relative: 0 %
HCT: 42.5 % (ref 39.0–52.0)
Hemoglobin: 14.6 g/dL (ref 13.0–17.0)
Immature Granulocytes: 0 %
Lymphocytes Relative: 17 %
Lymphs Abs: 0.8 10*3/uL (ref 0.7–4.0)
MCH: 30.4 pg (ref 26.0–34.0)
MCHC: 34.4 g/dL (ref 30.0–36.0)
MCV: 88.4 fL (ref 80.0–100.0)
Monocytes Absolute: 0.6 10*3/uL (ref 0.1–1.0)
Monocytes Relative: 12 %
Neutro Abs: 3.2 10*3/uL (ref 1.7–7.7)
Neutrophils Relative %: 71 %
Platelets: 167 10*3/uL (ref 150–400)
RBC: 4.81 MIL/uL (ref 4.22–5.81)
RDW: 13.2 % (ref 11.5–15.5)
WBC: 4.6 10*3/uL (ref 4.0–10.5)
nRBC: 0 % (ref 0.0–0.2)

## 2019-05-11 LAB — FERRITIN: Ferritin: 654 ng/mL — ABNORMAL HIGH (ref 24–336)

## 2019-05-11 LAB — D-DIMER, QUANTITATIVE: D-Dimer, Quant: 0.31 ug/mL-FEU (ref 0.00–0.50)

## 2019-05-11 LAB — C-REACTIVE PROTEIN: CRP: <0.8 mg/dL (ref <1.0)

## 2019-05-11 MED ORDER — POTASSIUM CHLORIDE CRYS ER 20 MEQ PO TBCR
40.0000 meq | EXTENDED_RELEASE_TABLET | Freq: Once | ORAL | Status: AC
  Administered 2019-05-11: 12:00:00 40 meq via ORAL
  Filled 2019-05-11: qty 2

## 2019-05-11 MED ORDER — ZOLPIDEM TARTRATE 5 MG PO TABS
10.0000 mg | ORAL_TABLET | Freq: Every evening | ORAL | Status: DC | PRN
  Administered 2019-05-11: 21:00:00 10 mg via ORAL
  Filled 2019-05-11: qty 2

## 2019-05-11 NOTE — Progress Notes (Signed)
PROGRESS NOTE                                                                                                                                                                                                             Patient Demographics:    [name], is a 53 y.o. male, DOB - 1966-03-01, WI:[id number]  Outpatient Primary MD for the patient is Lavone Orn, MD   Admit date - 05/07/2019   LOS - 4  Chief Complaint  Patient presents with   Shortness of Breath       Brief Narrative: Patient is a 53 y.o. male with PMHx of with history of HTN, asthma, 61 was diagnosed with COVID-19 on 9/22-presented with worsening shortness of breath, cough-found to have acute hypoxic respiratory failure in the setting of COVID-19 pneumonia.   Subjective:   Patient in bed, appears comfortable, denies any headache, no fever, no chest pain or pressure, no shortness of breath , no abdominal pain. No focal weakness.   Assessment  & Plan :   Acute Hypoxic Resp Failure due to Covid 19 Viral pneumonia: he had mild respiratory failure requiring 1-2 L of oxygen, he was started on IV Remdisvir and steroid course with good effect, he is now down to room air afebrile, inflammatory markers have stabilized.  Advance activity.  Prepare for discharge morning of 05/12/2019 once he finishes his Remdisvir.  Note when he is asleep his oxygen requirements go up but I think that is due to undiagnosed underlying obesity related hypoventilation syndrome versus OSA.  In the daytime he is symptom-free and currently on 0.5 L nasal cannula oxygen with pulse ox of 93%.  Will try to titrate off oxygen if not he will go home on oxygen if needed.     COVID-19 Labs: Recent Labs    05/09/19 0251 05/10/19 0245 05/11/19 0240  DDIMER 0.49 0.39 0.31  FERRITIN 566* 530* 654*  CRP 2.9* 1.0* <0.8    Lab Results  Component Value Date   SARSCOV2NAA Detected (A)  05/04/2019     COVID-19 Medications: Steroids: 9/25>> Remdesivir: 9/25>> Actemra: Not indicated Convalescent Plasma: Not indicated  Research Studies:N/A      Elevated troponins: Trend is flat-not consistent with ACS- EKG nonacute, echo with preserved EF and no wall motion abnormality.  Suspect likely demand ischemia-no further work-up required.  Outpatient one-time cardiology follow-up will be beneficial will defer it to PCP.  Will place him on baby aspirin for now.  Transaminitis: Secondary to COVID-19-acute hepatitis serology negative  HTN: Controlled-continue losartan  Asthma: Stable-continue as needed bronchodilators  Obesity: BMI of 38 follow with PCP for weight loss.     Condition - Stable  Family Communication  :  None at bedside  Code Status :  Full Code  Diet :  Diet Order            Diet Heart Room service appropriate? Yes; Fluid consistency: Thin  Diet effective now               Disposition Plan  :  Remain hospitalized  Barriers to discharge: Finish medication course.  Consults  :  None  Procedures  :  None  Antibiotics  :    Anti-infectives (From admission, onward)   Start     Dose/Rate Route Frequency Ordered Stop   05/09/19 1600  remdesivir 100 mg in sodium chloride 0.9 % 250 mL IVPB     100 mg 500 mL/hr over 30 Minutes Intravenous Every 24 hours 05/08/19 1515 05/13/19 1559   05/08/19 1600  remdesivir 200 mg in sodium chloride 0.9 % 250 mL IVPB     200 mg 500 mL/hr over 30 Minutes Intravenous Once 05/08/19 1515 05/08/19 1647   05/08/19 0302  azithromycin (ZITHROMAX) 500 mg in sodium chloride 0.9 % 250 mL IVPB  Status:  Discontinued     500 mg 250 mL/hr over 60 Minutes Intravenous Every 24 hours 05/08/19 0243 05/08/19 1236      DVT Prophylaxis  :  Lovenox   Inpatient Medications  Scheduled Meds:  albuterol  2 puff Inhalation TID   aspirin  81 mg Oral Daily   enoxaparin (LOVENOX) injection  60 mg Subcutaneous Q24H   latanoprost   1 drop Both Eyes QHS   losartan  50 mg Oral Daily   methylPREDNISolone (SOLU-MEDROL) injection  40 mg Intravenous Q12H   multivitamin with minerals  1 tablet Oral Daily   pantoprazole  40 mg Oral Daily   Continuous Infusions:  remdesivir 100 mg in NS 250 mL Stopped (05/10/19 1635)   PRN Meds:.albuterol, benzonatate, ondansetron (ZOFRAN) IV, sodium chloride, zolpidem   Time Spent in minutes  25  See all Orders from today for further details   Lala Lund M.D on 05/11/2019 at 10:28 AM  To page go to [email or web address] - use universal password  [facility] -  Office  [phone]    Objective:   Vitals:   05/10/19 2000 05/10/19 2017 05/11/19 0600 05/11/19 0806  BP:  119/71 (!) 120/56 115/74  Pulse: 74   75  Resp: 18     Temp: 98.4 F (36.9 C)  98.4 F (36.9 C) 98 F (36.7 C)  TempSrc: Oral  Oral Oral  SpO2: 90%   93%  Weight:      Height:        Wt Readings from Last 3 Encounters:  05/07/19 117.9 kg  03/12/19 120.2 kg  03/10/19 124.7 kg     Intake/Output Summary (Last 24 hours) at 05/11/2019 1028 Last data filed at 05/10/2019 1800 Gross per 24 hour  Intake 730 ml  Output --  Net 730 ml     Physical Exam  Awake Alert, Oriented X 3, No new F.N deficits, Normal affect [place].AT,PERRAL Supple Neck,No JVD, No cervical lymphadenopathy appriciated.  Symmetrical Chest wall movement, Good air movement bilaterally, CTAB RRR,No  Gallops, Rubs or new Murmurs, No Parasternal Heave +ve B.Sounds, Abd Soft, No tenderness, No organomegaly appriciated, No rebound - guarding or rigidity. No Cyanosis, Clubbing or edema, No new Rash or bruise     Data Review:    CBC Recent Labs  Lab 05/07/19 1924 05/08/19 0516 05/09/19 0251 05/10/19 0245 05/11/19 0240  WBC 4.6 3.4* 2.3* 4.3 4.6  HGB 15.9 14.6 14.5 14.1 14.6  HCT 47.5 42.1 41.9 40.4 42.5  PLT 127* 114* 133* 135* 167  MCV 90.3 88.4 87.8 88.0 88.4  MCH 30.2 30.7 30.4 30.7 30.4  MCHC 33.5 34.7 34.6 34.9 34.4   RDW 13.3 13.1 13.0 13.1 13.2  LYMPHSABS 0.7 1.1 0.5* 0.6* 0.8  MONOABS 0.3 0.3 0.2 0.4 0.6  EOSABS 0.0 0.0 0.0 0.0 0.0  BASOSABS 0.0 0.0 0.0 0.0 0.0    Chemistries  Recent Labs  Lab 05/07/19 1924 05/08/19 0516 05/09/19 0251 05/10/19 0245 05/11/19 0240  NA 132* 130* 134* 134* 136  K 3.4* 3.5 3.5 3.6 3.5  CL 95* 97* 98 99 100  CO2 24 20* 24 24 21*  GLUCOSE 109* 109* 140* 145* 129*  BUN 16 16 16 19 18   CREATININE 1.12 0.99 0.79 0.90 0.76  CALCIUM 8.9 8.1* 8.6* 8.4* 8.4*  AST 107* 97* 97* 100* 86*  ALT 102* 88* 78* 75* 72*  ALKPHOS 56 49 45 42 41  BILITOT 0.5 0.7 0.6 0.5 0.6   ------------------------------------------------------------------------------------------------------------------ No results for input(s): CHOL, HDL, LDLCALC, TRIG, CHOLHDL, LDLDIRECT in the last 72 hours.  Lab Results  Component Value Date   HGBA1C  01/05/2011    5.5 (NOTE)                                                                       According to the ADA Clinical Practice Recommendations for 2011, when HbA1c is used as a screening test:   >=6.5%   Diagnostic of Diabetes Mellitus           (if abnormal result  is confirmed)  5.7-6.4%   Increased risk of developing Diabetes Mellitus  References:Diagnosis and Classification of Diabetes Mellitus,Diabetes Care,2011,34(Suppl 1):S62-S69 and Standards of Medical Care in         Diabetes - 2011,Diabetes [id number]  (Suppl 1):S11-S61.   ------------------------------------------------------------------------------------------------------------------ No results for input(s): TSH, T4TOTAL, T3FREE, THYROIDAB in the last 72 hours.  Invalid input(s): FREET3 ------------------------------------------------------------------------------------------------------------------ Recent Labs    05/10/19 0245 05/11/19 0240  FERRITIN 530* 654*    Coagulation profile No results for input(s): INR, PROTIME in the last 168 hours.  Recent Labs     05/10/19 0245 05/11/19 0240  DDIMER 0.39 0.31    Cardiac Enzymes Recent Labs  Lab 05/08/19 0516  CKMB 2.2   ------------------------------------------------------------------------------------------------------------------    Component Value Date/Time   BNP 14.0 05/07/2019 1924    Micro Results Recent Results (from the past 240 hour(s))  Novel Coronavirus, NAA ([facility])     Status: Abnormal   Collection Time: 05/04/19 12:00 AM   Specimen: Oropharyngeal(OP) collection in vial transport medium   OROPHARYNGEA  TESTING  Result Value Ref Range Status   SARS-CoV-2, NAA Detected (A) Not Detected Final    Comment: This nucleic acid amplification test was developed and its performance characteristics  determined by [employer]. Nucleic acid amplification tests include PCR and TMA. This test has not been FDA cleared or approved. This test has been authorized by FDA under an Emergency Use Authorization (EUA). This test is only authorized for the duration of time the declaration that circumstances exist justifying the authorization of the emergency use of in vitro diagnostic tests for detection of SARS-CoV-2 virus and/or diagnosis of COVID-19 infection under section 564(b)(1) of the Act, 21 U.S.C. [id number]) (1), unless the authorization is terminated or revoked sooner. When diagnostic testing is negative, the possibility of a false negative result should be considered in the context of a patient's recent exposures and the presence of clinical signs and symptoms consistent with COVID-19. An individual without symptoms of COVID-19 and who is not shedding SARS-CoV-2 virus would  expect to have a negative (not detected) result in this assay.     Radiology Reports Ct Angio Chest Pe W Or Wo Contrast  Result Date: 05/08/2019 CLINICAL DATA:  53 year old male with chest pain and shortness of breath. EXAM: CT ANGIOGRAPHY CHEST WITH CONTRAST TECHNIQUE: Multidetector CT imaging of  the chest was performed using the standard protocol during bolus administration of intravenous contrast. Multiplanar CT image reconstructions and MIPs were obtained to evaluate the vascular anatomy. CONTRAST:  184mL OMNIPAQUE IOHEXOL 350 MG/ML SOLN COMPARISON:  Chest CT dated 03/10/2019 FINDINGS: Cardiovascular: There is mild cardiomegaly. No pericardial effusion. Advanced 3 vessel coronary vascular calcification is noted. There is mild atherosclerotic calcification of the aortic arch. No aneurysm or dissection. Evaluation of the pulmonary arteries is very limited due to respiratory motion artifact and suboptimal opacification and timing of the contrast. No definite large or central pulmonary artery embolus identified. Mediastinum/Nodes: Mildly enlarged left hilar lymph node measures 14 mm. Subcarinal lymph node measures 13 mm in short axis. The esophagus and thyroid gland are grossly unremarkable. No mediastinal fluid collection. Lungs/Pleura: Bilateral upper lobe and to a lesser degree lower lobe streaky and ground-glass densities noted most prominent involving the right upper lobe and most consistent with pneumonia, possibly atypical agent. Clinical correlation is recommended. No lobar consolidation, pleural effusion, or pneumothorax. The central airways are patent. Upper Abdomen: There is fatty infiltration of the liver. Musculoskeletal: Degenerative changes of the spine. No acute osseous pathology. Review of the MIP images confirms the above findings. IMPRESSION: 1. No CT evidence of central pulmonary artery embolus. Evaluation however is very limited due to respiratory motion artifact. 2. Bilateral airspace densities most consistent with multifocal pneumonia. Clinical correlation and follow-up to resolution recommended. 3. Mild cardiomegaly with advanced 3 vessel coronary vascular calcification. 4. Mildly enlarged left hilar and subcarinal lymph nodes, likely reactive. 5. Fatty liver. Aortic Atherosclerosis  (ICD10-I70.0). Electronically Signed   By: Anner Crete M.D.   On: 05/08/2019 03:37   Dg Chest Portable 1 View  Result Date: 05/07/2019 CLINICAL DATA:  53 year old male with toe injury and shortness of breath EXAM: PORTABLE CHEST 1 VIEW COMPARISON:  March 10, 2019 FINDINGS: Cardiomediastinal silhouette unchanged in size and contour. Similar appearance of asymmetric elevation the right hemidiaphragm with low lung volumes. Coarsened interstitial markings of the bilateral lungs. No pneumothorax. No pleural effusion. No confluent airspace disease. IMPRESSION: Low lung volumes with asymmetric elevation of the right hemidiaphragm and likely atelectasis with no evidence of superimposed acute cardiopulmonary disease. Electronically Signed   By: Corrie Mckusick D.O.   On: 05/07/2019 20:51   US Abdomen Limited Ruq  Result Date: 05/08/2019 CLINICAL DATA:  Right upper quadrant pain.  COVID-19  positive EXAM: ULTRASOUND ABDOMEN LIMITED RIGHT UPPER QUADRANT COMPARISON:  None. FINDINGS: Gallbladder: No gallstones or wall thickening visualized. There is no pericholecystic fluid. No sonographic Murphy sign noted by sonographer. Common bile duct: Diameter: 4 mm. No intrahepatic or extrahepatic biliary duct dilatation. Liver: No focal lesion identified. Liver echogenicity is increased diffusely. Portal vein is patent on color Doppler imaging with normal direction of blood flow towards the liver. Other: None. IMPRESSION: Diffuse increase in liver echogenicity, a finding indicative of hepatic steatosis. While no focal liver lesions are evident on this study, it must be cautioned that the sensitivity of ultrasound for detection of focal liver lesions is diminished significantly in this circumstance. Study otherwise unremarkable. Electronically Signed   By: Lowella Grip III M.D.   On: 05/08/2019 07:59

## 2019-05-11 NOTE — Progress Notes (Signed)
SATURATION QUALIFICATIONS: (This note is used to comply with regulatory documentation for home oxygen)  Patient Saturations on Room Air at Rest = 91%  Patient Saturations on Room Air while Ambulating = 94%  Patient Saturations on 0 Liters of oxygen while Ambulating = 94%  Please briefly explain why patient needs home oxygen: N/A

## 2019-05-11 NOTE — Plan of Care (Signed)
Increased exercise tolerance. Pt ambulated x2 on RA with no issues. VSS. Call light in reach.  Problem: Education: Goal: Knowledge of risk factors and measures for prevention of condition will improve Outcome: Progressing   Problem: Coping: Goal: Psychosocial and spiritual needs will be supported Outcome: Progressing   Problem: Respiratory: Goal: Will maintain a patent airway Outcome: Progressing Goal: Complications related to the disease process, condition or treatment will be avoided or minimized Outcome: Progressing

## 2019-05-12 MED ORDER — ATORVASTATIN CALCIUM 40 MG PO TABS: 40.0000 mg | ORAL_TABLET | Freq: Every day | ORAL | Status: DC

## 2019-05-12 MED ORDER — NEBIVOLOL HCL 5 MG PO TABS: 5.0000 mg | ORAL_TABLET | Freq: Every day | ORAL | 0 refills | Status: DC

## 2019-05-12 MED ORDER — METHYLPREDNISOLONE 4 MG PO TBPK: ORAL_TABLET | ORAL | 0 refills | Status: DC

## 2019-05-12 MED ORDER — ALBUTEROL SULFATE HFA 108 (90 BASE) MCG/ACT IN AERS: 2.0000 | INHALATION_SPRAY | Freq: Four times a day (QID) | RESPIRATORY_TRACT | 0 refills | Status: AC | PRN

## 2019-05-12 MED ORDER — INFLUENZA VAC SPLIT QUAD 0.5 ML IM SUSY
0.5000 mL | PREFILLED_SYRINGE | INTRAMUSCULAR | Status: AC
  Administered 2019-05-12: 0.5 mL via INTRAMUSCULAR
  Filled 2019-05-12: qty 0.5

## 2019-05-12 MED ORDER — ASPIRIN 81 MG PO CHEW: 81.0000 mg | CHEWABLE_TABLET | Freq: Every day | ORAL | 0 refills | Status: AC

## 2019-05-12 MED ORDER — ATORVASTATIN CALCIUM 40 MG PO TABS: 40.0000 mg | ORAL_TABLET | Freq: Every day | ORAL | 0 refills | Status: DC

## 2019-05-12 MED ORDER — NEBIVOLOL HCL 5 MG PO TABS
5.0000 mg | ORAL_TABLET | Freq: Every day | ORAL | Status: DC
  Administered 2019-05-12: 11:00:00 5 mg via ORAL
  Filled 2019-05-12 (×2): qty 1

## 2019-05-12 NOTE — Discharge Summary (Signed)
[name] [id number] DOB: [birth year] DOA: 05/07/2019  PCP: Lavone Orn, MD  Admit date: 05/07/2019  Discharge date: 05/12/2019  Admitted From: Home   Disposition:  Home   Recommendations for Outpatient Follow-up:   Follow up with PCP in 1-2 weeks  PCP Please obtain BMP/CBC, 2 view CXR in 1week,  (see Discharge instructions)   PCP Please follow up on the following pending results: Will benefit from one-time outpatient cardiology follow-up and echocardiogram.  Consider repeat outpatient sleep study.   Home Health: None   Equipment/Devices: None  Consultations: None  Discharge Condition: Stable    CODE STATUS: Full    Diet Recommendation: Heart Healthy     Chief Complaint  Patient presents with   Shortness of Breath     Brief history of present illness from the day of admission and additional interim summary    Patient is a 53 y.o. male with PMHx of with history of HTN, asthma, OSA-who was diagnosed with COVID-19 on 9/22-presented with worsening shortness of breath, cough-found to have acute hypoxic respiratory failure in the setting of COVID-19 pneumonia.                                                                 Hospital Course   Acute Hypoxic Resp Failure due to Covid 19 Viral pneumonia: he had mild respiratory failure requiring 1-2 L of oxygen, he was started on IV Remdisvir and steroid course with good effect, he is now down to room air at rest and afebrile, inflammatory markers have stabilized.    Patient is symptom-free at rest but when he ambulates at times he draws drop his oxygen saturations hence will get home oxygen for ambulating and for symptoms for a short while.  He will also get a steroid taper and albuterol inhaler and will follow with PCP within the next 7 to 10 days.  He is  completely symptom-free at rest and eager to go home.  Note when he is asleep his oxygen requirements go up but I think that is due to undiagnosed underlying obesity related hypoventilation syndrome versus OSA.  He has had diagnosis of sleep apnea in the past but refuses to use CPAP, will defer PCP to manage it in the outpatient setting.  COVID-19 Labs  Recent Labs    05/10/19 0245 05/11/19 0240  DDIMER 0.39 0.31  FERRITIN 530* 654*  CRP 1.0* <0.8    Lab Results  Component Value Date   SARSCOV2NAA Detected (A) 05/04/2019     Elevated troponins: Trend is flat-not consistent with ACS- EKG nonacute, echo with preserved EF and no wall motion abnormality.  Suspect likely demand ischemia-no further inpatient work-up required.  For now he has been placed on low-dose beta-blocker, baby aspirin and statin.  Will benefit from outpatient cardiology follow-up  in 7 to 10 days.  Transaminitis: Secondary to COVID-19-acute hepatitis serology negative  HTN: Controlled-continue losartan, low-dose beta-blocker added for better control.  Asthma: Stable-continue as needed bronchodilators, no wheezing.  Obesity: BMI of 38 follow with PCP for weight loss.    Discharge diagnosis     Principal Problem:   COVID-19 virus infection Active Problems:   Essential hypertension   Abnormal liver function   Elevated troponin    Discharge instructions    Discharge Instructions    MyChart COVID-19 home monitoring program   Complete by: May 12, 2019    Is the patient willing to use the [facility] for home monitoring?: Yes   Temperature monitoring   Complete by: May 12, 2019    After how many days would you like to receive a notification of this patient's flowsheet entries?: 1      Discharge Medications   Allergies as of 05/12/2019      Reactions   Neosporin [neomycin-bacitracin Zn-polymyx]    unknown   Penicillins    Did it involve swelling of the face/tongue/throat, SOB, or low  BP? Unknown Did it involve sudden or severe rash/hives, skin peeling, or any reaction on the inside of your mouth or nose? Unknown Did you need to seek medical attention at a hospital or doctor's office? Unknown When did it last happen?Childhood  If all above answers are NO, may proceed with cephalosporin use. Unknown reaction   Tramadol Hcl Hives, Itching      Medication List    STOP taking these medications   doxycycline 100 MG tablet Commonly known as: VIBRA-TABS   predniSONE 10 MG tablet Commonly known as: DELTASONE     TAKE these medications   albuterol 108 (90 Base) MCG/ACT inhaler Commonly known as: ProAir HFA Inhale 2 puffs into the lungs every 6 (six) hours as needed for wheezing or shortness of breath.   aspirin 81 MG chewable tablet Chew 1 tablet (81 mg total) by mouth daily. Start taking on: May 13, 2019   atorvastatin 40 MG tablet Commonly known as: LIPITOR Take 1 tablet (40 mg total) by mouth daily at 6 PM.   chlorpheniramine-HYDROcodone 10-8 MG/5ML Suer Commonly known as: Tussionex Pennkinetic ER Take 5 mLs by mouth at bedtime as needed for cough.   esomeprazole 40 MG capsule Commonly known as: NEXIUM Take 40 mg by mouth every evening.   latanoprost 0.005 % ophthalmic solution Commonly known as: XALATAN Place 1 drop into both eyes at bedtime.   losartan-hydrochlorothiazide 100-25 MG tablet Commonly known as: HYZAAR Take 0.5 tablets by mouth daily.   methylPREDNISolone 4 MG Tbpk tablet Commonly known as: MEDROL DOSEPAK follow package directions   mometasone 50 MCG/ACT nasal spray Commonly known as: NASONEX Place 2 sprays into the nose daily.   multivitamin tablet Take 1 tablet by mouth daily.   nebivolol 5 MG tablet Commonly known as: BYSTOLIC Take 1 tablet (5 mg total) by mouth daily.   sodium chloride 0.65 % Soln nasal spray Commonly known as: OCEAN Place 1 spray into both nostrils as needed for congestion.             Durable Medical Equipment  (From admission, onward)         Start     Ordered   05/11/19 1033  For home use only DME oxygen  Once    Question Answer Comment  Length of Need 6 Months   Mode or (Route) Nasal cannula   Liters per Minute 2  Frequency Continuous (stationary and portable oxygen unit needed)   Oxygen conserving device Yes   Oxygen delivery system Gas      05/11/19 1032          Follow-up Information    Lavone Orn, MD. Schedule an appointment as soon as possible for a visit in 1 week(s).   Specialty: Internal Medicine Contact information: [address], [place] [address] [phone]           Major procedures and Radiology Reports - PLEASE review detailed and final reports thoroughly  -       Ct Angio Chest Pe W Or Wo Contrast  Result Date: 05/08/2019 CLINICAL DATA:  54 year old male with chest pain and shortness of breath. EXAM: CT ANGIOGRAPHY CHEST WITH CONTRAST TECHNIQUE: Multidetector CT imaging of the chest was performed using the standard protocol during bolus administration of intravenous contrast. Multiplanar CT image reconstructions and MIPs were obtained to evaluate the vascular anatomy. CONTRAST:  137mL OMNIPAQUE IOHEXOL 350 MG/ML SOLN COMPARISON:  Chest CT dated 03/10/2019 FINDINGS: Cardiovascular: There is mild cardiomegaly. No pericardial effusion. Advanced 3 vessel coronary vascular calcification is noted. There is mild atherosclerotic calcification of the aortic arch. No aneurysm or dissection. Evaluation of the pulmonary arteries is very limited due to respiratory motion artifact and suboptimal opacification and timing of the contrast. No definite large or central pulmonary artery embolus identified. Mediastinum/Nodes: Mildly enlarged left hilar lymph node measures 14 mm. Subcarinal lymph node measures 13 mm in short axis. The esophagus and thyroid gland are grossly unremarkable. No mediastinal fluid collection.  Lungs/Pleura: Bilateral upper lobe and to a lesser degree lower lobe streaky and ground-glass densities noted most prominent involving the right upper lobe and most consistent with pneumonia, possibly atypical agent. Clinical correlation is recommended. No lobar consolidation, pleural effusion, or pneumothorax. The central airways are patent. Upper Abdomen: There is fatty infiltration of the liver. Musculoskeletal: Degenerative changes of the spine. No acute osseous pathology. Review of the MIP images confirms the above findings. IMPRESSION: 1. No CT evidence of central pulmonary artery embolus. Evaluation however is very limited due to respiratory motion artifact. 2. Bilateral airspace densities most consistent with multifocal pneumonia. Clinical correlation and follow-up to resolution recommended. 3. Mild cardiomegaly with advanced 3 vessel coronary vascular calcification. 4. Mildly enlarged left hilar and subcarinal lymph nodes, likely reactive. 5. Fatty liver. Aortic Atherosclerosis (ICD10-I70.0). Electronically Signed   By: Anner Crete M.D.   On: 05/08/2019 03:37   Dg Chest Portable 1 View  Result Date: 05/07/2019 CLINICAL DATA:  53 year old male with toe injury and shortness of breath EXAM: PORTABLE CHEST 1 VIEW COMPARISON:  March 10, 2019 FINDINGS: Cardiomediastinal silhouette unchanged in size and contour. Similar appearance of asymmetric elevation the right hemidiaphragm with low lung volumes. Coarsened interstitial markings of the bilateral lungs. No pneumothorax. No pleural effusion. No confluent airspace disease. IMPRESSION: Low lung volumes with asymmetric elevation of the right hemidiaphragm and likely atelectasis with no evidence of superimposed acute cardiopulmonary disease. Electronically Signed   By: Corrie Mckusick D.O.   On: 05/07/2019 20:51   US Abdomen Limited Ruq  Result Date: 05/08/2019 CLINICAL DATA:  Right upper quadrant pain.  COVID-19 positive EXAM: ULTRASOUND ABDOMEN LIMITED  RIGHT UPPER QUADRANT COMPARISON:  None. FINDINGS: Gallbladder: No gallstones or wall thickening visualized. There is no pericholecystic fluid. No sonographic Murphy sign noted by sonographer. Common bile duct: Diameter: 4 mm. No intrahepatic or extrahepatic biliary duct dilatation. Liver: No focal lesion identified. Liver  echogenicity is increased diffusely. Portal vein is patent on color Doppler imaging with normal direction of blood flow towards the liver. Other: None. IMPRESSION: Diffuse increase in liver echogenicity, a finding indicative of hepatic steatosis. While no focal liver lesions are evident on this study, it must be cautioned that the sensitivity of ultrasound for detection of focal liver lesions is diminished significantly in this circumstance. Study otherwise unremarkable. Electronically Signed   By: Lowella Grip III M.D.   On: 05/08/2019 07:59    Micro Results     Recent Results (from the past 240 hour(s))  Novel Coronavirus, NAA ([facility])     Status: Abnormal   Collection Time: 05/04/19 12:00 AM   Specimen: Oropharyngeal(OP) collection in vial transport medium   OROPHARYNGEA  TESTING  Result Value Ref Range Status   SARS-CoV-2, NAA Detected (A) Not Detected Final    Comment: This nucleic acid amplification test was developed and its performance characteristics determined by [employer]. Nucleic acid amplification tests include PCR and TMA. This test has not been FDA cleared or approved. This test has been authorized by FDA under an Emergency Use Authorization (EUA). This test is only authorized for the duration of time the declaration that circumstances exist justifying the authorization of the emergency use of in vitro diagnostic tests for detection of SARS-CoV-2 virus and/or diagnosis of COVID-19 infection under section 564(b)(1) of the Act, 21 U.S.C. [id number]) (1), unless the authorization is terminated or revoked sooner. When diagnostic testing is  negative, the possibility of a false negative result should be considered in the context of a patient's recent exposures and the presence of clinical signs and symptoms consistent with COVID-19. An individual without symptoms of COVID-19 and who is not shedding SARS-CoV-2 virus would  expect to have a negative (not detected) result in this assay.     Today   Subjective    Cregg Tagliaferri today has no headache,no chest abdominal pain,no new weakness tingling or numbness, feels much better wants to go home today.     Objective   Blood pressure (!) 145/88, pulse 88, temperature 98.7 F (37.1 C), temperature source Oral, resp. rate 20, height 5\' 9"  (1.753 m), weight 117.9 kg, SpO2 91 % RA.   Intake/Output Summary (Last 24 hours) at 05/12/2019 1021 Last data filed at 05/12/2019 0600 Gross per 24 hour  Intake 1000 ml  Output --  Net 1000 ml    Exam Awake Alert, Oriented x 3, No new F.N deficits, Normal affect [place].AT,PERRAL Supple Neck,No JVD, No cervical lymphadenopathy appriciated.  Symmetrical Chest wall movement, Good air movement bilaterally, CTAB RRR,No Gallops,Rubs or new Murmurs, No Parasternal Heave +ve B.Sounds, Abd Soft, Non tender, No organomegaly appriciated, No rebound -guarding or rigidity. No Cyanosis, Clubbing or edema, No new Rash or bruise   Data Review   CBC w Diff:  Lab Results  Component Value Date   WBC 4.6 05/11/2019   HGB 14.6 05/11/2019   HCT 42.5 05/11/2019   PLT 167 05/11/2019   LYMPHOPCT 17 05/11/2019   MONOPCT 12 05/11/2019   EOSPCT 0 05/11/2019   BASOPCT 0 05/11/2019    CMP:  Lab Results  Component Value Date   NA 136 05/11/2019   K 3.5 05/11/2019   CL 100 05/11/2019   CO2 21 (L) 05/11/2019   BUN 18 05/11/2019   CREATININE 0.76 05/11/2019   PROT 6.8 05/11/2019   ALBUMIN 3.9 05/11/2019   BILITOT 0.6 05/11/2019   ALKPHOS 41 05/11/2019   AST 86 (H)  05/11/2019   ALT 72 (H) 05/11/2019  .   Total Time in preparing paper work, data  evaluation and todays exam - 19 minutes  Lala Lund M.D on 05/12/2019 at 10:21 AM  [facility]   Office  [phone]

## 2019-05-12 NOTE — Discharge Instructions (Signed)
Follow with Primary MD Lavone Orn, MD in 7 days   Get CBC, CMP, 2 view Chest X ray -  checked next visit within 1 week by Primary MD   Activity: As tolerated with Full fall precautions use walker/cane & assistance as needed  Disposition Home    Diet: Heart Healthy   Special Instructions: If you have smoked or chewed Tobacco  in the last 2 yrs please stop smoking, stop any regular Alcohol  and or any Recreational drug use.  On your next visit with your primary care physician please Get Medicines reviewed and adjusted.  Please request your Prim.MD to go over all Hospital Tests and Procedure/Radiological results at the follow up, please get all Hospital records sent to your Prim MD by signing hospital release before you go home.  If you experience worsening of your admission symptoms, develop shortness of breath, life threatening emergency, suicidal or homicidal thoughts you must seek medical attention immediately by calling 911 or calling your MD immediately  if symptoms less severe.  You Must read complete instructions/literature along with all the possible adverse reactions/side effects for all the Medicines you take and that have been prescribed to you. Take any new Medicines after you have completely understood and accpet all the possible adverse reactions/side effects.      Person Under Monitoring Name: [name]  Location: [place] [address]   Infection Prevention Recommendations for Individuals Confirmed to have, or Being Evaluated for, 2019 Novel Coronavirus (COVID-19) Infection Who Receive Care at Home  Individuals who are confirmed to have, or are being evaluated for, COVID-19 should follow the prevention steps below until a healthcare provider or local or state health department says they can return to normal activities.  Stay home except to get medical care You should restrict activities outside your home, except for getting medical care. Do not go  to work, school, or public areas, and do not use public transportation or taxis.  Call ahead before visiting your doctor Before your medical appointment, call the healthcare provider and tell them that you have, or are being evaluated for, COVID-19 infection. This will help the healthcare providers office take steps to keep other people from getting infected. Ask your healthcare provider to call the local or state health department.  Monitor your symptoms Seek prompt medical attention if your illness is worsening (e.g., difficulty breathing). Before going to your medical appointment, call the healthcare provider and tell them that you have, or are being evaluated for, COVID-19 infection. Ask your healthcare provider to call the local or state health department.  Wear a facemask You should wear a facemask that covers your nose and mouth when you are in the same room with other people and when you visit a healthcare provider. People who live with or visit you should also wear a facemask while they are in the same room with you.  Separate yourself from other people in your home As much as possible, you should stay in a different room from other people in your home. Also, you should use a separate bathroom, if available.  Avoid sharing household items You should not share dishes, drinking glasses, cups, eating utensils, towels, bedding, or other items with other people in your home. After using these items, you should wash them thoroughly with soap and water.  Cover your coughs and sneezes Cover your mouth and nose with a tissue when you cough or sneeze, or you can cough or sneeze into your  sleeve. Throw used tissues in a lined trash can, and immediately wash your hands with soap and water for at least 20 seconds or use an alcohol-based hand rub.  Wash your [facility] your hands often and thoroughly with soap and water for at least 20 seconds. You can use an alcohol-based hand sanitizer  if soap and water are not available and if your hands are not visibly dirty. Avoid touching your eyes, nose, and mouth with unwashed hands.   Prevention Steps for Caregivers and Household Members of Individuals Confirmed to have, or Being Evaluated for, COVID-19 Infection Being Cared for in the Home  If you live with, or provide care at home for, a person confirmed to have, or being evaluated for, COVID-19 infection please follow these guidelines to prevent infection:  Follow healthcare providers instructions Make sure that you understand and can help the patient follow any healthcare provider instructions for all care.  Provide for the patients basic needs You should help the patient with basic needs in the home and provide support for getting groceries, prescriptions, and other personal needs.  Monitor the patients symptoms If they are getting sicker, call his or her medical provider and tell them that the patient has, or is being evaluated for, COVID-19 infection. This will help the healthcare providers office take steps to keep other people from getting infected. Ask the healthcare provider to call the local or state health department.  Limit the number of people who have contact with the patient  If possible, have only one caregiver for the patient.  Other household members should stay in another home or place of residence. If this is not possible, they should stay  in another room, or be separated from the patient as much as possible. Use a separate bathroom, if available.  Restrict visitors who do not have an essential need to be in the home.  Keep older adults, very young children, and other sick people away from the patient Keep older adults, very young children, and those who have compromised immune systems or chronic health conditions away from the patient. This includes people with chronic heart, lung, or kidney conditions, diabetes, and cancer.  Ensure good  ventilation Make sure that shared spaces in the home have good air flow, such as from an air conditioner or an opened window, weather permitting.  Wash your hands often  Wash your hands often and thoroughly with soap and water for at least 20 seconds. You can use an alcohol based hand sanitizer if soap and water are not available and if your hands are not visibly dirty.  Avoid touching your eyes, nose, and mouth with unwashed hands.  Use disposable paper towels to dry your hands. If not available, use dedicated cloth towels and replace them when they become wet.  Wear a facemask and gloves  Wear a disposable facemask at all times in the room and gloves when you touch or have contact with the patients blood, body fluids, and/or secretions or excretions, such as sweat, saliva, sputum, nasal mucus, vomit, urine, or feces.  Ensure the mask fits over your nose and mouth tightly, and do not touch it during use.  Throw out disposable facemasks and gloves after using them. Do not reuse.  Wash your hands immediately after removing your facemask and gloves.  If your personal clothing becomes contaminated, carefully remove clothing and launder. Wash your hands after handling contaminated clothing.  Place all used disposable facemasks, gloves, and other waste in a lined  container before disposing them with other household waste.  Remove gloves and wash your hands immediately after handling these items.  Do not share dishes, glasses, or other household items with the patient  Avoid sharing household items. You should not share dishes, drinking glasses, cups, eating utensils, towels, bedding, or other items with a patient who is confirmed to have, or being evaluated for, COVID-19 infection.  After the person uses these items, you should wash them thoroughly with soap and water.  Wash laundry thoroughly  Immediately remove and wash clothes or bedding that have blood, body fluids, and/or  secretions or excretions, such as sweat, saliva, sputum, nasal mucus, vomit, urine, or feces, on them.  Wear gloves when handling laundry from the patient.  Read and follow directions on labels of laundry or clothing items and detergent. In general, wash and dry with the warmest temperatures recommended on the label.  Clean all areas the individual has used often  Clean all touchable surfaces, such as counters, tabletops, doorknobs, bathroom fixtures, toilets, phones, keyboards, tablets, and bedside tables, every day. Also, clean any surfaces that may have blood, body fluids, and/or secretions or excretions on them.  Wear gloves when cleaning surfaces the patient has come in contact with.  Use a diluted bleach solution (e.g., dilute bleach with 1 part bleach and 10 parts water) or a household disinfectant with a label that says EPA-registered for coronaviruses. To make a bleach solution at home, add 1 tablespoon of bleach to 1 quart (4 cups) of water. For a larger supply, add  cup of bleach to 1 gallon (16 cups) of water.  Read labels of cleaning products and follow recommendations provided on product labels. Labels contain instructions for safe and effective use of the cleaning product including precautions you should take when applying the product, such as wearing gloves or eye protection and making sure you have good ventilation during use of the product.  Remove gloves and wash hands immediately after cleaning.  Monitor yourself for signs and symptoms of illness Caregivers and household members are considered close contacts, should monitor their health, and will be asked to limit movement outside of the home to the extent possible. Follow the monitoring steps for close contacts listed on the symptom monitoring form.   ? If you have additional questions, contact your local health department or call the epidemiologist on call at [phone] (available 24/7). ? This guidance is subject  to change. For the most up-to-date guidance from [facility], please refer to their website: [email or web address]

## 2019-05-12 NOTE — TOC Transition Note (Signed)
Transition of Care [facility]) - CM/SW Discharge Note   Patient Details  Name: [name] MRN: [id number] Date of Birth: [birth year]  Transition of Care [facility]) CM/SW Contact:  Ninfa Meeker, RN Phone Number: [phone] (working remotely) 05/12/2019, 11:40 AM   Clinical Narrative:   53 yr old male admitted and treated for COVID 19. Thankfully patient is improving and will discharge home today. Case manager spoke with patient via telephone concerning need for oxygen at home. CM explained that we will have tanks and concentrator delivered to home and then he will discharge. Patient states he called his ride when the doctor said he was discharged and will be leaving at noon. Case manager spoke with patient's nurse, Marcelino Scot and confirmed that patient would be safe traveling home with portable tank and waiting for reserve tanks to be delivered. Nurse says patient will be fine. AC has delivered portable tank to the room. CM has contacted Learta Codding with [employer] requesting home oxygen.     Final next level of care: Home/Self Care Barriers to Discharge: No Barriers Identified   Patient Goals and CMS Choice     Choice offered to / list presented to : Patient  Discharge Placement                       Discharge Plan and Services     Post Acute Care Choice: Durable Medical Equipment          DME Arranged: Oxygen DME Agency: [place] Date DME Agency Contacted: 05/12/19 Time DME Agency Contacted: 93   HH Arranged: NA          Social Determinants of Health (SDOH) Interventions     Readmission Risk Interventions No flowsheet data found.

## 2019-05-14 ENCOUNTER — Other Ambulatory Visit: Payer: Self-pay | Admitting: *Deleted

## 2019-05-14 ENCOUNTER — Telehealth: Payer: Self-pay | Admitting: Pulmonary Disease

## 2019-05-14 ENCOUNTER — Encounter: Payer: Self-pay | Admitting: *Deleted

## 2019-05-14 NOTE — Telephone Encounter (Signed)
I spoke with the pt and he verbalized understanding of the below recs per Dr Elsworth Soho  Televisit scheduled for 2 wks with Wyn Quaker Pt to call sooner if needed

## 2019-05-14 NOTE — Telephone Encounter (Signed)
I have reviewed his hospital course and see that he got remdesevir and steroids He was discharged on home oxygen and steroids He may need some more time to recover fully from this illness and that is normal for covid recovery. He can check his oxygen level with an oximeter-if he is consistently about 90% on room air then he can come off oxygen. Please arrange tele-visit with APP in 2 weeks

## 2019-05-14 NOTE — Telephone Encounter (Signed)
Spoke with the pt's spouse  Pt tested pos for covid on 05/04/2019  He was admitted to [facility] 9/24-9/29/2020  She states he is still having some SOB with exertion and is using his o2 2lpm 24/7  She is wondering if he would benefit from using neb treatments at home  He has had no more fever, no cough, aches, or other symptoms  She wants to [facility] to review his hospital stay records and advise if there is anything else they can be doing at home to help get his breathing better sooner.  Please advise, thanks!

## 2019-05-14 NOTE — Patient Outreach (Signed)
[facility]) Care Management  05/14/2019  [name] 29-Aug-1965 [id number]   Transition of care call/case closure   Referral received: 05/08/19 Initial outreach: 05/14/19 Insurance: [place] Choice Plan   Subjective: Initial successful telephone call to patient's home number in order to complete transition of care assessment; 2 HIPAA identifiers verified. Explained purpose of call and completed transition of care assessment.  [name] states he is doing OK, says he feels the best he has felt in 6 days, said he slept very well last night, says he rarely coughs and it's usually due to his dry nose from the use of his home oxygen at 2L/min. H says he is monitoring his He says he is tolerating his diet, denies bowel or bladder problems.  Spouse, [name], is assisting with his recovery.  He says he uses a Ambulance person.  He denies further educational needs related to recovering safely and keeping his family safe as he recovers from [place] 19 infection.    Objective:  [name] was hospitalized [facility] then transferred to [facility] from 9/24-9/29/2020 for positive COVID 19 infection. Comorbidities include: HTN, Asthma, OSA, GERD, kidney stones, abnormal liver function, acute pain of left knee, elevated troponin, hemoptysis, morbid obesity, smokeless tobacco use He was discharged to home on 05/12/19 with home O2 at 2L from [place].    Assessment:  Patient voices good understanding of all discharge instructions.  See transition of care flowsheet for assessment details.   Plan:  Reviewed hospital discharge diagnosis of positive COVID 19 and discharge treatment plan using hospital discharge instructions, assessing medication adherence, reviewing problems requiring provider notification, and discussing the importance of follow up with his primary care provider with CXR and labs (BMP and CBC) as directed. No ongoing care  management needs identified so will close case to [place] Management services and route successful outreach letter with [place] Management pamphlet and 24 Hour Nurse Line [place] to [place] Management clinical pool to be mailed to patient's home address.  Barrington Ellison RN,CCM,CDE [place] Management Coordinator Office Phone [phone] Office Fax [phone]

## 2019-05-14 NOTE — Patient Outreach (Signed)
[facility]) Care Management  05/14/2019  [name] 09/01/1965 [id number]   Encounter opened in incorrect context/domine.  Please see other encounter of today.  Barrington Ellison RN,CCM,CDE [place] Management Coordinator Office Phone [phone] Office Fax [phone]

## 2019-05-20 ENCOUNTER — Ambulatory Visit (INDEPENDENT_AMBULATORY_CARE_PROVIDER_SITE_OTHER): Payer: 59 | Admitting: Pulmonary Disease

## 2019-05-20 ENCOUNTER — Encounter: Payer: Self-pay | Admitting: Pulmonary Disease

## 2019-05-20 ENCOUNTER — Other Ambulatory Visit: Payer: Self-pay

## 2019-05-20 DIAGNOSIS — R918 Other nonspecific abnormal finding of lung field: Secondary | ICD-10-CM

## 2019-05-20 DIAGNOSIS — K219 Gastro-esophageal reflux disease without esophagitis: Secondary | ICD-10-CM

## 2019-05-20 DIAGNOSIS — U071 COVID-19: Secondary | ICD-10-CM | POA: Diagnosis not present

## 2019-05-20 DIAGNOSIS — Z720 Tobacco use: Secondary | ICD-10-CM

## 2019-05-20 DIAGNOSIS — G4733 Obstructive sleep apnea (adult) (pediatric): Secondary | ICD-10-CM | POA: Diagnosis not present

## 2019-05-20 DIAGNOSIS — J45991 Cough variant asthma: Secondary | ICD-10-CM | POA: Diagnosis not present

## 2019-05-20 DIAGNOSIS — J9611 Chronic respiratory failure with hypoxia: Secondary | ICD-10-CM

## 2019-05-20 HISTORY — DX: Other nonspecific abnormal finding of lung field: R91.8

## 2019-05-20 HISTORY — DX: Chronic respiratory failure with hypoxia: J96.11

## 2019-05-20 NOTE — Assessment & Plan Note (Signed)
Plan: Can consider using rescue inhaler 1-2 times a day to help with cough/wheeze

## 2019-05-20 NOTE — Assessment & Plan Note (Signed)
Plan: We continue to recommend that you do not use smokeless tobacco

## 2019-05-20 NOTE — Patient Instructions (Addendum)
You were seen today by Lauraine Rinne, NP  for:   1. COVID-19 virus infection  - DG Chest 2 View; Future  Continue to monitor your symptoms.  Notify our office if your symptoms worsen.  We will do a chest x-ray at your next office visit with [place].  Follow-up with our office in 4 weeks.  Keep follow-up with primary care this week.  Discussed with primary care potential need to see cardiologist based off of CTA chest.  2. OSA (obstructive sleep apnea)  You likely have obstructive sleep apnea.  You can continue to use your over-the-counter oral appliance but this is likely sub-therapeutic treatment.  We need to reorder a home sleep study and test you for obstructive sleep apnea.  You declined this today.  Discussed this with primary care as well as plan on discussing it with [place] in office in 4 weeks.  3. Chronic respiratory failure with hypoxia (HCC)  Continue oxygen therapy as prescribed  >>>maintain oxygen saturations greater than 88 percent  >>>if unable to maintain oxygen saturations please contact the office  >>>do not smoke with oxygen  >>>can use nasal saline gel or nasal saline rinses to moisturize nose if oxygen causes dryness   4. Cough variant asthma vs recurrent upper airway cough syndrome  Only use your albuterol as a rescue medication to be used if you can't catch your breath by resting or doing a relaxed purse lip breathing pattern.  - The less you use it, the better it will work when you need it. - Ok to use up to 2 puffs  every 4 hours if you must but call for immediate appointment if use goes up over your usual need - Don't leave home without it !!  (think of it like the spare tire for your car)   5. Gastroesophageal reflux disease, unspecified whether esophagitis present  Continue your Nexium  GERD management: >>>Avoid laying flat until 2 hours after meals >>>Elevate head of the bed including entire chest >>>Reduce size of meals and amount of fat, acid, spices,  caffeine and sweets >>>If you are smoking, Please stop! >>>Decrease alcohol consumption >>>Work on maintaining a healthy weight with normal BMI   6. Smokeless tobacco use  We continue to recommend that you stop using smokeless tobacco.  7. Abnormal findings on diagnostic imaging of lung  - DG Chest 2 View; Future  Also discussed with primary care if you need to see a cardiologist  We recommend today:  Orders Placed This Encounter  Procedures  . DG Chest 2 View    Standing Status:   Future    Standing Expiration Date:   07/19/2020    Order Specific Question:   Reason for Exam (SYMPTOM  OR DIAGNOSIS REQUIRED)    Answer:   follow up, covid19 pnu on 04/2019 CTA Chest    Order Specific Question:   Preferred imaging location?    Answer:   Internal    Order Specific Question:   Radiology Contrast Protocol - do NOT remove file path    Answer:   [email or web address]   Orders Placed This Encounter  Procedures  . DG Chest 2 View   No orders of the defined types were placed in this encounter.   Follow Up:    Return in about 4 weeks (around 06/17/2019), or if symptoms worsen or fail to improve, for Follow up with Dr. Elsworth Soho.   Please do your part to reduce the spread of COVID-19:  Reduce your risk of any infection  and COVID19 by using the similar precautions used for avoiding the common cold or flu:  Marland Kitchen Wash your hands often with soap and warm water for at least 20 seconds.  If soap and water are not readily available, use an alcohol-based hand sanitizer with at least 60% alcohol.  . If coughing or sneezing, cover your mouth and nose by coughing or sneezing into the elbow areas of your shirt or coat, into a tissue or into your sleeve (not your hands). Langley Gauss A MASK when in public  . Avoid shaking hands with others and consider head nods or verbal greetings only. . Avoid touching your eyes, nose, or mouth with unwashed hands.  . Avoid close  contact with people who are sick. . Avoid places or events with large numbers of people in one location, like concerts or sporting events. . If you have some symptoms but not all symptoms, continue to monitor at home and seek medical attention if your symptoms worsen. . If you are having a medical emergency, call 911.   [place] / e-Visit: [email or web address]         [facility]: [facility]: [id number]                   [facility]: [id number]     It is flu season:   >>> Best ways to protect herself from the flu: Receive the yearly flu vaccine, practice good hand hygiene washing with soap and also using hand sanitizer when available, eat a nutritious meals, get adequate rest, hydrate appropriately   Please contact the office if your symptoms worsen or you have concerns that you are not improving.   Thank you for choosing [facility] for your healthcare, and for allowing [place] to partner with you on your healthcare journey. I am thankful to be able to provide care to you today.   Wyn Quaker FNP-C  This information is directly available on the CDC website: RunningShows.co.za.html    Source:CDC Reference to specific commercial products, manufacturers, companies, or trademarks does not constitute its endorsement or recommendation by the [place], [place], or Centers for [employer] and Prevention.   Smokeless Tobacco Information, Adult  Tobacco is a leafy plant that contains a chemical (nicotine). Nicotine affects your brain and causes you to become addicted to it. Smokeless tobacco is tobacco that you put in your mouth instead of smoking it. It may also be called chewing tobacco or snuff. Smokeless tobacco is made from  the leaves of tobacco plants and comes in many forms, such as:  Loose, dry leaves.  Plugs or twists.  Moist pouches.  Dissolving lozenges or strips. Chewing, sucking, or holding the tobacco in your mouth causes your mouth to make more saliva. The saliva mixes with the tobacco, which you swallow or spit out. Using tobacco is harmful to your health. How can smokeless tobacco affect me? All forms of tobacco contain many chemicals that can harm every organ in the body. Using smokeless tobacco increases your risk for:  Cancer. Tobacco use is one of the leading causes of cancer. Smokeless tobacco is linked to cancer of the mouth, esophagus, and pancreas.  Other long-term health problems, including high blood pressure, heart disease, and stroke.  Addiction.  Pregnancy complications, if this applies. Pregnant women who use smokeless tobacco are more likely  to have a miscarriage or deliver a baby too early.  Mouth and dental problems, such as: ? Bad breath. ? Teeth that look yellow or brown. ? Mouth sores. ? Cracking and bleeding lips. ? Gum recession, gum disease, or tooth decay. ? Lesions on the soft tissues of your mouth (leukoplakia). The benefits of not using smokeless tobacco include:  A healthy mind and body.  Saving money. You avoid the cost of buying tobacco and the cost of treating illnesses that are caused by tobacco. What actions can I take to quit using tobacco? Ask your health care provider for help to quit using smokeless tobacco. This may involve treatment. These tips may also help you quit:  Pick a date to quit. Set a date within the next two weeks. This gives you time to prepare.  Write down the reasons why you are quitting. Keep this list in places where you will see it often, such as on your bathroom mirror or in your car or wallet.  Identify the people, places, things, and activities that make you want to use smokeless tobacco (triggers). Avoid them.  Get rid of  any tobacco you have and remove any tobacco smells. To do this: ? Throw away all containers of tobacco at home, at work, and in your car. ? Throw away any other items that you use regularly when you chew tobacco. ? Clean your car and make sure to remove all tobacco-related items. ? Clean your home, including curtains and carpets.  Tell your family and friends that you are quitting. Having support can make quitting easier.  Chew sugarless gum or sunflower seeds when you want to use smokeless tobacco.  Stay positive. Be prepared for cravings. It is common to slip up when you first quit, so take it one day at a time.  Stay busy and take care of your body. Get plenty of exercise. Drink enough water to keep your urine pale yellow.  Keep track of how many days have passed since you quit. Remembering how long and hard you have worked to quit can help you avoid using smokeless tobacco again. Where to find support Ask your health care provider if there is a local support group for quitting smokeless tobacco. Where to find more information You can learn more about the risks of using smokeless tobacco and the benefits of quitting from these sources:  [place]: www.cancer.[place]: www.cancer.gov  Centers for Disease Control and Prevention: http://www.wolf.info/ Contact a health care provider if you have:  Trouble quitting smokeless tobacco use on your own.  White or other discolored patches in your mouth.  Difficulty swallowing.  A change in your voice.  Unexplained weight loss.  Stomach pain, nausea, or vomiting. Summary  Smokeless tobacco contains many different chemicals that are known to cause cancer.  Nicotine is an addictive chemical in smokeless tobacco that affects your brain.  The benefits of not using smokeless tobacco include having a healthy mind and body and saving money.  Tell your family and friends that you are quitting. Having support can  make quitting easier.  Ask your health care provider for help quitting smokeless tobacco. This may involve treatment. This information is not intended to replace advice given to you by your health care provider. Make sure you discuss any questions you have with your health care provider. Document Released: 01/01/2011 Document Revised: 10/06/2018 Document Reviewed: 10/06/2018 Elsevier Patient Education  [place].   Gastroesophageal Reflux Disease, Adult Gastroesophageal  reflux (GER) happens when acid from the stomach flows up into the tube that connects the mouth and the stomach (esophagus). Normally, food travels down the esophagus and stays in the stomach to be digested. With GER, food and stomach acid sometimes move back up into the esophagus. You may have a disease called gastroesophageal reflux disease (GERD) if the reflux:  Happens often.  Causes frequent or very bad symptoms.  Causes problems such as damage to the esophagus. When this happens, the esophagus becomes sore and swollen (inflamed). Over time, GERD can make small holes (ulcers) in the lining of the esophagus. What are the causes? This condition is caused by a problem with the muscle between the esophagus and the stomach. When this muscle is weak or not normal, it does not close properly to keep food and acid from coming back up from the stomach. The muscle can be weak because of:  Tobacco use.  Pregnancy.  Having a certain type of hernia (hiatal hernia).  Alcohol use.  Certain foods and drinks, such as coffee, chocolate, onions, and peppermint. What increases the risk? You are more likely to develop this condition if you:  Are overweight.  Have a disease that affects your connective tissue.  Use NSAID medicines. What are the signs or symptoms? Symptoms of this condition include:  Heartburn.  Difficult or painful swallowing.  The feeling of having a lump in the throat.  A bitter taste in the  mouth.  Bad breath.  Having a lot of saliva.  Having an upset or bloated stomach.  Belching.  Chest pain. Different conditions can cause chest pain. Make sure you see your doctor if you have chest pain.  Shortness of breath or noisy breathing (wheezing).  Ongoing (chronic) cough or a cough at night.  Wearing away of the surface of teeth (tooth enamel).  Weight loss. How is this treated? Treatment will depend on how bad your symptoms are. Your doctor may suggest:  Changes to your diet.  Medicine.  Surgery. Follow these instructions at home: Eating and drinking   Follow a diet as told by your doctor. You may need to avoid foods and drinks such as: ? Coffee and tea (with or without caffeine). ? Drinks that contain alcohol. ? Energy drinks and sports drinks. ? Bubbly (carbonated) drinks or sodas. ? Chocolate and cocoa. ? Peppermint and mint flavorings. ? Garlic and onions. ? Horseradish. ? Spicy and acidic foods. These include peppers, chili powder, curry powder, vinegar, hot sauces, and BBQ sauce. ? Citrus fruit juices and citrus fruits, such as oranges, lemons, and limes. ? Tomato-based foods. These include red sauce, chili, salsa, and pizza with red sauce. ? Fried and fatty foods. These include donuts, french fries, potato chips, and high-fat dressings. ? High-fat meats. These include hot dogs, rib eye steak, sausage, ham, and bacon. ? High-fat dairy items, such as whole milk, butter, and cream cheese.  Eat small meals often. Avoid eating large meals.  Avoid drinking large amounts of liquid with your meals.  Avoid eating meals during the 2-3 hours before bedtime.  Avoid lying down right after you eat.  Do not exercise right after you eat. Lifestyle   Do not use any products that contain nicotine or tobacco. These include cigarettes, e-cigarettes, and chewing tobacco. If you need help quitting, ask your doctor.  Try to lower your stress. If you need help  doing this, ask your doctor.  If you are overweight, lose an amount of weight that is  healthy for you. Ask your doctor about a safe weight loss goal. General instructions  Pay attention to any changes in your symptoms.  Take over-the-counter and prescription medicines only as told by your doctor. Do not take aspirin, ibuprofen, or other NSAIDs unless your doctor says it is okay.  Wear loose clothes. Do not wear anything tight around your waist.  Raise (elevate) the head of your bed about 6 inches (15 cm).  Avoid bending over if this makes your symptoms worse.  Keep all follow-up visits as told by your doctor. This is important. Contact a doctor if:  You have new symptoms.  You lose weight and you do not know why.  You have trouble swallowing or it hurts to swallow.  You have wheezing or a cough that keeps happening.  Your symptoms do not get better with treatment.  You have a hoarse voice. Get help right away if:  You have pain in your arms, neck, jaw, teeth, or back.  You feel sweaty, dizzy, or light-headed.  You have chest pain or shortness of breath.  You throw up (vomit) and your throw-up looks like blood or coffee grounds.  You pass out (faint).  Your poop (stool) is bloody or black.  You cannot swallow, drink, or eat. Summary  If a person has gastroesophageal reflux disease (GERD), food and stomach acid move back up into the esophagus and cause symptoms or problems such as damage to the esophagus.  Treatment will depend on how bad your symptoms are.  Follow a diet as told by your doctor.  Take all medicines only as told by your doctor. This information is not intended to replace advice given to you by your health care provider. Make sure you discuss any questions you have with your health care provider. Document Released: 01/16/2008 Document Revised: 02/05/2018 Document Reviewed: 02/05/2018 Elsevier Patient Education  2020 [place] for Gastroesophageal Reflux Disease, Adult When you have gastroesophageal reflux disease (GERD), the foods you eat and your eating habits are very important. Choosing the right foods can help ease your discomfort. Think about working with a nutrition specialist (dietitian) to help you make good choices. What are tips for following this plan?  Meals  Choose healthy foods that are low in fat, such as fruits, vegetables, whole grains, low-fat dairy products, and lean meat, fish, and poultry.  Eat small meals often instead of 3 large meals a day. Eat your meals slowly, and in a place where you are relaxed. Avoid bending over or lying down until 2-3 hours after eating.  Avoid eating meals 2-3 hours before bed.  Avoid drinking a lot of liquid with meals.  Cook foods using methods other than frying. Bake, grill, or broil food instead.  Avoid or limit: ? Chocolate. ? Peppermint or spearmint. ? Alcohol. ? Pepper. ? Black and decaffeinated coffee. ? Black and decaffeinated tea. ? Bubbly (carbonated) soft drinks. ? Caffeinated energy drinks and soft drinks.  Limit high-fat foods such as: ? Fatty meat or fried foods. ? Whole milk, cream, butter, or ice cream. ? Nuts and nut butters. ? Pastries, donuts, and sweets made with butter or shortening.  Avoid foods that cause symptoms. These foods may be different for everyone. Common foods that cause symptoms include: ? Tomatoes. ? Oranges, lemons, and limes. ? Peppers. ? Spicy food. ? Onions and garlic. ? Vinegar. Lifestyle  Maintain a healthy weight. Ask your doctor what weight is healthy for you. If you  need to lose weight, work with your doctor to do so safely.  Exercise for at least 30 minutes for 5 or more days each week, or as told by your doctor.  Wear loose-fitting clothes.  Do not smoke. If you need help quitting, ask your doctor.  Sleep with the head of your bed higher than your feet. Use a wedge under the mattress or  blocks under the bed frame to raise the head of the bed. Summary  When you have gastroesophageal reflux disease (GERD), food and lifestyle choices are very important in easing your symptoms.  Eat small meals often instead of 3 large meals a day. Eat your meals slowly, and in a place where you are relaxed.  Limit high-fat foods such as fatty meat or fried foods.  Avoid bending over or lying down until 2-3 hours after eating.  Avoid peppermint and spearmint, caffeine, alcohol, and chocolate. This information is not intended to replace advice given to you by your health care provider. Make sure you discuss any questions you have with your health care provider. Document Released: 01/29/2012 Document Revised: 11/20/2018 Document Reviewed: 09/04/2016 Elsevier Patient Education  2020 [employer].

## 2019-05-20 NOTE — Assessment & Plan Note (Signed)
Plan: Advanced three-vessel coronary vascular calcifications on CTA chest in September/2020, discuss cardiology referral with primary care at follow-up visit as indicated on discharge summary  Chest x-ray to be completed at next office visit

## 2019-05-20 NOTE — Assessment & Plan Note (Addendum)
Plan:  Continue to use oxygen as ordered  Maintain o2 sats above 90 percent at all times Okay to attempt taking oxygen off when sitting and checking oxygen levels on room air.  If oxygen levels are above 90% on room air then okay to leave oxygen off, always wear oxygen with physical exertion Will need walk in office at next office visit Continue ayr nasal saline gel

## 2019-05-20 NOTE — Progress Notes (Signed)
Virtual Visit via Telephone Note  I connected with@ on 05/20/19 at  9:00 AM EDT by telephone and verified that I am speaking with the correct person using two identifiers.  Location: Patient: Home Provider: [facility] - [name], [name], [place], [place] [address]   I discussed the limitations, risks, security and privacy concerns of performing an evaluation and management service by telephone and the availability of in person appointments. I also discussed with the patient that there may be a patient responsible charge related to this service. The patient expressed understanding and agreed to proceed.  Patient consented to consult via telephone: Yes People present and their role in pt care: Pt     History of Present Illness:  53 year old male former smoker, current smokeless tobacco user followed in our office for upper airway cough syndrome.  Patient tested positive for SARS-CoV-2 on 05/04/2019.  Patient was admitted and hospitalized for COVID-19 pneumonia on 05/07/2019.  Patient was discharged on 05/12/2019.  PMH: GERD, hypertension, asthma, kidney stones, history of hemoptysis, morbid obesity, smokeless tobacco use Smoker/ Smoking History: Former smoker.  Quit 1985. Current smokeless tobacco.  Maintenance: None Pt of: Dr. Elsworth Soho  Chief complaint: Tele-visit for hospital follow-up, recent COVID-68 diagnosis  53 year old male former smoker.  Current smokeless tobacco user completing a tele-visit with our office today.  Patient is followed in our office for upper airway cough syndrome as well as sinobronchitis.  Patient was diagnosed with SARS-CoV-2 on 05/04/2019.  This resulted in a COVID-19 pneumonia which required the patient to be hospitalized on 05/07/2019.  Patient was discharged home on 05/12/2019.  Patient was encouraged at that time to have outpatient follow-up with cardiology as well as to consider repeating outpatient sleep study.  Patient received IV redundance  of year as well as dexamethasone.  He was discharged home on a steroid taper as well as an as needed albuterol inhaler.  Patient contacted our office on 05/14/2019 notifying us that he is still using his home oxygen 2 L 24/7.  He is still having shortness of breath with exertion.  He denied having any fevers, cough or body aches or other COVID-like symptoms.   Overall patient reporting today that he feels that he is getting better slowly.  He is continuing to wear his oxygen at home.  He has not had to use his rescue inhaler in the last 3 to 4 days.  He is using his incentive spirometer but every couple of hours to help with improving his breathing.  He is averaging incentive spirometer readings of around 1500.  He has an upcoming primary care appointment on 05/22/2019 at 1:30 PM.  He admits he has been tested for obstructive sleep apnea in the past.  He never started CPAP therapy due to insurance coverage.  He does not remember the severity of his sleep apnea.  He reports that he is using an over-the-counter oral appliance.  He is interested in discussing obstructive sleep apnea management with [place] in person as well as with primary care.  He declined an home sleep study ordered to be placed today.  He reports his oxygen levels have been stable on 2 L home O2.  Averaging around 92 to 96%.  He has not yet tested to see what his oxygen levels would be like on room air.  Observations/Objective:  05/20/2019 - Sp02 - 92-96 with 2L home o2   05/04/2019- SARS-CoV-2-positive  07/02/2016-CBC with differential- eosinophils relative 1.3, eosinophils absolute 0.1  07/02/2016-respiratory  allergy panel- negative for allergy elevations, IgE 22  09/27/2017-echocardiogram-LV ejection fraction 55 to 60%, mild LVH, grade 1 diastolic dysfunction  [id number] 16.2  03/10/2019-CTA chest- examination of PE was limited by breath and motion, particularly in lung bases.  Within this limitation no evidence of PE  through the proximal segmental pulmonary arterial level, CAD  05/08/2019-CTA chest- no evidence of PE, bilateral airspace densities most consistent with multifocal pneumonia, cardiomegaly and advanced three-vessel coronary vascular calcification, mildly enlarged left hilar and subcarinal lymph nodes  05/08/2019-echocardiogram- LV ejection fraction 70 to 75%, no LV hypertrophy, impaired relaxation of LV diastolic filling, right ventricle has normal systolic function,  Assessment and Plan:  OSA (obstructive sleep apnea) Plan:  Offered home sleep study, pt declines at this time  Patient would like to discuss this with primary care as well as discuss this with [place] in person at next office visit   COVID-19 virus infection Clinically seems to be improving since hospital discharge Patient denies fevers, body aches, chills, worsened respiratory symptoms Patient still maintained on home oxygen of 2 L 24/7, still having shortness of breath and some occasional cough  Plan:  Chest Xray at next ov  Continue to monitor symptoms  Notify our office if symptoms worsen Complete upcoming primary care follow-up  Chronic respiratory failure with hypoxia ([place]) Plan:  Continue to use oxygen as ordered  Maintain o2 sats above 90 percent at all times Okay to attempt taking oxygen off when sitting and checking oxygen levels on room air.  If oxygen levels are above 90% on room air then okay to leave oxygen off, always wear oxygen with physical exertion Will need walk in office at next office visit Continue ayr nasal saline gel  Cough variant asthma vs recurrent upper airway cough syndrome Plan: Can consider using rescue inhaler 1-2 times a day to help with cough/wheeze  GERD (gastroesophageal reflux disease) Plan: Continue to work diligently on diet for acid reflux Continue Nexium  Smokeless tobacco use Plan: We continue to recommend that you do not use smokeless tobacco  Abnormal findings on  diagnostic imaging of lung Plan: Advanced three-vessel coronary vascular calcifications on CTA chest in September/2020, discuss cardiology referral with primary care at follow-up visit as indicated on discharge summary  Chest x-ray to be completed at next office visit   Follow Up Instructions:  Return in about 4 weeks (around 06/17/2019), or if symptoms worsen or fail to improve, for Follow up with Dr. Elsworth Soho.   I discussed the assessment and treatment plan with the patient. The patient was provided an opportunity to ask questions and all were answered. The patient agreed with the plan and demonstrated an understanding of the instructions.   The patient was advised to call back or seek an in-person evaluation if the symptoms worsen or if the condition fails to improve as anticipated.  I provided 24 minutes of non-face-to-face time during this encounter.   Lauraine Rinne, NP

## 2019-05-20 NOTE — Assessment & Plan Note (Addendum)
Plan:  Offered home sleep study, pt declines at this time  Patient would like to discuss this with primary care as well as discuss this with [place] in person at next office visit

## 2019-05-20 NOTE — Assessment & Plan Note (Addendum)
Clinically seems to be improving since hospital discharge Patient denies fevers, body aches, chills, worsened respiratory symptoms Patient still maintained on home oxygen of 2 L 24/7, still having shortness of breath and some occasional cough  Plan:  Chest Xray at next ov  Continue to monitor symptoms  Notify our office if symptoms worsen Complete upcoming primary care follow-up

## 2019-05-20 NOTE — Assessment & Plan Note (Signed)
Plan: Continue to work diligently on diet for acid reflux Continue Nexium

## 2019-05-22 ENCOUNTER — Other Ambulatory Visit: Payer: Self-pay | Admitting: Internal Medicine

## 2019-05-22 ENCOUNTER — Ambulatory Visit
Admission: RE | Admit: 2019-05-22 | Discharge: 2019-05-22 | Disposition: A | Discharge status: Home / Self Care | Payer: 59 | Source: Ambulatory Visit | Attending: Internal Medicine | Admitting: Internal Medicine

## 2019-05-22 DIAGNOSIS — U071 COVID-19: Secondary | ICD-10-CM

## 2019-05-28 ENCOUNTER — Ambulatory Visit: Payer: 59 | Admitting: Pulmonary Disease

## 2019-06-10 ENCOUNTER — Ambulatory Visit (INDEPENDENT_AMBULATORY_CARE_PROVIDER_SITE_OTHER): Payer: 59

## 2019-06-10 ENCOUNTER — Other Ambulatory Visit: Payer: Self-pay

## 2019-06-10 ENCOUNTER — Encounter: Payer: Self-pay | Admitting: *Deleted

## 2019-06-10 ENCOUNTER — Encounter: Payer: Self-pay | Admitting: Pulmonary Disease

## 2019-06-10 ENCOUNTER — Ambulatory Visit (INDEPENDENT_AMBULATORY_CARE_PROVIDER_SITE_OTHER): Payer: 59 | Admitting: Pulmonary Disease

## 2019-06-10 DIAGNOSIS — J9611 Chronic respiratory failure with hypoxia: Secondary | ICD-10-CM

## 2019-06-10 DIAGNOSIS — G4733 Obstructive sleep apnea (adult) (pediatric): Secondary | ICD-10-CM | POA: Diagnosis not present

## 2019-06-10 DIAGNOSIS — U071 COVID-19: Secondary | ICD-10-CM

## 2019-06-10 DIAGNOSIS — R918 Other nonspecific abnormal finding of lung field: Secondary | ICD-10-CM

## 2019-06-10 NOTE — Assessment & Plan Note (Signed)
He will continue nocturnal oxygen use until we decide whether he needs oral appliance or CPAP

## 2019-06-10 NOTE — Assessment & Plan Note (Signed)
He appears to be significantly improved, has come off oxygen in the daytime.  Did not desaturate on ambulation today.  He can return to work, no limitations We will repeat chest x-ray in 3 months to look for resolution of infiltrates. We discussed symptoms of long Covid

## 2019-06-10 NOTE — Assessment & Plan Note (Signed)
I will obtain and review of studies from Dr. Ardelle Park he only has mild OSA, then he can try and persist with his oral appliance. If he has moderate OSA then would suggest trial of CPAP with nasal pillows plus or minus chinstrap-alternatively we can use [employer] fullface interface

## 2019-06-10 NOTE — Progress Notes (Signed)
   Subjective:    Patient ID: [name], male    DOB: October 24, 1965, 53 y.o.   MRN: [id number]  HPI  53 yo ex - smoker with upper airway cough syndrome -Attacks of sinobronchitis at least once a year He also has a history of OSA and did not tolerate CPAP therapy in the past He has GERD and stays on Nexium  Last such episode was in 2017  He was diagnosed with SARS-CoV-2 on 05/04/2019.    He was admitted for Covid pneumonia 9/24-29, received remdesivir and steroids, was discharged on oxygen. He now feels more than 50% improved has come off daytime oxygen and continues to use nocturnal O2.  On ambulation, oxygen saturation dropped to 93%.  Chest x-ray today was personally reviewed which shows bilateral chronic appearing infiltrates, improved from prior chest x-ray from September. CT angiogram was also reviewed from September  He also needs advice about his OSA.  Apparently he did not tolerate CPAP in the past, used a full facemask, wonders if he would do better with nasal pillows.  During his hospital admission he was noted to have severe apneas and he was advised to get this addressed. He had a dental evaluation by Dr. Marice Potter in 05/2018 and was fitted with an oral appliance he will use this for a few months until his jaw started hurting and and stopped using this. He has lost significant weight from 266 down to his current weight of 235 pounds. He does report significant snoring and witnessed apneas even when he was as low as 200 pounds    Significant tests/ events   05/08/2019-CTA chest- no evidence of PE, bilateral airspace densities most consistent with multifocal pneumonia, cardiomegaly and advanced three-vessel coronary vascular calcification, mildly enlarged left hilar and subcarinal lymph nodes  05/08/2019-echocardiogram- LV ejection fraction 70 to 75%, no LV hypertrophy, impaired relaxation of LV diastolic filling, right ventricle has normal systolic function,   Spirometry 10/2013 showed mild restriction with normal ratio, FEV1 73% -2.87, FVC 3.59 -71%   Past Medical History:  Diagnosis Date  . Asthma    Dr. Laurann Montana  . COVID-19 virus infection 05/07/2019  . Hypertension   . Kidney stones   . OSA (obstructive sleep apnea)   . Renal disorder      Review of Systems neg for any significant sore throat, dysphagia, itching, sneezing, nasal congestion or excess/ purulent secretions, fever, chills, sweats, unintended wt loss, pleuritic or exertional cp, hempoptysis, orthopnea pnd or change in chronic leg swelling. Also denies presyncope, palpitations, heartburn, abdominal pain, nausea, vomiting, diarrhea or change in bowel or urinary habits, dysuria,hematuria, rash, arthralgias, visual complaints, headache, numbness weakness or ataxia.     Objective:   Physical Exam  Gen. Pleasant, well-nourished, in no distress, normal affect ENT - no pallor,icterus, no post nasal drip Neck: No JVD, no thyromegaly, no carotid bruits Lungs: no use of accessory muscles, no dullness to percussion, clear without rales or rhonchi  Cardiovascular: Rhythm regular, heart sounds  normal, no murmurs or gallops, no peripheral edema Abdomen: soft and non-tender, no hepatosplenomegaly, BS normal. Musculoskeletal: No deformities, no cyanosis or clubbing Neuro:  alert, non focal       Assessment & Plan:

## 2019-06-10 NOTE — Patient Instructions (Signed)
Continue using nocturnal oxygen.  Okay to return to work  We will obtain sleep studies from Dr. Toy Cookey and advise you regarding oral appliance versus CPAP

## 2019-06-11 NOTE — Progress Notes (Signed)
Noted.  Please disregard my suggestion.  Okay for January follow-up.  We will continue to monitor the patient clinically.  If he starts to have worsening shortness of breath or increased oxygen needs he can contact her office.Wyn Quaker, FNP

## 2019-06-11 NOTE — Progress Notes (Signed)
Chest x-ray still showing persistent bilateral opacities.  This is likely still due to the resolving COVID-19 pneumonia.  Clinically how are you doing?  He having any worsening symptoms?  Or do you feel like you are still progressing in the right direction?  We will continue to monitor you and your symptoms.  Cherina can you look to see if the patient get scheduled with Dr. Elsworth Soho in December?  Wyn Quaker FNP

## 2019-06-12 ENCOUNTER — Ambulatory Visit: Payer: 59 | Admitting: Pulmonary Disease

## 2019-06-30 ENCOUNTER — Telehealth: Payer: Self-pay | Admitting: Pulmonary Disease

## 2019-06-30 DIAGNOSIS — G4733 Obstructive sleep apnea (adult) (pediatric): Secondary | ICD-10-CM

## 2019-06-30 NOTE — Telephone Encounter (Signed)
Call returned to patient wife [name] (dpr), she states Dr. Arie Sabina him to use his mouth piece during the sleep study. She states she thinks RA wanted him to do a HST. I made her aware I did not see that it was specified in the order so I would send the message to the app of the day and get the order sent in. Voiced understanding.   Verbal given from NP Wyn Quaker okay to order HST. Order placed.   Will route message to Dr. Elsworth Soho as Juluis Rainier.   Nothing further needed at this time.

## 2019-07-16 ENCOUNTER — Telehealth: Payer: Self-pay | Admitting: Pulmonary Disease

## 2019-07-16 DIAGNOSIS — G4733 Obstructive sleep apnea (adult) (pediatric): Secondary | ICD-10-CM

## 2019-07-16 NOTE — Telephone Encounter (Signed)
Reviewed HST performed by Dr. Toy Cookey 10/07/2018 and 10/08/2018 showing AHI of 39/hour and 26/hour with weight 260 pounds  His weight on his last visit had dropped to 235 pounds So he needs a repeat home sleep test. -How to do the test depends on whether he wants to continue using oral appliance or not  If he was tolerating oral appliance, then would suggest repeating HST on oral appliance.  If he would like to get back to CPAP then we should do HST without

## 2019-07-16 NOTE — Telephone Encounter (Signed)
Called and spoke with pt letting him know the info stated by RA. Asked pt if he would like to repeat the HST using the oral appliance or if he would like to get back on cpap and pt said he is tolerating the oral appliance fine so would like to repeat the HST on that.  Order placed for the HST to be performed while wearing the oral appliance. Nothing further needed.

## 2019-09-10 ENCOUNTER — Other Ambulatory Visit: Payer: Self-pay

## 2019-09-10 ENCOUNTER — Ambulatory Visit (INDEPENDENT_AMBULATORY_CARE_PROVIDER_SITE_OTHER): Payer: 59 | Admitting: Pulmonary Disease

## 2019-09-10 ENCOUNTER — Encounter: Payer: Self-pay | Admitting: Pulmonary Disease

## 2019-09-10 ENCOUNTER — Ambulatory Visit: Payer: 59

## 2019-09-10 VITALS — BP 138/68 | HR 67 | Temp 97.3°F | Ht 69.0 in | Wt 236.6 lb

## 2019-09-10 DIAGNOSIS — G4733 Obstructive sleep apnea (adult) (pediatric): Secondary | ICD-10-CM

## 2019-09-10 NOTE — Patient Instructions (Addendum)
You were seen today by Lauraine Rinne, NP  for:   1. OSA (obstructive sleep apnea)  Continue to use oral appliance when sleeping  Complete home sleep study as planned tonight 09/10/2019  Return home sleep study equipment on 09/11/2019  Once we receive those results we can discuss continuing on oral appliance therapy or if we need to consider trial of CPAP therapy   Follow Up:    Return in about 6 months (around 03/09/2020), or if symptoms worsen or fail to improve, for Follow up with Dr. Elsworth Soho.   Please do your part to reduce the spread of COVID-19:      Reduce your risk of any infection  and COVID19 by using the similar precautions used for avoiding the common cold or flu:  Marland Kitchen Wash your hands often with soap and warm water for at least 20 seconds.  If soap and water are not readily available, use an alcohol-based hand sanitizer with at least 60% alcohol.  . If coughing or sneezing, cover your mouth and nose by coughing or sneezing into the elbow areas of your shirt or coat, into a tissue or into your sleeve (not your hands). Langley Gauss A MASK when in public  . Avoid shaking hands with others and consider head nods or verbal greetings only. . Avoid touching your eyes, nose, or mouth with unwashed hands.  . Avoid close contact with people who are sick. . Avoid places or events with large numbers of people in one location, like concerts or sporting events. . If you have some symptoms but not all symptoms, continue to monitor at home and seek medical attention if your symptoms worsen. . If you are having a medical emergency, call 911.   [place] / e-Visit: [email or web address]         [facility]: [facility]: [id number]                   [facility]: [id number]     It is flu season:   >>> Best ways to protect herself from the  flu: Receive the yearly flu vaccine, practice good hand hygiene washing with soap and also using hand sanitizer when available, eat a nutritious meals, get adequate rest, hydrate appropriately   Please contact the office if your symptoms worsen or you have concerns that you are not improving.   Thank you for choosing [facility] for your healthcare, and for allowing [place] to partner with you on your healthcare journey. I am thankful to be able to provide care to you today.   Wyn Quaker FNP-C

## 2019-09-10 NOTE — Progress Notes (Signed)
@Patient  ID: [name], male    DOB: [birth year], 54 y.o.   MRN: [id number]  Chief Complaint  Patient presents with  . Follow-up    no symtoms    Referring provider: Lavone Orn, MD  HPI:   54 year old male former smoker, current smokeless tobacco user followed in our office for upper airway cough syndrome.  Patient tested positive for SARS-CoV-2 on 05/04/2019.  Patient was admitted and hospitalized for COVID-19 pneumonia on 05/07/2019.  Patient was discharged on 05/12/2019.  Patient also followed for obstructive sleep apnea.  Currently using oral appliance from Dr. Toy Cookey.  PMH: GERD, hypertension, asthma, kidney stones, history of hemoptysis, morbid obesity, smokeless tobacco use Smoker/ Smoking History: Former smoker.  Quit 1985. Current smokeless tobacco.  Maintenance: None Pt of: Dr. Elsworth Soho  09/10/2019  - Visit   54 year old male former smoker followed in our office for obstructive sleep apnea.  He is currently using oral appliance from Dr. Toy Cookey.  He is presenting today to receive home sleep study equipment to be tested with oral appliance.  Patient has had weight loss down to 235 pounds.  Previously tested at 260.  Patient is interested in considering CPAP therapy if he is still having uncontrolled apneas despite oral appliance use.  Overall patient reports that he has been doing well since last being seen in office.  Tests:   05/04/2019- SARS-CoV-2-positive  07/02/2016-CBC with differential- eosinophils relative 1.3, eosinophils absolute 0.1  07/02/2016-respiratory allergy panel- negative for allergy elevations, IgE 22  09/27/2017-echocardiogram-LV ejection fraction 55 to 60%, mild LVH, grade 1 diastolic dysfunction  [id number] chest- examination of PE was limited by breath and motion, particularly in lung bases.  Within this limitation no evidence of PE through the proximal segmental pulmonary arterial level, CAD  05/08/2019-CTA chest- no evidence of PE, bilateral  airspace densities most consistent with multifocal pneumonia, cardiomegaly and advanced three-vessel coronary vascular calcification, mildly enlarged left hilar and subcarinal lymph nodes  05/08/2019-echocardiogram- LV ejection fraction 70 to 75%, no LV hypertrophy, impaired relaxation of LV diastolic filling, right ventricle has normal systolic function,  FENO:  No results found for: NITRICOXIDE  PFT: No flowsheet data found.  WALK:  SIX MIN WALK 06/10/2019  Supplimental Oxygen during Test? (L/min) No  Tech Comments: patient tolerated walk well. no shortness of breath , dizziness, pt walked at moderate pace.    Imaging: No results found.  Lab Results:  CBC    Component Value Date/Time   WBC 4.6 05/11/2019 0240   RBC 4.81 05/11/2019 0240   HGB 14.6 05/11/2019 0240   HCT 42.5 05/11/2019 0240   PLT 167 05/11/2019 0240   MCV 88.4 05/11/2019 0240   MCH 30.4 05/11/2019 0240   MCHC 34.4 05/11/2019 0240   RDW 13.2 05/11/2019 0240   LYMPHSABS 0.8 05/11/2019 0240   MONOABS 0.6 05/11/2019 0240   EOSABS 0.0 05/11/2019 0240   BASOSABS 0.0 05/11/2019 0240    BMET    Component Value Date/Time   NA 136 05/11/2019 0240   K 3.5 05/11/2019 0240   CL 100 05/11/2019 0240   CO2 21 (L) 05/11/2019 0240   GLUCOSE 129 (H) 05/11/2019 0240   BUN 18 05/11/2019 0240   CREATININE 0.76 05/11/2019 0240   CALCIUM 8.4 (L) 05/11/2019 0240   GFRNONAA >60 05/11/2019 0240   GFRAA >60 05/11/2019 0240    BNP    Component Value Date/Time   BNP 14.0 05/07/2019 1924    ProBNP No results found for: PROBNP  Specialty Problems  Pulmonary Problems   Cough variant asthma vs recurrent upper airway cough syndrome    post bronchitic or related to reflux -  07/26/2014 p extensive coaching HFA effectiveness =    90%  - Allergy profile 07/02/16  >  Eos 0.1 /  IgE 22  RAST neg        Sinobronchitis   Hemoptysis    03/10/2019-CBC-hemoglobin 16.2  03/10/2019-CTA chest- examination of PE was  limited by breath and motion, particularly in lung bases.  Within this limitation no evidence of PE through the proximal segmental pulmonary arterial level, CAD       Chronic respiratory failure with hypoxia (HCC)   OSA (obstructive sleep apnea)    Self reported OSA, uses otc mouth piece         Allergies  Allergen Reactions  . Neosporin [Neomycin-Bacitracin Zn-Polymyx]     unknown  . Penicillins     Did it involve swelling of the face/tongue/throat, SOB, or low BP? Unknown Did it involve sudden or severe rash/hives, skin peeling, or any reaction on the inside of your mouth or nose? Unknown Did you need to seek medical attention at a hospital or doctor's office? Unknown When did it last happen?Childhood  If all above answers are "NO", may proceed with cephalosporin use.  Unknown reaction  . Tramadol Hcl Hives and Itching    Immunization History  Administered Date(s) Administered  . Influenza Split 05/13/2013, 05/13/2017, 05/15/2018  . Influenza,inj,Quad PF,6+ Mos 05/19/2015, 05/13/2016, 05/12/2019  . Influenza-Unspecified 06/01/2014    Past Medical History:  Diagnosis Date  . Asthma    Dr. Laurann Montana  . COVID-19 virus infection 05/07/2019  . Hypertension   . Kidney stones   . OSA (obstructive sleep apnea)   . Renal disorder     Tobacco History: Social History   Tobacco Use  Smoking Status Former Smoker  . Packs/day: 1.00  . Years: 1.00  . Pack years: 1.00  . Types: Cigarettes  . Quit date: 08/14/1983  . Years since quitting: 36.0  Smokeless Tobacco Current User  . Types: Chew  Tobacco Comment   dip   Ready to quit: Yes Counseling given: Yes Comment: dip   Continue to not smoke  Outpatient Encounter Medications as of 09/10/2019  Medication Sig  . albuterol (PROAIR HFA) 108 (90 Base) MCG/ACT inhaler Inhale 2 puffs into the lungs every 6 (six) hours as needed for wheezing or shortness of breath.  Marland Kitchen aspirin 81 MG chewable tablet Chew 1 tablet (81 mg  total) by mouth daily.  Marland Kitchen esomeprazole (NEXIUM) 40 MG capsule Take 40 mg by mouth every evening.   . latanoprost (XALATAN) 0.005 % ophthalmic solution Place 1 drop into both eyes at bedtime.   Marland Kitchen losartan-hydrochlorothiazide (HYZAAR) 100-25 MG tablet Take 0.5 tablets by mouth daily.  . mometasone (NASONEX) 50 MCG/ACT nasal spray Place 2 sprays into the nose daily.  . Multiple Vitamin (MULTIVITAMIN) tablet Take 1 tablet by mouth daily.  . Multiple Vitamins-Minerals (VITAMIN D3 COMPLETE PO) Take 1 tablet by mouth daily. Every other day  . sodium chloride (OCEAN) 0.65 % SOLN nasal spray Place 1 spray into both nostrils as needed for congestion.  Marland Kitchen zinc gluconate 50 MG tablet Take 50 mg by mouth every other day.  . [DISCONTINUED] methylPREDNISolone (MEDROL DOSEPAK) 4 MG TBPK tablet follow package directions   No facility-administered encounter medications on file as of 09/10/2019.     Review of Systems  Review of Systems  Constitutional: Negative for activity change,  chills, fatigue, fever and unexpected weight change.  HENT: Negative for postnasal drip, rhinorrhea, sinus pressure, sinus pain and sore throat.   Eyes: Negative.   Respiratory: Negative for cough, shortness of breath and wheezing.   Cardiovascular: Negative for chest pain and palpitations.  Gastrointestinal: Negative for constipation, diarrhea, nausea and vomiting.  Endocrine: Negative.   Genitourinary: Negative.   Musculoskeletal: Negative.   Skin: Negative.   Neurological: Negative for dizziness and headaches.  Psychiatric/Behavioral: Negative.  Negative for dysphoric mood. The patient is not nervous/anxious.   All other systems reviewed and are negative.    Physical Exam  BP 138/68 (BP Location: Left Arm, Cuff Size: Normal)   Pulse 67   Temp (!) 97.3 F (36.3 C) (Temporal)   Ht 5\' 9"  (1.753 m)   Wt 236 lb 9.6 oz (107.3 kg)   SpO2 97% Comment: RA  BMI 34.94 kg/m   Wt Readings from Last 5 Encounters:  09/10/19  236 lb 9.6 oz (107.3 kg)  06/10/19 235 lb 9.6 oz (106.9 kg)  05/07/19 260 lb (117.9 kg)  03/12/19 265 lb (120.2 kg)  03/10/19 275 lb (124.7 kg)    BMI Readings from Last 5 Encounters:  09/10/19 34.94 kg/m  06/10/19 34.79 kg/m  05/07/19 38.40 kg/m  03/12/19 38.02 kg/m  03/10/19 40.61 kg/m     Physical Exam Vitals and nursing note reviewed.  Constitutional:      General: He is not in acute distress.    Appearance: Normal appearance. He is obese.  HENT:     Head: Normocephalic and atraumatic.     Right Ear: Hearing, tympanic membrane, ear canal and external ear normal.     Left Ear: Hearing, tympanic membrane, ear canal and external ear normal.     Nose: Nose normal. No mucosal edema or rhinorrhea.     Right Turbinates: Not enlarged.     Left Turbinates: Not enlarged.     Mouth/Throat:     Mouth: Mucous membranes are dry.     Pharynx: Oropharynx is clear. No oropharyngeal exudate.  Eyes:     Pupils: Pupils are equal, round, and reactive to light.  Cardiovascular:     Rate and Rhythm: Normal rate and regular rhythm.     Pulses: Normal pulses.     Heart sounds: Normal heart sounds. No murmur.  Pulmonary:     Effort: Pulmonary effort is normal.     Breath sounds: Normal breath sounds. No decreased breath sounds, wheezing or rales.  Abdominal:     General: Bowel sounds are normal. There is no distension.     Palpations: Abdomen is soft.     Tenderness: There is no abdominal tenderness.  Musculoskeletal:     Cervical back: Normal range of motion.     Right lower leg: No edema.     Left lower leg: No edema.  Lymphadenopathy:     Cervical: No cervical adenopathy.  Skin:    General: Skin is warm and dry.     Capillary Refill: Capillary refill takes less than 2 seconds.     Findings: No erythema or rash.  Neurological:     General: No focal deficit present.     Mental Status: He is alert and oriented to person, place, and time.     Motor: No weakness.      Coordination: Coordination normal.     Gait: Gait is intact. Gait normal.  Psychiatric:        Mood and Affect: Mood normal.  Behavior: Behavior normal. Behavior is cooperative.        Thought Content: Thought content normal.        Judgment: Judgment normal.       Assessment & Plan:   OSA (obstructive sleep apnea) Plan: Complete home sleep study tonight with oral appliance We will review those results May need to consider CPAP therapy start based off of success of home sleep study with oral appliance    Return in about 6 months (around 03/09/2020), or if symptoms worsen or fail to improve, for Follow up with Dr. Elsworth Soho.   Lauraine Rinne, NP 09/10/2019   This appointment required 22 minutes of patient care (this includes precharting, chart review, review of results, face-to-face care, etc.).

## 2019-09-10 NOTE — Assessment & Plan Note (Signed)
Plan: Complete home sleep study tonight with oral appliance We will review those results May need to consider CPAP therapy start based off of success of home sleep study with oral appliance

## 2019-09-21 ENCOUNTER — Telehealth: Payer: Self-pay | Admitting: Pulmonary Disease

## 2019-09-21 DIAGNOSIS — G4733 Obstructive sleep apnea (adult) (pediatric): Secondary | ICD-10-CM | POA: Diagnosis not present

## 2019-09-21 NOTE — Telephone Encounter (Signed)
Intent was to wear oral appliance.  That is how the patient was instructed.  So home sleep study is with oral appliance being used.Wyn Quaker, FNP

## 2019-09-21 NOTE — Telephone Encounter (Signed)
He had mild OSA-AHI was 10/hour On his prior studies AHI was as high as 26/hour So weight loss from 262 to 35 pounds has helped  Was he wearing oral appliance the night of the study? Can consider getting back on oral appliance, otherwise

## 2019-09-22 NOTE — Telephone Encounter (Signed)
Please check with patient whether he had worn oral appliance during sleep study If not, options include getting back on oral appliance or CPAP

## 2019-09-23 NOTE — Telephone Encounter (Signed)
Both the 11/18 & the 12/4 sleep study orders have been linked to the HST.

## 2019-09-23 NOTE — Telephone Encounter (Signed)
Ladies, if you can help on this, it would be appreciated.   It looks like although there is a completed sleep study request showing "active - future" dated 07/16/19. It has the direction for the test to be done with an oral device.  However, there is a completed sleep study dated 09/10/19. Was the test performed on 09/10/19 based on a completely different order?  Maybe a duplicate? Is there a way to see the order that was related to the test done on 09/10/19?

## 2019-09-25 NOTE — Telephone Encounter (Signed)
Dr. Elsworth Soho,   I called the patient and confirmed he did wear the oral appliance during the sleep test.

## 2019-09-25 NOTE — Telephone Encounter (Signed)
Recommendation then would be to continue using oral appliance -although this is not perfect, it does partially correct OSA

## 2019-09-25 NOTE — Telephone Encounter (Signed)
Called the patient and made him aware of the response from Dr. Elsworth Soho. Patient voiced understanding. Nothing further needed at this time.

## 2019-10-01 ENCOUNTER — Encounter (HOSPITAL_BASED_OUTPATIENT_CLINIC_OR_DEPARTMENT_OTHER): Payer: 59 | Admitting: Pulmonary Disease

## 2019-11-05 ENCOUNTER — Other Ambulatory Visit: Payer: Self-pay | Admitting: Internal Medicine

## 2019-11-05 DIAGNOSIS — E782 Mixed hyperlipidemia: Secondary | ICD-10-CM

## 2019-11-20 ENCOUNTER — Ambulatory Visit: Payer: 59 | Attending: Internal Medicine

## 2019-11-20 DIAGNOSIS — Z23 Encounter for immunization: Secondary | ICD-10-CM

## 2019-11-20 NOTE — Progress Notes (Signed)
   Covid-19 Vaccination Clinic  Name:  [name]    MRN: [id number] DOB: 05-02-1966  11/20/2019  Mr. [name] was observed post Covid-19 immunization for 30 minutes based on pre-vaccination screening without incident. He was provided with Vaccine Information Sheet and instruction to access the V-Safe system.   Mr. [name] was instructed to call 911 with any severe reactions post vaccine: Marland Kitchen Difficulty breathing  . Swelling of face and throat  . A fast heartbeat  . A bad rash all over body  . Dizziness and weakness   Immunizations Administered    Name Date Dose VIS Date Route   Moderna COVID-19 Vaccine 11/20/2019  8:05 AM 0.5 mL 07/14/2019 Intramuscular   Manufacturer: Levan Hurst   Lot: [id number]   [place]: [id number]      Covid-19 Vaccination Clinic  Name:  [name]    MRN: [id number] DOB: 1966/01/01  11/20/2019  Mr. [name] was observed post Covid-19 immunization for 30 minutes based on pre-vaccination screening without incident. He was provided with Vaccine Information Sheet and instruction to access the V-Safe system.   Mr. [name] was instructed to call 911 with any severe reactions post vaccine: Marland Kitchen Difficulty breathing  . Swelling of face and throat  . A fast heartbeat  . A bad rash all over body  . Dizziness and weakness   Immunizations Administered    Name Date Dose VIS Date Route   Moderna COVID-19 Vaccine 11/20/2019  8:05 AM 0.5 mL 07/14/2019 Intramuscular   Manufacturer: Moderna   Lot: [id number]   [place]:[id number]

## 2019-11-24 ENCOUNTER — Ambulatory Visit
Admission: RE | Admit: 2019-11-24 | Discharge: 2019-11-24 | Disposition: A | Discharge status: Home / Self Care | Payer: No Typology Code available for payment source | Source: Ambulatory Visit | Attending: Internal Medicine | Admitting: Internal Medicine

## 2019-11-24 DIAGNOSIS — E782 Mixed hyperlipidemia: Secondary | ICD-10-CM

## 2019-12-18 ENCOUNTER — Ambulatory Visit: Payer: Self-pay | Attending: Internal Medicine

## 2019-12-18 DIAGNOSIS — Z23 Encounter for immunization: Secondary | ICD-10-CM

## 2019-12-18 NOTE — Progress Notes (Signed)
   Covid-19 Vaccination Clinic  Name:  [name]    MRN: [id number] DOB: 1965-09-23  12/18/2019  [name] was observed post Covid-19 immunization for 15 minutes without incident. He was provided with Vaccine Information Sheet and instruction to access the V-Safe system.   [name] was instructed to call 911 with any severe reactions post vaccine: Marland Kitchen Difficulty breathing  . Swelling of face and throat  . A fast heartbeat  . A bad rash all over body  . Dizziness and weakness   Immunizations Administered    Name Date Dose VIS Date Route   Moderna COVID-19 Vaccine 12/18/2019  8:10 AM 0.5 mL 07/2019 Intramuscular   Manufacturer: Moderna   Lot: [id number]   [place][id number]

## 2019-12-22 ENCOUNTER — Ambulatory Visit: Payer: 59

## 2020-01-21 ENCOUNTER — Telehealth: Payer: Self-pay | Admitting: *Deleted

## 2020-01-21 ENCOUNTER — Other Ambulatory Visit: Payer: Self-pay

## 2020-01-21 ENCOUNTER — Encounter: Payer: Self-pay | Admitting: Cardiology

## 2020-01-21 ENCOUNTER — Ambulatory Visit: Payer: 59 | Admitting: Cardiology

## 2020-01-21 VITALS — BP 106/70 | HR 72 | Ht 69.0 in | Wt 236.6 lb

## 2020-01-21 DIAGNOSIS — Z8249 Family history of ischemic heart disease and other diseases of the circulatory system: Secondary | ICD-10-CM

## 2020-01-21 DIAGNOSIS — I2510 Atherosclerotic heart disease of native coronary artery without angina pectoris: Secondary | ICD-10-CM

## 2020-01-21 DIAGNOSIS — E7800 Pure hypercholesterolemia, unspecified: Secondary | ICD-10-CM

## 2020-01-21 DIAGNOSIS — R931 Abnormal findings on diagnostic imaging of heart and coronary circulation: Secondary | ICD-10-CM | POA: Diagnosis not present

## 2020-01-21 DIAGNOSIS — R0789 Other chest pain: Secondary | ICD-10-CM

## 2020-01-21 DIAGNOSIS — Z01812 Encounter for preprocedural laboratory examination: Secondary | ICD-10-CM

## 2020-01-21 LAB — BASIC METABOLIC PANEL
BUN/Creatinine Ratio: 14 (ref 9–20)
BUN: 14 mg/dL (ref 6–24)
CO2: 29 mmol/L (ref 20–29)
Calcium: 9.6 mg/dL (ref 8.7–10.2)
Chloride: 102 mmol/L (ref 96–106)
Creatinine, Ser: 1.02 mg/dL (ref 0.76–1.27)
GFR calc Af Amer: 97 mL/min/{1.73_m2} (ref >59)
GFR calc non Af Amer: 84 mL/min/{1.73_m2} (ref >59)
Glucose: 84 mg/dL (ref 65–99)
Potassium: 3.8 mmol/L (ref 3.5–5.2)
Sodium: 140 mmol/L (ref 134–144)

## 2020-01-21 LAB — CBC
Hematocrit: 43.9 % (ref 37.5–51.0)
Hemoglobin: 15.3 g/dL (ref 13.0–17.7)
MCH: 30.5 pg (ref 26.6–33.0)
MCHC: 34.9 g/dL (ref 31.5–35.7)
MCV: 88 fL (ref 79–97)
Platelets: 166 10*3/uL (ref 150–450)
RBC: 5.01 x10E6/uL (ref 4.14–5.80)
RDW: 14.6 % (ref 11.6–15.4)
WBC: 7.1 10*3/uL (ref 3.4–10.8)

## 2020-01-21 MED ORDER — ROSUVASTATIN CALCIUM 20 MG PO TABS: 20.0000 mg | ORAL_TABLET | Freq: Every day | ORAL | 3 refills | Status: DC

## 2020-01-21 MED ORDER — ROSUVASTATIN CALCIUM 20 MG PO TABS: 20.0000 mg | ORAL_TABLET | Freq: Every day | ORAL | 2 refills | Status: DC

## 2020-01-21 NOTE — Addendum Note (Signed)
Addended by: Shellia Cleverly on: 01/21/2020 02:38 PM   Modules accepted: Orders
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
## 2020-01-21 NOTE — Patient Instructions (Signed)
Medication Instructions:  Please start Crestor 20 mg a day.  Continue all other medications as listed.  *If you need a refill on your cardiac medications before your next appointment, please call your pharmacy*  Lab Work: Please have blood work today (CBC, BMP)  If you have labs (blood work) drawn today and your tests are completely normal, you will receive your results only by: Marland Kitchen MyChart Message (if you have MyChart) OR . A paper copy in the mail If you have any lab test that is abnormal or we need to change your treatment, we will call you to review the results.  Testing/Procedures: Your physician has requested that you have a cardiac catheterization. Cardiac catheterization is used to diagnose and/or treat various heart conditions. Doctors may recommend this procedure for a number of different reasons. The most common reason is to evaluate chest pain. Chest pain can be a symptom of coronary artery disease (CAD), and cardiac catheterization can show whether plaque is narrowing or blocking your heart's arteries. This procedure is also used to evaluate the valves, as well as measure the blood flow and oxygen levels in different parts of your heart. For further information please visit HugeFiesta.[email or web address]. Please follow instruction sheet, as given.  Follow-Up: At [facility], you and your health needs are our priority.  As part of our continuing mission to provide you with exceptional heart care, we have created designated Provider Care Teams.  These Care Teams include your primary Cardiologist (physician) and Advanced Practice Providers (APPs -  Physician Assistants and Nurse Practitioners) who all work together to provide you with the care you need, when you need it.  We recommend signing up for the patient portal called "MyChart".  Sign up information is provided on this After Visit Summary.  MyChart is used to connect with patients for Virtual Visits (Telemedicine).  Patients are able to  view lab/test results, encounter notes, upcoming appointments, etc.  Non-urgent messages can be sent to your provider as well.   To learn more about what you can do with MyChart, go to [email or web address].    Follow up will be determined after your heart cath.     [place] OFFICE [place], [place] [address] Dept: [phone] Loc: [place]  01/21/2020  You are scheduled for a cardiac cath on Monday, January 25, 2020 with Dr. Harrell Gave End.  1. Please arrive at the [facility] (Main Entrance A) at [facility]: [place], [place] [address] at  5:30 am (two hours before your procedure to ensure your preparation). Free valet parking service is available.   Special note: Every effort is made to have your procedure done on time. Please understand that emergencies sometimes delay scheduled procedures.  2. Diet: Nothing after midnight  3. Labs: CBC, BMP - today  4. Medication instructions in preparation for your procedure:  On the morning of your procedure, you take take all of your normal medications with sips of water.  Please make sure to take your Asprin.  5. Plan for one night stay--bring personal belongings. 6. Bring a current list of your medications and current insurance cards. 7. You MUST have a responsible person to drive you home. 8. Someone MUST be with you the first 24 hours after you arrive home or your discharge will be delayed. 9. Please wear clothes that are easy to get on and off and wear slip-on  shoes.  Thank you for allowing [place] to care for you!   -- [place] Invasive Cardiovascular services

## 2020-01-21 NOTE — Telephone Encounter (Addendum)
Pt contacted pre-catheterization scheduled at [facility] for: Monday January 25, 2020 7:30 AM Verified arrival time and place: [facility] [facility]) at: 5:30 AM   No solid food after midnight prior to cath, clear liquids until 5 AM day of procedure.  Hold: Losartan-HCT-AM of procedure  Except hold medications AM meds can be  taken pre-cath with sips of water including: ASA 81 mg   Confirmed patient has responsible adult to drive home post procedure and observe 24 hours after arriving home:   You are allowed ONE visitor in the waiting room during your procedure. Both you and your visitor must wear masks.      COVID-19 Pre-Screening Questions:  . In the past 7 to 10 days have you had a cough,  shortness of breath, headache, congestion, fever (100 or greater) body aches, chills, sore throat, or sudden loss of taste or sense of smell? no . In the past 7 to 10 days have you been around anyone with known Covid 19? No  Reviewed procedure/mask/visitor instructions, COVID-19 screening questions with patient.

## 2020-01-25 ENCOUNTER — Ambulatory Visit (HOSPITAL_COMMUNITY)
Admission: RE | Admit: 2020-01-25 | Discharge: 2020-01-25 | Disposition: A | Discharge status: Home / Self Care | Payer: 59 | Attending: Internal Medicine | Admitting: Internal Medicine

## 2020-01-25 ENCOUNTER — Other Ambulatory Visit: Payer: Self-pay

## 2020-01-25 ENCOUNTER — Encounter (HOSPITAL_COMMUNITY)
Admission: RE | Disposition: A | Discharge status: Home / Self Care | Payer: Self-pay | Source: Home / Self Care | Attending: Internal Medicine

## 2020-01-25 DIAGNOSIS — Z79899 Other long term (current) drug therapy: Secondary | ICD-10-CM | POA: Insufficient documentation

## 2020-01-25 DIAGNOSIS — E7800 Pure hypercholesterolemia, unspecified: Secondary | ICD-10-CM | POA: Diagnosis not present

## 2020-01-25 DIAGNOSIS — Z6834 Body mass index (BMI) 34.0-34.9, adult: Secondary | ICD-10-CM | POA: Diagnosis not present

## 2020-01-25 DIAGNOSIS — I10 Essential (primary) hypertension: Secondary | ICD-10-CM | POA: Diagnosis not present

## 2020-01-25 DIAGNOSIS — F17290 Nicotine dependence, other tobacco product, uncomplicated: Secondary | ICD-10-CM | POA: Insufficient documentation

## 2020-01-25 DIAGNOSIS — Z8616 Personal history of COVID-19: Secondary | ICD-10-CM | POA: Diagnosis not present

## 2020-01-25 DIAGNOSIS — Z7982 Long term (current) use of aspirin: Secondary | ICD-10-CM | POA: Diagnosis not present

## 2020-01-25 DIAGNOSIS — E785 Hyperlipidemia, unspecified: Secondary | ICD-10-CM | POA: Insufficient documentation

## 2020-01-25 DIAGNOSIS — I2510 Atherosclerotic heart disease of native coronary artery without angina pectoris: Secondary | ICD-10-CM | POA: Diagnosis present

## 2020-01-25 DIAGNOSIS — K219 Gastro-esophageal reflux disease without esophagitis: Secondary | ICD-10-CM | POA: Diagnosis not present

## 2020-01-25 DIAGNOSIS — G4733 Obstructive sleep apnea (adult) (pediatric): Secondary | ICD-10-CM | POA: Insufficient documentation

## 2020-01-25 DIAGNOSIS — I252 Old myocardial infarction: Secondary | ICD-10-CM | POA: Insufficient documentation

## 2020-01-25 HISTORY — PX: LEFT HEART CATH AND CORONARY ANGIOGRAPHY: CATH118249

## 2020-01-25 SURGERY — LEFT HEART CATH AND CORONARY ANGIOGRAPHY
Anesthesia: LOCAL

## 2020-01-25 MED ORDER — ASPIRIN 81 MG PO CHEW: 81.0000 mg | CHEWABLE_TABLET | ORAL | Status: AC

## 2020-01-25 MED ORDER — SODIUM CHLORIDE 0.9% FLUSH: 3.0000 mL | INTRAVENOUS | Status: DC | PRN

## 2020-01-25 MED ORDER — FENTANYL CITRATE (PF) 100 MCG/2ML IJ SOLN
INTRAMUSCULAR | Status: DC | PRN
  Administered 2020-01-25: 50 ug via INTRAVENOUS

## 2020-01-25 MED ORDER — HEPARIN SODIUM (PORCINE) 1000 UNIT/ML IJ SOLN
INTRAMUSCULAR | Status: AC
  Filled 2020-01-25: qty 1

## 2020-01-25 MED ORDER — VERAPAMIL HCL 2.5 MG/ML IV SOLN
INTRAVENOUS | Status: AC
  Filled 2020-01-25: qty 2

## 2020-01-25 MED ORDER — SODIUM CHLORIDE 0.9% FLUSH: 3.0000 mL | Freq: Two times a day (BID) | INTRAVENOUS | Status: DC

## 2020-01-25 MED ORDER — IOHEXOL 350 MG/ML SOLN
INTRAVENOUS | Status: DC | PRN
  Administered 2020-01-25: 50 mL

## 2020-01-25 MED ORDER — SODIUM CHLORIDE 0.9 % IV SOLN: INTRAVENOUS | Status: DC

## 2020-01-25 MED ORDER — HEPARIN (PORCINE) IN NACL 1000-0.9 UT/500ML-% IV SOLN
INTRAVENOUS | Status: AC
  Filled 2020-01-25: qty 1000

## 2020-01-25 MED ORDER — MIDAZOLAM HCL 2 MG/2ML IJ SOLN
INTRAMUSCULAR | Status: AC
  Filled 2020-01-25: qty 2

## 2020-01-25 MED ORDER — VERAPAMIL HCL 2.5 MG/ML IV SOLN
INTRAVENOUS | Status: DC | PRN
  Administered 2020-01-25: 10 mL via INTRA_ARTERIAL

## 2020-01-25 MED ORDER — MIDAZOLAM HCL 2 MG/2ML IJ SOLN
INTRAMUSCULAR | Status: DC | PRN
  Administered 2020-01-25: 1 mg via INTRAVENOUS

## 2020-01-25 MED ORDER — SODIUM CHLORIDE 0.9 % IV SOLN: 250.0000 mL | INTRAVENOUS | Status: DC | PRN

## 2020-01-25 MED ORDER — LIDOCAINE HCL (PF) 1 % IJ SOLN
INTRAMUSCULAR | Status: DC | PRN
  Administered 2020-01-25: 2 mL

## 2020-01-25 MED ORDER — HEPARIN (PORCINE) IN NACL 1000-0.9 UT/500ML-% IV SOLN
INTRAVENOUS | Status: DC | PRN
  Administered 2020-01-25 (×2): 500 mL

## 2020-01-25 MED ORDER — SODIUM CHLORIDE 0.9 % WEIGHT BASED INFUSION: 1.0000 mL/kg/h | INTRAVENOUS | Status: DC

## 2020-01-25 MED ORDER — HYDRALAZINE HCL 20 MG/ML IJ SOLN: 10.0000 mg | INTRAMUSCULAR | Status: DC | PRN

## 2020-01-25 MED ORDER — FENTANYL CITRATE (PF) 100 MCG/2ML IJ SOLN
INTRAMUSCULAR | Status: AC
  Filled 2020-01-25: qty 2

## 2020-01-25 MED ORDER — LIDOCAINE HCL (PF) 1 % IJ SOLN
INTRAMUSCULAR | Status: AC
  Filled 2020-01-25: qty 30

## 2020-01-25 MED ORDER — SODIUM CHLORIDE 0.9 % WEIGHT BASED INFUSION
3.0000 mL/kg/h | INTRAVENOUS | Status: AC
  Administered 2020-01-25: 3 mL/kg/h via INTRAVENOUS

## 2020-01-25 MED ORDER — ACETAMINOPHEN 325 MG PO TABS: 650.0000 mg | ORAL_TABLET | ORAL | Status: DC | PRN

## 2020-01-25 MED ORDER — HEPARIN SODIUM (PORCINE) 1000 UNIT/ML IJ SOLN
INTRAMUSCULAR | Status: DC | PRN
  Administered 2020-01-25: 5000 [IU] via INTRAVENOUS

## 2020-01-25 MED ORDER — ONDANSETRON HCL 4 MG/2ML IJ SOLN: 4.0000 mg | Freq: Four times a day (QID) | INTRAMUSCULAR | Status: DC | PRN

## 2020-01-25 MED ORDER — LABETALOL HCL 5 MG/ML IV SOLN: 10.0000 mg | INTRAVENOUS | Status: DC | PRN

## 2020-01-25 SURGICAL SUPPLY — 10 items
CATH 5FR JL3.5 JR4 ANG PIG MP (CATHETERS) ×1 IMPLANT
DEVICE RAD COMP TR BAND LRG (VASCULAR PRODUCTS) ×1 IMPLANT
GLIDESHEATH SLEND SS 6F .021 (SHEATH) ×1 IMPLANT
GUIDEWIRE INQWIRE 1.5J.035X260 (WIRE) IMPLANT
INQWIRE 1.5J .035X260CM (WIRE) ×2
KIT HEART LEFT (KITS) ×2 IMPLANT
PACK CARDIAC CATHETERIZATION (CUSTOM PROCEDURE TRAY) ×2 IMPLANT
SYR MEDRAD MARK 7 150ML (SYRINGE) ×2 IMPLANT
TRANSDUCER W/STOPCOCK (MISCELLANEOUS) ×2 IMPLANT
TUBING CIL FLEX 10 FLL-RA (TUBING) ×2 IMPLANT

## 2020-01-25 NOTE — Discharge Instructions (Signed)
Radial Site Care  This sheet gives you information about how to care for yourself after your procedure. Your health care provider may also give you more specific instructions. If you have problems or questions, contact your health care provider. What can I expect after the procedure? After the procedure, it is common to have:  Bruising and tenderness at the catheter insertion area. Follow these instructions at home: Medicines  Take over-the-counter and prescription medicines only as told by your health care provider. Insertion site care  Follow instructions from your health care provider about how to take care of your insertion site. Make sure you: ? Wash your hands with soap and water before you change your bandage (dressing). If soap and water are not available, use hand sanitizer. ? Change your dressing as told by your health care provider. ? Leave stitches (sutures), skin glue, or adhesive strips in place. These skin closures may need to stay in place for 2 weeks or longer. If adhesive strip edges start to loosen and curl up, you may trim the loose edges. Do not remove adhesive strips completely unless your health care provider tells you to do that.  Check your insertion site every day for signs of infection. Check for: ? Redness, swelling, or pain. ? Fluid or blood. ? Pus or a bad smell. ? Warmth.  Do not take baths, swim, or use a hot tub until your health care provider approves.  You may shower 24-48 hours after the procedure, or as directed by your health care provider. ? Remove the dressing and gently wash the site with plain soap and water. ? Pat the area dry with a clean towel. ? Do not rub the site. That could cause bleeding.  Do not apply powder or lotion to the site. Activity   For 24 hours after the procedure, or as directed by your health care provider: ? Do not flex or bend the affected arm. ? Do not push or pull heavy objects with the affected arm. ? Do not  drive yourself home from the hospital or clinic. You may drive 24 hours after the procedure unless your health care provider tells you not to. ? Do not operate machinery or power tools.  Do not lift anything that is heavier than 10 lb (4.5 kg), or the limit that you are told, until your health care provider says that it is safe.  Ask your health care provider when it is okay to: ? Return to work or school. ? Resume usual physical activities or sports. ? Resume sexual activity. General instructions  If the catheter site starts to bleed, raise your arm and put firm pressure on the site. If the bleeding does not stop, get help right away. This is a medical emergency.  If you went home on the same day as your procedure, a responsible adult should be with you for the first 24 hours after you arrive home.  Keep all follow-up visits as told by your health care provider. This is important. Contact a health care provider if:  You have a fever.  You have redness, swelling, or yellow drainage around your insertion site. Get help right away if:  You have unusual pain at the radial site.  The catheter insertion area swells very fast.  The insertion area is bleeding, and the bleeding does not stop when you hold steady pressure on the area.  Your arm or hand becomes pale, cool, tingly, or numb. These symptoms may represent a serious problem   that is an emergency. Do not wait to see if the symptoms will go away. Get medical help right away. Call your local emergency services (911 in the [place] Do not drive yourself to the hospital. Summary  After the procedure, it is common to have bruising and tenderness at the site.  Follow instructions from your health care provider about how to take care of your radial site wound. Check the wound every day for signs of infection.  Do not lift anything that is heavier than 10 lb (4.5 kg), or the limit that you are told, until your health care provider says  that it is safe. This information is not intended to replace advice given to you by your health care provider. Make sure you discuss any questions you have with your health care provider. Document Revised: 09/04/2017 Document Reviewed: 09/04/2017 Elsevier Patient Education  2020 Elsevier Inc.  

## 2020-01-25 NOTE — Interval H&P Note (Signed)
History and Physical Interval Note:  01/25/2020 7:18 AM  [name]  has presented today for surgery, with the diagnosis of coronary artery disease.  The various methods of treatment have been discussed with the patient and family. After consideration of risks, benefits and other options for treatment, the patient has consented to  Procedure(s): LEFT HEART CATH AND CORONARY ANGIOGRAPHY (N/A) as a surgical intervention.  The patient's history has been reviewed, patient examined, no change in status, stable for surgery.  I have reviewed the patient's chart and labs.  Questions were answered to the patient's satisfaction.    Cath Lab Visit (complete for each Cath Lab visit)  Clinical Evaluation Leading to the Procedure:   ACS: No.  Non-ACS:    Anginal Classification: CCS I  Anti-ischemic medical therapy: No Therapy  Non-Invasive Test Results: No non-invasive testing performed (High risk coronary calcium score)  Prior CABG: No previous CABG  [name]

## 2020-01-26 ENCOUNTER — Other Ambulatory Visit: Payer: Self-pay | Admitting: *Deleted

## 2020-01-26 ENCOUNTER — Encounter (HOSPITAL_COMMUNITY): Payer: Self-pay | Admitting: Internal Medicine

## 2020-01-26 DIAGNOSIS — Z79899 Other long term (current) drug therapy: Secondary | ICD-10-CM

## 2020-01-26 DIAGNOSIS — E7800 Pure hypercholesterolemia, unspecified: Secondary | ICD-10-CM

## 2020-05-02 ENCOUNTER — Other Ambulatory Visit: Payer: 59 | Admitting: *Deleted

## 2020-05-02 ENCOUNTER — Other Ambulatory Visit: Payer: Self-pay

## 2020-05-02 ENCOUNTER — Ambulatory Visit: Payer: 59 | Admitting: Cardiology

## 2020-05-02 ENCOUNTER — Encounter: Payer: Self-pay | Admitting: Cardiology

## 2020-05-02 VITALS — BP 108/80 | HR 65 | Ht 69.0 in | Wt 248.0 lb

## 2020-05-02 DIAGNOSIS — I2510 Atherosclerotic heart disease of native coronary artery without angina pectoris: Secondary | ICD-10-CM

## 2020-05-02 DIAGNOSIS — R931 Abnormal findings on diagnostic imaging of heart and coronary circulation: Secondary | ICD-10-CM | POA: Diagnosis not present

## 2020-05-02 DIAGNOSIS — E7800 Pure hypercholesterolemia, unspecified: Secondary | ICD-10-CM | POA: Diagnosis not present

## 2020-05-02 DIAGNOSIS — Z8249 Family history of ischemic heart disease and other diseases of the circulatory system: Secondary | ICD-10-CM

## 2020-05-02 DIAGNOSIS — Z79899 Other long term (current) drug therapy: Secondary | ICD-10-CM

## 2020-05-02 LAB — LIPID PANEL
Chol/HDL Ratio: 3.1 ratio (ref 0.0–5.0)
Cholesterol, Total: 112 mg/dL (ref 100–199)
HDL: 36 mg/dL — ABNORMAL LOW (ref >39)
LDL Chol Calc (NIH): 56 mg/dL (ref 0–99)
Triglycerides: 104 mg/dL (ref 0–149)
VLDL Cholesterol Cal: 20 mg/dL (ref 5–40)

## 2020-05-02 LAB — ALT: ALT: 38 IU/L (ref 0–44)

## 2020-05-02 NOTE — Progress Notes (Signed)
Cardiology Office Note:    Date:  05/02/2020   ID:  [name], DOB August 16, 1965, MRN [id number]  PCP:  Lavone Orn, MD  [facility] Cardiologist:  No primary care provider on file.  [facility] Electrophysiologist:  None   Referring MD: Lavone Orn, MD    History of Present Illness:    [name] is a 54 y.o. male here for the follow-up of coronary artery disease, cardiac catheterization as below.  LAD disease.  Severely elevated calcium score.  Overall no chest pain no fevers chills nausea vomiting syncope bleeding.  Past Medical History:  Diagnosis Date  . Abnormal findings on diagnostic imaging of lung 05/20/2019   05/08/2019-CTA chest- no evidence of PE, bilateral airspace densities most consistent with multifocal pneumonia, cardiomegaly and advanced three-vessel coronary vascular calcification, mildly enlarged left hilar and subcarinal lymph nodes  . Abnormal liver function 05/07/2019  . Acute pain of left knee 06/05/2017  . Adenomatous polyp   . Asthma    Dr. Laurann Montana  . CAD (coronary artery disease)   . Chronic respiratory failure with hypoxia ([place]) 05/20/2019  . Cough variant asthma vs recurrent upper airway cough syndrome 10/16/2013   post bronchitic or related to reflux -  07/26/2014 p extensive coaching HFA effectiveness =    90%  - Allergy profile 07/02/16  >  Eos 0.1 /  IgE 22  RAST neg    . COVID-19 virus infection 05/07/2019  . Elevated liver function tests   . Elevated troponin 05/07/2019  . Essential hypertension 07/06/2016  . GERD (gastroesophageal reflux disease) 10/15/2016  . Heart murmur   . Hemoptysis 03/12/2019   03/10/2019-CBC-hemoglobin 16.2  03/10/2019-CTA chest- examination of PE was limited by breath and motion, particularly in lung bases.  Within this limitation no evidence of PE through the proximal segmental pulmonary arterial level, CAD   . Hepatic steatosis   . Hiatal hernia   . Hyperlipidemia   . Hypertension   . Hypertensive disorder  10/15/2016  . Kidney stones   . Low testosterone   . Morbid obesity due to excess calories ([place]) 07/06/2016  . Nephrolithiasis   . Obesity   . OSA (obstructive sleep apnea)   . Rash 08/02/2014  . Renal disorder   . Rotator cuff disorder   . Sinobronchitis 07/26/2014  . Smokeless tobacco use 08/15/2018    Past Surgical History:  Procedure Laterality Date  . LEFT HEART CATH AND CORONARY ANGIOGRAPHY N/A 01/25/2020   Procedure: LEFT HEART CATH AND CORONARY ANGIOGRAPHY;  Surgeon: Nelva Bush, MD;  Location: [place] [facility];  Service: Cardiovascular;  Laterality: N/A;    Current Medications: Current Meds  Medication Sig  . albuterol (PROAIR HFA) 108 (90 Base) MCG/ACT inhaler Inhale 2 puffs into the lungs every 6 (six) hours as needed for wheezing or shortness of breath.  Marland Kitchen aspirin 81 MG chewable tablet Chew 1 tablet (81 mg total) by mouth daily.  . calcium carbonate (TUMS - DOSED IN MG ELEMENTAL CALCIUM) 500 MG chewable tablet Chew 2 tablets by mouth daily as needed for indigestion or heartburn.  . Cholecalciferol (D3-1000 PO) Take by mouth daily.  Marland Kitchen esomeprazole (NEXIUM) 40 MG capsule Take 40 mg by mouth See admin instructions. Take 40 mg daily, may take a second dose as needed for acid reflux  . latanoprost (XALATAN) 0.005 % ophthalmic solution Place 1 drop into both eyes at bedtime.   Marland Kitchen losartan-hydrochlorothiazide (HYZAAR) 50-12.5 MG tablet Take 1 tablet by mouth daily.  Marland Kitchen  Multiple Vitamin (MULTIVITAMIN) tablet Take 1 tablet by mouth daily.  . rosuvastatin (CRESTOR) 20 MG tablet Take 1 tablet (20 mg total) by mouth daily.  . sodium chloride (OCEAN) 0.65 % SOLN nasal spray Place 1 spray into both nostrils as needed for congestion.  Marland Kitchen zinc gluconate 50 MG tablet Take 50 mg by mouth daily.     Allergies:   Clindamycin/lincomycin, Lisinopril, Neosporin [neomycin-bacitracin zn-polymyx], Penicillins, and Tramadol hcl   Social History   Socioeconomic History  . Marital status:  Married    Spouse name: Not on file  . Number of children: 0  . Years of education: Not on file  . Highest education level: Not on file  Occupational History  . Occupation: Hydrologist: [employer]  Tobacco Use  . Smoking status: Former Smoker    Packs/day: 1.00    Years: 1.00    Pack years: 1.00    Types: Cigarettes    Quit date: 08/14/1983    Years since quitting: 36.7  . Smokeless tobacco: Current User    Types: Chew  . Tobacco comment: dip  Substance and Sexual Activity  . Alcohol use: Yes    Alcohol/week: 0.0 standard drinks    Comment: occ.  . Drug use: No  . Sexual activity: Not on file  Other Topics Concern  . Not on file  Social History Narrative  . Not on file   Social Determinants of Health   Financial Resource Strain:   . Difficulty of Paying Living Expenses: Not on file  Food Insecurity:   . Worried About Charity fundraiser in the Last Year: Not on file  . Ran Out of Food in the Last Year: Not on file  Transportation Needs:   . Lack of Transportation (Medical): Not on file  . Lack of Transportation (Non-Medical): Not on file  Physical Activity:   . Days of Exercise per Week: Not on file  . Minutes of Exercise per Session: Not on file  Stress:   . Feeling of Stress : Not on file  Social Connections:   . Frequency of Communication with Friends and Family: Not on file  . Frequency of Social Gatherings with Friends and Family: Not on file  . Attends Religious Services: Not on file  . Active Member of Clubs or Organizations: Not on file  . Attends Archivist Meetings: Not on file  . Marital Status: Not on file     Family History: The patient's family history includes CAD in his father, paternal grandfather, and paternal grandmother; Cancer - Colon in his maternal aunt, maternal grandfather, and maternal grandmother; Colon cancer (age of onset: 64) in his mother; Diabetes in his father; GER disease in his mother;  Heart disease (age of onset: 5) in his father; Heart failure (age of onset: 62) in his father; Hypertension in his mother; Transient ischemic attack (age of onset: 70) in his mother.  ROS:   Please see the history of present illness.     All other systems reviewed and are negative.  EKGs/Labs/Other Studies Reviewed:    The following studies were reviewed today: See below  EKG: No new  Recent Labs: 05/07/2019: B Natriuretic Peptide 14.0 05/11/2019: ALT 72 01/21/2020: BUN 14; Creatinine, Ser 1.02; Hemoglobin 15.3; Platelets 166; Potassium 3.8; Sodium 140  Recent Lipid Panel    Component Value Date/Time   CHOL 182 01/05/2011 0600   TRIG 159 (H) 01/05/2011 0600   HDL 30 (L)  01/05/2011 0600   CHOLHDL 6.1 01/05/2011 0600   VLDL 32 01/05/2011 0600   LDLCALC (H) 01/05/2011 0600    120        Total Cholesterol/HDL:CHD Risk Coronary Heart Disease Risk Table                     Men   Women  1/2 Average Risk   3.4   3.3  Average Risk       5.0   4.4  2 X Average Risk   9.6   7.1  3 X Average Risk  23.4   11.0        Use the calculated Patient Ratio above and the CHD Risk Table to determine the patient's CHD Risk.        ATP III CLASSIFICATION (LDL):  <100     mg/dL   Optimal  100-129  mg/dL   Near or Above                    Optimal  130-159  mg/dL   Borderline  160-189  mg/dL   High  >190     mg/dL   Very High    Physical Exam:    VS:  BP 108/80   Pulse 65   Ht 5\' 9"  (1.753 m)   Wt 248 lb (112.5 kg)   SpO2 96%   BMI 36.62 kg/m     Wt Readings from Last 3 Encounters:  05/02/20 248 lb (112.5 kg)  01/25/20 236 lb (107 kg)  01/21/20 236 lb 9.6 oz (107.3 kg)     GEN:  Well nourished, well developed in no acute distress HEENT: Normal NECK: No JVD; No carotid bruits LYMPHATICS: No lymphadenopathy CARDIAC: RRR, no murmurs, rubs, gallops RESPIRATORY:  Clear to auscultation without rales, wheezing or rhonchi  ABDOMEN: Soft, non-tender, non-distended MUSCULOSKELETAL:   No edema; No deformity  SKIN: Warm and dry NEUROLOGIC:  Alert and oriented x 3 PSYCHIATRIC:  Normal affect   ASSESSMENT:    1. Coronary artery disease involving native coronary artery of native heart without angina pectoris   2. Elevated coronary artery calcium score   3. Family history of early CAD   12. Pure hypercholesterolemia    PLAN:    In order of problems listed above:  Coronary artery disease 1. -Cardiac catheterization 01/25/2020: Personally reviewed the films. 2. Moderate to severe single-vessel coronary artery disease with focal 70% mid LAD stenosis just beyond the takeoff of D2.  The distal LAD is relatively small caliber and is likely diffusely diseased. 3. Mild to moderate, non-obstructive coronary artery disease involving the LMCA, LCx, and RCA. 4. Low normal left ventricular systolic function with apical inferior hypokinesis.  LVEF ~50-55%. 5. Mildly elevated left ventricular filling pressure (LVEDP 20-25 mmHg).  I increased his Crestor to 20 mg once a day.  Continue with aggressive risk factor modification. Echo ejection fraction 75%.  Excellent.  Family history of coronary disease -Mother CAD post stenting.  Prior [facility] -[facility]  Coronary artery calcification -Calcium score 4900, intensive risk factor modification.  Cath as above.  GERD -Nexium.  Has had symptoms of heartburn.  Trying to keep a food diary to figure out what is causing.  Hyperlipidemia -Prior LDL 122, LDL goal less than 70.  Today labs were drawn.  Overall he is tolerating the Crestor well.  Had some mild joint aches in his shoulders arms when first starting.  This is resolved.  Outside lab work reviewed, creatinine 1.02 glucose 84 LDL on 01/29/20 was 48.  Medication Adjustments/Labs and Tests Ordered: Current medicines are reviewed at length with the patient today.  Concerns regarding medicines are outlined above.  No orders of the defined types were placed in this  encounter.  No orders of the defined types were placed in this encounter.   Patient Instructions  Medication Instructions:  The current medical regimen is effective;  continue present plan and medications.  *If you need a refill on your cardiac medications before your next appointment, please call your pharmacy*  Follow-Up: At [facility], you and your health needs are our priority.  As part of our continuing mission to provide you with exceptional heart care, we have created designated Provider Care Teams.  These Care Teams include your primary Cardiologist (physician) and Advanced Practice Providers (APPs -  Physician Assistants and Nurse Practitioners) who all work together to provide you with the care you need, when you need it.  We recommend signing up for the patient portal called "MyChart".  Sign up information is provided on this After Visit Summary.  MyChart is used to connect with patients for Virtual Visits (Telemedicine).  Patients are able to view lab/test results, encounter notes, upcoming appointments, etc.  Non-urgent messages can be sent to your provider as well.   To learn more about what you can do with MyChart, go to [email or web address].    Your next appointment:   6 month(s)  The format for your next appointment:   In Person  Provider:   Candee Furbish, MD   Thank you for choosing [facility]!!         Signed, Candee Furbish, MD  05/02/2020 8:49 AM    [place]

## 2020-05-02 NOTE — Patient Instructions (Signed)

## 2020-07-26 DIAGNOSIS — M84374D Stress fracture, right foot, subsequent encounter for fracture with routine healing: Secondary | ICD-10-CM | POA: Diagnosis not present

## 2020-08-05 DIAGNOSIS — M84374D Stress fracture, right foot, subsequent encounter for fracture with routine healing: Secondary | ICD-10-CM | POA: Diagnosis not present

## 2020-08-15 ENCOUNTER — Other Ambulatory Visit: Payer: Self-pay

## 2020-08-15 ENCOUNTER — Telehealth: Payer: Self-pay | Admitting: Cardiology

## 2020-08-15 MED ORDER — ROSUVASTATIN CALCIUM 20 MG PO TABS: 20.0000 mg | ORAL_TABLET | Freq: Every day | ORAL | 3 refills | Status: DC

## 2020-08-15 NOTE — Telephone Encounter (Signed)
*  STAT* If patient is at the pharmacy, call can be transferred to refill team.   1. Which medications need to be refilled? (please list name of each medication and dose if known) rosuvastatin (CRESTOR) 20 MG tablet  2. Which pharmacy/location (including street and city if local pharmacy) is medication to be sent to? [facility] - [address] [place], [place], [place] [address]  3. Do they need a 30 day or 90 day supply? 90

## 2020-08-16 ENCOUNTER — Telehealth: Payer: Self-pay | Admitting: Cardiology

## 2020-08-16 NOTE — Telephone Encounter (Signed)
    [facility] called, she confirming refill quantity for crestor. Per request sent yesterday 08/15/2020, it's for 90 days supply

## 2020-10-27 ENCOUNTER — Encounter: Payer: Self-pay | Admitting: *Deleted

## 2020-10-27 ENCOUNTER — Encounter: Payer: Self-pay | Admitting: Cardiology

## 2020-10-27 ENCOUNTER — Other Ambulatory Visit: Payer: Self-pay

## 2020-10-27 ENCOUNTER — Ambulatory Visit: Payer: BC Managed Care – PPO | Admitting: Cardiology

## 2020-10-27 VITALS — BP 120/80 | HR 66 | Ht 69.0 in | Wt 247.0 lb

## 2020-10-27 DIAGNOSIS — R931 Abnormal findings on diagnostic imaging of heart and coronary circulation: Secondary | ICD-10-CM

## 2020-10-27 DIAGNOSIS — R072 Precordial pain: Secondary | ICD-10-CM

## 2020-10-27 DIAGNOSIS — Z8249 Family history of ischemic heart disease and other diseases of the circulatory system: Secondary | ICD-10-CM

## 2020-10-27 DIAGNOSIS — E7800 Pure hypercholesterolemia, unspecified: Secondary | ICD-10-CM

## 2020-10-27 DIAGNOSIS — I2510 Atherosclerotic heart disease of native coronary artery without angina pectoris: Secondary | ICD-10-CM | POA: Diagnosis not present

## 2020-10-27 NOTE — Patient Instructions (Signed)
Medication Instructions:  The current medical regimen is effective;  continue present plan and medications.  *If you need a refill on your cardiac medications before your next appointment, please call your pharmacy*  Testing/Procedures: Your physician has requested that you have a lexiscan myoview. For further information please visit [email or web address]. Please follow instruction sheet, as given.  Follow-Up: At [facility], you and your health needs are our priority.  As part of our continuing mission to provide you with exceptional heart care, we have created designated Provider Care Teams.  These Care Teams include your primary Cardiologist (physician) and Advanced Practice Providers (APPs -  Physician Assistants and Nurse Practitioners) who all work together to provide you with the care you need, when you need it.  We recommend signing up for the patient portal called "MyChart".  Sign up information is provided on this After Visit Summary.  MyChart is used to connect with patients for Virtual Visits (Telemedicine).  Patients are able to view lab/test results, encounter notes, upcoming appointments, etc.  Non-urgent messages can be sent to your provider as well.   To learn more about what you can do with MyChart, go to [email or web address].    Your next appointment:   6 month(s)  The format for your next appointment:   In Person  Provider:   Candee Furbish, MD   Thank you for choosing [facility]!!

## 2020-10-27 NOTE — Progress Notes (Signed)
Cardiology Office Note:    Date:  10/27/2020   ID:  DAT DERKSEN, DOB Oct 15, 1965, MRN [id number]  PCP:  Lavone Orn, MD   [place]  Cardiologist:  Candee Furbish, MD  Advanced Practice Provider:  No care team member to display Electrophysiologist:  None       Referring MD: Lavone Orn, MD     History of Present Illness:    [name] is a 55 y.o. male here for follow-up of coronary artery disease.  LAD disease in the setting of severely elevated calcium score.  Upon review of cardiac catheterization, there was a small focal LAD lesion that does appear to be more significantly stenotic than 70%.  Distal to this lesion however the remainder of the LAD is quite diffusely diseased and small in size approximately 2 mm as reported.  Doing quite well.  He does not seem to have any significant anginal symptoms when going uphill or stairs for instance.  He still has chest discomfort at times when eating certain foods.  Currently not having any fever chills nausea vomiting syncope bleeding.  Past Medical History:  Diagnosis Date  . Abnormal findings on diagnostic imaging of lung 05/20/2019   05/08/2019-CTA chest- no evidence of PE, bilateral airspace densities most consistent with multifocal pneumonia, cardiomegaly and advanced three-vessel coronary vascular calcification, mildly enlarged left hilar and subcarinal lymph nodes  . Abnormal liver function 05/07/2019  . Acute pain of left knee 06/05/2017  . Adenomatous polyp   . Asthma    Dr. Laurann Montana  . CAD (coronary artery disease)   . Chronic respiratory failure with hypoxia ([place]) 05/20/2019  . Cough variant asthma vs recurrent upper airway cough syndrome 10/16/2013   post bronchitic or related to reflux -  07/26/2014 p extensive coaching HFA effectiveness =    90%  - Allergy profile 07/02/16  >  Eos 0.1 /  IgE 22  RAST neg    . COVID-19 virus infection 05/07/2019  . Elevated liver function tests   . Elevated  troponin 05/07/2019  . Essential hypertension 07/06/2016  . GERD (gastroesophageal reflux disease) 10/15/2016  . Heart murmur   . Hemoptysis 03/12/2019   03/10/2019-CBC-hemoglobin 16.2  03/10/2019-CTA chest- examination of PE was limited by breath and motion, particularly in lung bases.  Within this limitation no evidence of PE through the proximal segmental pulmonary arterial level, CAD   . Hepatic steatosis   . Hiatal hernia   . Hyperlipidemia   . Hypertension   . Hypertensive disorder 10/15/2016  . Kidney stones   . Low testosterone   . Morbid obesity due to excess calories ([place]) 07/06/2016  . Nephrolithiasis   . Obesity   . OSA (obstructive sleep apnea)   . Rash 08/02/2014  . Renal disorder   . Rotator cuff disorder   . Sinobronchitis 07/26/2014  . Smokeless tobacco use 08/15/2018    Past Surgical History:  Procedure Laterality Date  . LEFT HEART CATH AND CORONARY ANGIOGRAPHY N/A 01/25/2020   Procedure: LEFT HEART CATH AND CORONARY ANGIOGRAPHY;  Surgeon: Nelva Bush, MD;  Location: [facility];  Service: Cardiovascular;  Laterality: N/A;    Current Medications: Current Meds  Medication Sig  . albuterol (PROAIR HFA) 108 (90 Base) MCG/ACT inhaler Inhale 2 puffs into the lungs every 6 (six) hours as needed for wheezing or shortness of breath.  Marland Kitchen aspirin 81 MG chewable tablet Chew 1 tablet (81 mg total) by mouth daily.  . calcium carbonate (  TUMS - DOSED IN MG ELEMENTAL CALCIUM) 500 MG chewable tablet Chew 2 tablets by mouth daily as needed for indigestion or heartburn.  . Cholecalciferol (D3-1000 PO) Take by mouth daily.  Marland Kitchen esomeprazole (NEXIUM) 40 MG capsule Take 40 mg by mouth See admin instructions. Take 40 mg daily, may take a second dose as needed for acid reflux  . latanoprost (XALATAN) 0.005 % ophthalmic solution Place 1 drop into both eyes at bedtime.   Marland Kitchen losartan-hydrochlorothiazide (HYZAAR) 50-12.5 MG tablet Take 1 tablet by mouth daily.  . Multiple Vitamin  (MULTIVITAMIN) tablet Take 1 tablet by mouth daily.  . rosuvastatin (CRESTOR) 20 MG tablet Take 1 tablet (20 mg total) by mouth daily.  . sodium chloride (OCEAN) 0.65 % SOLN nasal spray Place 1 spray into both nostrils as needed for congestion.  Marland Kitchen zinc gluconate 50 MG tablet Take 50 mg by mouth daily.     Allergies:   Clindamycin/lincomycin, Lisinopril, Neosporin [neomycin-bacitracin zn-polymyx], Penicillins, and Tramadol hcl   Social History   Socioeconomic History  . Marital status: Married    Spouse name: Not on file  . Number of children: 0  . Years of education: Not on file  . Highest education level: Not on file  Occupational History  . Occupation: Hydrologist: [employer]  Tobacco Use  . Smoking status: Former Smoker    Packs/day: 1.00    Years: 1.00    Pack years: 1.00    Types: Cigarettes    Quit date: 08/14/1983    Years since quitting: 37.2  . Smokeless tobacco: Current User    Types: Chew  . Tobacco comment: dip  Substance and Sexual Activity  . Alcohol use: Yes    Alcohol/week: 0.0 standard drinks    Comment: occ.  . Drug use: No  . Sexual activity: Not on file  Other Topics Concern  . Not on file  Social History Narrative  . Not on file   Social Determinants of Health   Financial Resource Strain: Not on file  Food Insecurity: Not on file  Transportation Needs: Not on file  Physical Activity: Not on file  Stress: Not on file  Social Connections: Not on file     Family History: The patient's family history includes CAD in his father, paternal grandfather, and paternal grandmother; Cancer - Colon in his maternal aunt, maternal grandfather, and maternal grandmother; Colon cancer (age of onset: 20) in his mother; Diabetes in his father; GER disease in his mother; Heart disease (age of onset: 43) in his father; Heart failure (age of onset: 26) in his father; Hypertension in his mother; Transient ischemic attack (age of onset:  45) in his mother.  ROS:   Please see the history of present illness.     All other systems reviewed and are negative.  EKGs/Labs/Other Studies Reviewed:    The following studies were reviewed today:  Cath as below Calcium score 11/24/2019: The observed calcium score of 4921 is at the percentile 99 for subjects of the same age, gender and race/ethnicity who are free of clinical cardiovascular disease and treated diabetes.  EKG:  EKG is  ordered today.  The ekg ordered today demonstrates sinus rhythm PVC 66 bpm  Recent Labs: 01/21/2020: BUN 14; Creatinine, Ser 1.02; Hemoglobin 15.3; Platelets 166; Potassium 3.8; Sodium 140 05/02/2020: ALT 38  Recent Lipid Panel    Component Value Date/Time   CHOL 112 05/02/2020 0739   TRIG 104 05/02/2020 0739  HDL 36 (L) 05/02/2020 0739   CHOLHDL 3.1 05/02/2020 0739   CHOLHDL 6.1 01/05/2011 0600   VLDL 32 01/05/2011 0600   LDLCALC 56 05/02/2020 0739     Risk Assessment/Calculations:      Physical Exam:    VS:  BP 120/80 (BP Location: Left Arm, Patient Position: Sitting, Cuff Size: Normal)   Pulse 66   Ht 5\' 9"  (1.753 m)   Wt 247 lb (112 kg)   SpO2 96%   BMI 36.48 kg/m     Wt Readings from Last 3 Encounters:  10/27/20 247 lb (112 kg)  05/02/20 248 lb (112.5 kg)  01/25/20 236 lb (107 kg)     GEN:  Well nourished, well developed in no acute distress HEENT: Normal NECK: No JVD; No carotid bruits LYMPHATICS: No lymphadenopathy CARDIAC: RRR, no murmurs, rubs, gallops RESPIRATORY:  Clear to auscultation without rales, wheezing or rhonchi  ABDOMEN: Soft, non-tender, non-distended MUSCULOSKELETAL:  No edema; No deformity  SKIN: Warm and dry NEUROLOGIC:  Alert and oriented x 3 PSYCHIATRIC:  Normal affect   ASSESSMENT:    1. Coronary artery disease involving native coronary artery of native heart without angina pectoris   2. Elevated coronary artery calcium score   3. Family history of early CAD   68. Pure hypercholesterolemia    5. Precordial pain    PLAN:    In order of problems listed above:  Coronary artery disease -Cardiac catheterization performed on 01/25/2020-demonstrated moderate to severe single-vessel coronary disease with focal 70% mid LAD stenosis just beyond the takeoff of diagonal 2.  Reviewing cardiac catheterization, there are some areas/views where the LAD disease focally seems more significant.  The distal LAD is relatively small in caliber and likely diffusely diseased.  He also had mild to moderate nonobstructive CAD involving the left main coronary artery left circumflex as well as right coronary artery.   Filling pressures were mildly elevated at 20-25. -We will go ahead and check a Lexiscan stress test.  If the anterior wall is high risk, would strongly consider repeat cardiac catheterization with further evaluation, intracoronary nitroglycerin. -Financial concerns are present given his $7500 deductible he states.  Hyperlipidemia -We increased his Crestor to 20 mg once a day.  High intensity statin dose.  Doing well without any myalgias. -Prior LDL was 122.  He did feel some mild joint aches in his shoulders when for starting Crestor.  This had resolved.  Most recent LDL 56.  Family history of CAD -Mother had coronary disease post stenting  Prior Covid infection -Was at [facility].  GERD -Nexium.  Food diary.  6 month follow up  Shared Decision Making/Informed Consent The risks [chest pain, shortness of breath, cardiac arrhythmias, dizziness, blood pressure fluctuations, myocardial infarction, stroke/transient ischemic attack, nausea, vomiting, allergic reaction, radiation exposure, metallic taste sensation and life-threatening complications (estimated to be 1 in 10,000)], benefits (risk stratification, diagnosing coronary artery disease, treatment guidance) and alternatives of a nuclear stress test were discussed in detail with Mr. Girgenti and he agrees to proceed.        Medication Adjustments/Labs and Tests Ordered: Current medicines are reviewed at length with the patient today.  Concerns regarding medicines are outlined above.  Orders Placed This Encounter  Procedures  . MYOCARDIAL PERFUSION IMAGING  . EKG 12-Lead   No orders of the defined types were placed in this encounter.   Patient Instructions  Medication Instructions:  The current medical regimen is effective;  continue present plan and medications.  *  If you need a refill on your cardiac medications before your next appointment, please call your pharmacy*  Testing/Procedures: Your physician has requested that you have a lexiscan myoview. For further information please visit [email or web address]. Please follow instruction sheet, as given.  Follow-Up: At [facility], you and your health needs are our priority.  As part of our continuing mission to provide you with exceptional heart care, we have created designated Provider Care Teams.  These Care Teams include your primary Cardiologist (physician) and Advanced Practice Providers (APPs -  Physician Assistants and Nurse Practitioners) who all work together to provide you with the care you need, when you need it.  We recommend signing up for the patient portal called "MyChart".  Sign up information is provided on this After Visit Summary.  MyChart is used to connect with patients for Virtual Visits (Telemedicine).  Patients are able to view lab/test results, encounter notes, upcoming appointments, etc.  Non-urgent messages can be sent to your provider as well.   To learn more about what you can do with MyChart, go to [email or web address].    Your next appointment:   6 month(s)  The format for your next appointment:   In Person  Provider:   Candee Furbish, MD   Thank you for choosing [facility]!!         Signed, Candee Furbish, MD  10/27/2020 4:50 PM    [place]

## 2020-11-03 DIAGNOSIS — E782 Mixed hyperlipidemia: Secondary | ICD-10-CM | POA: Diagnosis not present

## 2020-11-03 DIAGNOSIS — I10 Essential (primary) hypertension: Secondary | ICD-10-CM | POA: Diagnosis not present

## 2020-11-03 DIAGNOSIS — Z0000 Encounter for general adult medical examination without abnormal findings: Secondary | ICD-10-CM | POA: Diagnosis not present

## 2020-11-03 DIAGNOSIS — K219 Gastro-esophageal reflux disease without esophagitis: Secondary | ICD-10-CM | POA: Diagnosis not present

## 2020-11-08 ENCOUNTER — Telehealth (HOSPITAL_COMMUNITY): Payer: Self-pay | Admitting: *Deleted

## 2020-11-08 NOTE — Telephone Encounter (Signed)
Left message on voicemail per DPR in reference to upcoming appointment scheduled on 11/11/20 at 7:45 with detailed instructions given per Myocardial Perfusion Study Information Sheet for the test. LM to arrive 15 minutes early, and that it is imperative to arrive on time for appointment to keep from having the test rescheduled. If you need to cancel or reschedule your appointment, please call the office within 24 hours of your appointment. Failure to do so may result in a cancellation of your appointment, and a $50 no show fee. Phone number given for call back for any questions.

## 2020-11-11 ENCOUNTER — Other Ambulatory Visit: Payer: Self-pay

## 2020-11-11 ENCOUNTER — Ambulatory Visit (HOSPITAL_COMMUNITY): Payer: BC Managed Care – PPO | Attending: Internal Medicine

## 2020-11-11 DIAGNOSIS — R072 Precordial pain: Secondary | ICD-10-CM | POA: Insufficient documentation

## 2020-11-11 DIAGNOSIS — I2510 Atherosclerotic heart disease of native coronary artery without angina pectoris: Secondary | ICD-10-CM | POA: Diagnosis not present

## 2020-11-11 LAB — MYOCARDIAL PERFUSION IMAGING
LV dias vol: 134 mL (ref 62–150)
LV sys vol: 72 mL
Peak HR: 83 {beats}/min
Rest HR: 56 {beats}/min
SDS: 3
SRS: 6
SSS: 11
TID: 1

## 2020-11-11 MED ORDER — REGADENOSON 0.4 MG/5ML IV SOLN
0.4000 mg | Freq: Once | INTRAVENOUS | Status: AC
  Administered 2020-11-11: 0.4 mg via INTRAVENOUS

## 2020-11-11 MED ORDER — TECHNETIUM TC 99M TETROFOSMIN IV KIT
29.6000 | PACK | Freq: Once | INTRAVENOUS | Status: AC | PRN
  Administered 2020-11-11: 29.6 via INTRAVENOUS
  Filled 2020-11-11: qty 30

## 2020-11-11 MED ORDER — TECHNETIUM TC 99M TETROFOSMIN IV KIT
10.3000 | PACK | Freq: Once | INTRAVENOUS | Status: AC | PRN
  Administered 2020-11-11: 10.3 via INTRAVENOUS
  Filled 2020-11-11: qty 11

## 2020-12-26 DIAGNOSIS — Z85828 Personal history of other malignant neoplasm of skin: Secondary | ICD-10-CM | POA: Diagnosis not present

## 2020-12-26 DIAGNOSIS — D225 Melanocytic nevi of trunk: Secondary | ICD-10-CM | POA: Diagnosis not present

## 2020-12-26 DIAGNOSIS — L570 Actinic keratosis: Secondary | ICD-10-CM | POA: Diagnosis not present

## 2020-12-26 DIAGNOSIS — L814 Other melanin hyperpigmentation: Secondary | ICD-10-CM | POA: Diagnosis not present

## 2020-12-26 DIAGNOSIS — L821 Other seborrheic keratosis: Secondary | ICD-10-CM | POA: Diagnosis not present

## 2021-01-07 ENCOUNTER — Emergency Department (HOSPITAL_COMMUNITY)
Admission: EM | Admit: 2021-01-07 | Discharge: 2021-01-07 | Disposition: A | Discharge status: Home / Self Care | Payer: BC Managed Care – PPO | Attending: Emergency Medicine | Admitting: Emergency Medicine

## 2021-01-07 ENCOUNTER — Emergency Department (HOSPITAL_COMMUNITY): Payer: BC Managed Care – PPO

## 2021-01-07 ENCOUNTER — Other Ambulatory Visit: Payer: Self-pay

## 2021-01-07 DIAGNOSIS — N50812 Left testicular pain: Secondary | ICD-10-CM | POA: Insufficient documentation

## 2021-01-07 DIAGNOSIS — Z9861 Coronary angioplasty status: Secondary | ICD-10-CM | POA: Diagnosis not present

## 2021-01-07 DIAGNOSIS — Z8616 Personal history of COVID-19: Secondary | ICD-10-CM | POA: Diagnosis not present

## 2021-01-07 DIAGNOSIS — N200 Calculus of kidney: Secondary | ICD-10-CM | POA: Diagnosis not present

## 2021-01-07 DIAGNOSIS — R7401 Elevation of levels of liver transaminase levels: Secondary | ICD-10-CM | POA: Diagnosis not present

## 2021-01-07 DIAGNOSIS — Z87891 Personal history of nicotine dependence: Secondary | ICD-10-CM | POA: Diagnosis not present

## 2021-01-07 DIAGNOSIS — J45909 Unspecified asthma, uncomplicated: Secondary | ICD-10-CM | POA: Insufficient documentation

## 2021-01-07 DIAGNOSIS — N433 Hydrocele, unspecified: Secondary | ICD-10-CM | POA: Diagnosis not present

## 2021-01-07 DIAGNOSIS — I2510 Atherosclerotic heart disease of native coronary artery without angina pectoris: Secondary | ICD-10-CM | POA: Diagnosis not present

## 2021-01-07 DIAGNOSIS — N5089 Other specified disorders of the male genital organs: Secondary | ICD-10-CM | POA: Diagnosis not present

## 2021-01-07 DIAGNOSIS — D72829 Elevated white blood cell count, unspecified: Secondary | ICD-10-CM | POA: Insufficient documentation

## 2021-01-07 DIAGNOSIS — I10 Essential (primary) hypertension: Secondary | ICD-10-CM | POA: Diagnosis not present

## 2021-01-07 DIAGNOSIS — I861 Scrotal varices: Secondary | ICD-10-CM | POA: Diagnosis not present

## 2021-01-07 DIAGNOSIS — I700 Atherosclerosis of aorta: Secondary | ICD-10-CM | POA: Diagnosis not present

## 2021-01-07 DIAGNOSIS — K5750 Diverticulosis of both small and large intestine without perforation or abscess without bleeding: Secondary | ICD-10-CM | POA: Diagnosis not present

## 2021-01-07 DIAGNOSIS — N503 Cyst of epididymis: Secondary | ICD-10-CM | POA: Diagnosis not present

## 2021-01-07 DIAGNOSIS — Z79899 Other long term (current) drug therapy: Secondary | ICD-10-CM | POA: Diagnosis not present

## 2021-01-07 DIAGNOSIS — Z7982 Long term (current) use of aspirin: Secondary | ICD-10-CM | POA: Insufficient documentation

## 2021-01-07 LAB — CBC WITH DIFFERENTIAL/PLATELET
Abs Immature Granulocytes: 0.04 10*3/uL (ref 0.00–0.07)
Basophils Absolute: 0 10*3/uL (ref 0.0–0.1)
Basophils Relative: 0 %
Eosinophils Absolute: 0.2 10*3/uL (ref 0.0–0.5)
Eosinophils Relative: 2 %
HCT: 44.8 % (ref 39.0–52.0)
Hemoglobin: 15.8 g/dL (ref 13.0–17.0)
Immature Granulocytes: 0 %
Lymphocytes Relative: 22 %
Lymphs Abs: 2.4 10*3/uL (ref 0.7–4.0)
MCH: 31.3 pg (ref 26.0–34.0)
MCHC: 35.3 g/dL (ref 30.0–36.0)
MCV: 88.9 fL (ref 80.0–100.0)
Monocytes Absolute: 0.6 10*3/uL (ref 0.1–1.0)
Monocytes Relative: 5 %
Neutro Abs: 7.8 10*3/uL — ABNORMAL HIGH (ref 1.7–7.7)
Neutrophils Relative %: 71 %
Platelets: 170 10*3/uL (ref 150–400)
RBC: 5.04 MIL/uL (ref 4.22–5.81)
RDW: 12.9 % (ref 11.5–15.5)
WBC: 11 10*3/uL — ABNORMAL HIGH (ref 4.0–10.5)
nRBC: 0 % (ref 0.0–0.2)

## 2021-01-07 LAB — URINALYSIS, ROUTINE W REFLEX MICROSCOPIC
Bilirubin Urine: NEGATIVE
Glucose, UA: NEGATIVE mg/dL
Hgb urine dipstick: NEGATIVE
Ketones, ur: NEGATIVE mg/dL
Leukocytes,Ua: NEGATIVE
Nitrite: NEGATIVE
Protein, ur: NEGATIVE mg/dL
Specific Gravity, Urine: 1.012 (ref 1.005–1.030)
pH: 5 (ref 5.0–8.0)

## 2021-01-07 LAB — COMPREHENSIVE METABOLIC PANEL
ALT: 49 U/L — ABNORMAL HIGH (ref 0–44)
AST: 42 U/L — ABNORMAL HIGH (ref 15–41)
Albumin: 4.8 g/dL (ref 3.5–5.0)
Alkaline Phosphatase: 55 U/L (ref 38–126)
Anion gap: 11 (ref 5–15)
BUN: 13 mg/dL (ref 6–20)
CO2: 25 mmol/L (ref 22–32)
Calcium: 9.5 mg/dL (ref 8.9–10.3)
Chloride: 103 mmol/L (ref 98–111)
Creatinine, Ser: 0.82 mg/dL (ref 0.61–1.24)
GFR, Estimated: >60 mL/min (ref >60)
Glucose, Bld: 93 mg/dL (ref 70–99)
Potassium: 3.6 mmol/L (ref 3.5–5.1)
Sodium: 139 mmol/L (ref 135–145)
Total Bilirubin: 1.3 mg/dL — ABNORMAL HIGH (ref 0.3–1.2)
Total Protein: 7.9 g/dL (ref 6.5–8.1)

## 2021-01-07 MED ORDER — IBUPROFEN 600 MG PO TABS
600.0000 mg | ORAL_TABLET | Freq: Four times a day (QID) | ORAL | 0 refills | Status: DC | PRN
Start: 2021-01-07 — End: 2021-02-14

## 2021-01-07 MED ORDER — FENTANYL CITRATE (PF) 100 MCG/2ML IJ SOLN
100.0000 ug | Freq: Once | INTRAMUSCULAR | Status: AC
  Administered 2021-01-07: 100 ug via INTRAVENOUS
  Filled 2021-01-07: qty 2

## 2021-01-07 MED ORDER — KETOROLAC TROMETHAMINE 30 MG/ML IJ SOLN
30.0000 mg | Freq: Once | INTRAMUSCULAR | Status: AC
  Administered 2021-01-07: 30 mg via INTRAVENOUS
  Filled 2021-01-07: qty 1

## 2021-01-07 MED ORDER — SODIUM CHLORIDE 0.9 % IV BOLUS
1000.0000 mL | Freq: Once | INTRAVENOUS | Status: AC
  Administered 2021-01-07: 1000 mL via INTRAVENOUS

## 2021-01-07 MED ORDER — FENTANYL CITRATE (PF) 100 MCG/2ML IJ SOLN
50.0000 ug | Freq: Once | INTRAMUSCULAR | Status: AC
  Administered 2021-01-07: 50 ug via INTRAMUSCULAR
  Filled 2021-01-07: qty 2

## 2021-01-07 MED ORDER — ONDANSETRON HCL 4 MG PO TABS
4.0000 mg | ORAL_TABLET | Freq: Four times a day (QID) | ORAL | 0 refills | Status: DC
Start: 2021-01-07 — End: 2021-02-14

## 2021-01-07 NOTE — ED Triage Notes (Signed)
Patient reports he was urinating today and sudden pain developed in his right testicle. Pain rated 8/10. Went to urgent care but urgent care sent patient to ED to r/o possible testicular torsion.

## 2021-01-07 NOTE — Discharge Instructions (Signed)
You may alternate taking Tylenol and Ibuprofen as needed for pain control. You may take 400-600 mg of ibuprofen every 6 hours and [phone] mg of Tylenol every 6 hours. Do not exceed 4000 mg of Tylenol daily as this can lead to liver damage. Also, make sure to take Ibuprofen with meals as it can cause an upset stomach. Do not take other NSAIDs while taking Ibuprofen such as (Aleve, Naprosyn, Aspirin, Celebrex, etc) and do not take more than the prescribed dose as this can lead to ulcers and bleeding in your GI tract. You may use warm and cold compresses to help with your symptoms.   You were given a prescription for zofran to help with your nausea. Please take as directed.  Please follow up with your primary doctor within the next 7-10 days for re-evaluation and further treatment of your symptoms.   Please return to the ER sooner if you have any new or worsening symptoms.

## 2021-01-07 NOTE — ED Provider Notes (Signed)
[facility] DEPT Provider Note   CSN: [id number] Arrival date & time: 01/07/21  1751     History Chief Complaint  Patient presents with  . Testicle Pain    [name] is a 55 y.o. male.  HPI   55 year old male presenting to the emergency department today for evaluation of left testicle pain.  States that he was cleaning out his garage earlier today when he walked to go to the restroom had an episode of dark urine and then sudden onset of left testicular pain that radiated up to the left groin.  He was seen at urgent care prior to arrival and sent here for rule out testicular torsion.  He denies any recent dysuria, frequency, urgency nausea or vomiting.  He has had several kidney stones in the past.  He has not had any recent fevers or trauma.  Past Medical History:  Diagnosis Date  . Abnormal findings on diagnostic imaging of lung 05/20/2019   05/08/2019-CTA chest- no evidence of PE, bilateral airspace densities most consistent with multifocal pneumonia, cardiomegaly and advanced three-vessel coronary vascular calcification, mildly enlarged left hilar and subcarinal lymph nodes  . Abnormal liver function 05/07/2019  . Acute pain of left knee 06/05/2017  . Adenomatous polyp   . Asthma    Dr. Laurann Montana  . CAD (coronary artery disease)   . Chronic respiratory failure with hypoxia ([place]) 05/20/2019  . Cough variant asthma vs recurrent upper airway cough syndrome 10/16/2013   post bronchitic or related to reflux -  07/26/2014 p extensive coaching HFA effectiveness =    90%  - Allergy profile 07/02/16  >  Eos 0.1 /  IgE 22  RAST neg    . COVID-19 virus infection 05/07/2019  . Elevated liver function tests   . Elevated troponin 05/07/2019  . Essential hypertension 07/06/2016  . GERD (gastroesophageal reflux disease) 10/15/2016  . Heart murmur   . Hemoptysis 03/12/2019   03/10/2019-CBC-hemoglobin 16.2  03/10/2019-CTA chest- examination of PE was limited by breath and  motion, particularly in lung bases.  Within this limitation no evidence of PE through the proximal segmental pulmonary arterial level, CAD   . Hepatic steatosis   . Hiatal hernia   . Hyperlipidemia   . Hypertension   . Hypertensive disorder 10/15/2016  . Kidney stones   . Low testosterone   . Morbid obesity due to excess calories ([place]) 07/06/2016  . Nephrolithiasis   . Obesity   . OSA (obstructive sleep apnea)   . Rash 08/02/2014  . Renal disorder   . Rotator cuff disorder   . Sinobronchitis 07/26/2014  . Smokeless tobacco use 08/15/2018    Patient Active Problem List   Diagnosis Date Noted  . Coronary artery disease 01/25/2020  . OSA (obstructive sleep apnea) 05/20/2019  . Chronic respiratory failure with hypoxia ([place]) 05/20/2019  . Abnormal findings on diagnostic imaging of lung 05/20/2019  . COVID-19 virus infection 05/07/2019  . Abnormal liver function 05/07/2019  . Elevated troponin 05/07/2019  . Hemoptysis 03/12/2019  . Smokeless tobacco use 08/15/2018  . Acute pain of left knee 06/05/2017  . GERD (gastroesophageal reflux disease) 10/15/2016  . Hypertensive disorder 10/15/2016  . Morbid obesity due to excess calories ([place]) 07/06/2016  . Essential hypertension 07/06/2016  . Rash 08/02/2014  . Sinobronchitis 07/26/2014  . Cough variant asthma vs recurrent upper airway cough syndrome 10/16/2013    Past Surgical History:  Procedure Laterality Date  . LEFT HEART CATH AND CORONARY ANGIOGRAPHY N/A 01/25/2020  Procedure: LEFT HEART CATH AND CORONARY ANGIOGRAPHY;  Surgeon: Nelva Bush, MD;  Location: [facility];  Service: Cardiovascular;  Laterality: N/A;       Family History  Problem Relation Age of Onset  . Hypertension Mother   . Colon cancer Mother 2  . GER disease Mother   . Transient ischemic attack Mother 48  . Heart disease Father 72  . Diabetes Father   . Heart failure Father 56  . CAD Father   . Cancer - Colon Maternal Grandmother   . Cancer  - Colon Maternal Grandfather   . CAD Paternal Grandmother   . CAD Paternal Grandfather   . Cancer - Colon Maternal Aunt     Social History   Tobacco Use  . Smoking status: Former Smoker    Packs/day: 1.00    Years: 1.00    Pack years: 1.00    Types: Cigarettes    Quit date: 08/14/1983    Years since quitting: 37.4  . Smokeless tobacco: Current User    Types: Chew  . Tobacco comment: dip  Substance Use Topics  . Alcohol use: Yes    Alcohol/week: 0.0 standard drinks    Comment: occ.  . Drug use: No    Home Medications Prior to Admission medications   Medication Sig Start Date End Date Taking? Authorizing Provider  ibuprofen (ADVIL) 600 MG tablet Take 1 tablet (600 mg total) by mouth every 6 (six) hours as needed. 01/07/21  Yes Yanisa Goodgame S, PA-C  ondansetron (ZOFRAN) 4 MG tablet Take 1 tablet (4 mg total) by mouth every 6 (six) hours. 01/07/21  Yes Shaundra Fullam S, PA-C  albuterol (PROAIR HFA) 108 (90 Base) MCG/ACT inhaler Inhale 2 puffs into the lungs every 6 (six) hours as needed for wheezing or shortness of breath. 05/12/19   Thurnell Lose, MD  aspirin 81 MG chewable tablet Chew 1 tablet (81 mg total) by mouth daily. 05/13/19   Thurnell Lose, MD  calcium carbonate (TUMS - DOSED IN MG ELEMENTAL CALCIUM) 500 MG chewable tablet Chew 2 tablets by mouth daily as needed for indigestion or heartburn.    [provider]  Cholecalciferol (D3-1000 PO) Take by mouth daily.    [provider]  esomeprazole (NEXIUM) 40 MG capsule Take 40 mg by mouth See admin instructions. Take 40 mg daily, may take a second dose as needed for acid reflux    [provider]  latanoprost (XALATAN) 0.005 % ophthalmic solution Place 1 drop into both eyes at bedtime.  02/03/19   [provider]  losartan-hydrochlorothiazide (HYZAAR) 50-12.5 MG tablet Take 1 tablet by mouth daily.    [provider]  Multiple Vitamin (MULTIVITAMIN) tablet Take 1 tablet by  mouth daily.    [provider]  rosuvastatin (CRESTOR) 20 MG tablet Take 1 tablet (20 mg total) by mouth daily. 08/15/20   Jerline Pain, MD  sodium chloride (OCEAN) 0.65 % SOLN nasal spray Place 1 spray into both nostrils as needed for congestion.    [provider]  zinc gluconate 50 MG tablet Take 50 mg by mouth daily.    [provider]    Allergies    Clindamycin/lincomycin, Lisinopril, Neosporin [neomycin-bacitracin zn-polymyx], Penicillins, and Tramadol hcl  Review of Systems   Review of Systems  Constitutional: Negative for fever.  HENT: Negative for ear pain and sore throat.   Eyes: Negative for visual disturbance.  Respiratory: Negative for cough and shortness of breath.  Cardiovascular: Negative for chest pain.  Gastrointestinal: Negative for abdominal pain, constipation, diarrhea, nausea and vomiting.  Genitourinary: Positive for testicular pain. Negative for dysuria, hematuria, penile swelling and scrotal swelling.  Musculoskeletal: Negative for back pain.  Skin: Negative for rash.  Neurological: Negative for seizures and syncope.  All other systems reviewed and are negative.   Physical Exam Updated Vital Signs BP 115/80   Pulse 67   Temp 98.1 F (36.7 C) (Oral)   Resp 16   Ht 5\' 9"  (1.753 m)   Wt 112.9 kg   SpO2 95%   BMI 36.77 kg/m   Physical Exam Vitals and nursing note reviewed.  Constitutional:      Appearance: He is well-developed.  HENT:     Head: Normocephalic and atraumatic.  Eyes:     Conjunctiva/sclera: Conjunctivae normal.  Cardiovascular:     Rate and Rhythm: Normal rate and regular rhythm.     Heart sounds: Normal heart sounds. No murmur heard.   Pulmonary:     Effort: Pulmonary effort is normal. No respiratory distress.     Breath sounds: Normal breath sounds. No wheezing, rhonchi or rales.  Abdominal:     General: Bowel sounds are normal.     Palpations: Abdomen is soft.     Tenderness: There is abdominal  tenderness (left inguinal ttp). There is no guarding or rebound.  Genitourinary:    Comments: Chaperone present. ttp to the left testicle. No erythema, warmth or skin changes. No obvious hernia Musculoskeletal:     Cervical back: Neck supple.  Skin:    General: Skin is warm and dry.  Neurological:     Mental Status: He is alert.     ED Results / Procedures / Treatments   Labs (all labs ordered are listed, but only abnormal results are displayed) Labs Reviewed  CBC WITH DIFFERENTIAL/PLATELET - Abnormal; Notable for the following components:      Result Value   WBC 11.0 (*)    Neutro Abs 7.8 (*)    All other components within normal limits  COMPREHENSIVE METABOLIC PANEL - Abnormal; Notable for the following components:   AST 42 (*)    ALT 49 (*)    Total Bilirubin 1.3 (*)    All other components within normal limits  URINALYSIS, ROUTINE W REFLEX MICROSCOPIC    EKG None  Radiology CT Renal Stone Study  Result Date: 01/07/2021 CLINICAL DATA:  Left testicular pain since 1600 today EXAM: CT ABDOMEN AND PELVIS WITHOUT CONTRAST TECHNIQUE: Multidetector CT imaging of the abdomen and pelvis was performed following the standard protocol without IV contrast. COMPARISON:  01/07/2021, 10/17/2015 FINDINGS: Lower chest: No acute pleural or parenchymal lung disease. Stable benign 5 mm left lower lobe pulmonary nodule, unchanged since 2017. No follow-up is required. Hepatobiliary: Unenhanced imaging of the liver and gallbladder demonstrate no focal abnormalities. No intrahepatic duct dilation. Pancreas: Unremarkable. No pancreatic ductal dilatation or surrounding inflammatory changes. Spleen: Normal in size without focal abnormality. Adrenals/Urinary Tract: There is a stable nonobstructing 4 mm calculus within the lower pole right kidney. No left-sided calculi. No obstructive uropathy within either kidney. The adrenals and bladder are unremarkable. Stomach/Bowel: No bowel obstruction or ileus.  Scattered diverticulosis of the sigmoid and descending colon without diverticulitis. Normal appendix right lower quadrant. No bowel wall thickening or inflammatory change. Vascular/Lymphatic: Aortic atherosclerosis. No enlarged abdominal or pelvic lymph nodes. Reproductive: Prostate is unremarkable. Other: No free fluid or free gas.  No abdominal wall hernia. Musculoskeletal: No acute or destructive  bony lesions. Reconstructed images demonstrate no additional findings. IMPRESSION: 1. Nonobstructing 4 mm right renal calculus. 2. Distal colonic diverticulosis without diverticulitis. 3.  Aortic Atherosclerosis (ICD10-I70.0). Electronically Signed   By: Randa Ngo M.D.   On: 01/07/2021 20:29   US SCROTUM W/DOPPLER  Result Date: 01/07/2021 CLINICAL DATA:  Left testicular pain for several hours, possible torsion EXAM: SCROTAL ULTRASOUND DOPPLER ULTRASOUND OF THE TESTICLES TECHNIQUE: Complete ultrasound examination of the testicles, epididymis, and other scrotal structures was performed. Color and spectral Doppler ultrasound were also utilized to evaluate blood flow to the testicles. COMPARISON:  None. FINDINGS: Right testicle Measurements: 4.1 x 2.5 x 3.3 cm. No mass or microlithiasis visualized. Left testicle Measurements: 3.6 x 2.5 x 3.1 cm. No mass or microlithiasis visualized. Right epididymis:  6 mm epididymal cyst is noted. Left epididymis:  Normal in size and appearance. Hydrocele:  Bilateral small hydroceles are noted. Varicocele:  Bilateral small varicoceles are seen. Pulsed Doppler interrogation of both testes demonstrates normal low resistance arterial and venous waveforms bilaterally. IMPRESSION: No evidence of testicular torsion. Small bilateral hydroceles and varicoceles. Right epididymal cyst. Normal-appearing testicles. Electronically Signed   By: Inez Catalina M.D.   On: 01/07/2021 19:34    Procedures Procedures   Medications Ordered in ED Medications  fentaNYL (SUBLIMAZE) injection 100 mcg  (has no administration in time range)  fentaNYL (SUBLIMAZE) injection 50 mcg (50 mcg Intramuscular Given 01/07/21 1847)  ketorolac (TORADOL) 30 MG/ML injection 30 mg (30 mg Intravenous Given 01/07/21 2044)  sodium chloride 0.9 % bolus 1,000 mL (1,000 mLs Intravenous New Bag/Given (Non-Interop) 01/07/21 2044)    ED Course  I have reviewed the triage vital signs and the nursing notes.  Pertinent labs & imaging results that were available during my care of the patient were reviewed by me and considered in my medical decision making (see chart for details).    MDM Rules/Calculators/A&P                          55 y/o m presenting for eval of left testicular pain  Reviewed/interpreted labs CBC with mild leukocytosis, otherwise reassuring CMP with normal kidney function, LFTs are slightly elevated but this appears chronic UA negative  Testicular US - No evidence of testicular torsion. Small bilateral hydroceles and varicoceles. Right epididymal cyst. Normal-appearing testicles CT renal - 1. Nonobstructing 4 mm right renal calculus. 2. Distal colonic diverticulosis without diverticulitis. 3.  Aortic Atherosclerosis   Patient with reassuring work-up here.  His pain is significantly improved after pain medications here in the ED.  Given his reassuring work-up, I have low suspicion for any emergent cause of symptoms.  I do question whether or not he may have passed a stone prior to arrival given his frequent history of nephrolithiasis and pain radiating from left testicle to the left groin.  He reported some left flank pain few days ago as well so this may be the case. Additionally, on review of the urgent care note pt stated that he may have hit his testicle with his pants when using the restroom so this may also be contributing to his pain.  I will discharge him with short course of anti-inflammatory medications for his symptoms as well as some nausea medications.  Have discussed the possibility of  intermittent torsion and have advised on close monitoring of symptoms and strict return precautions for any new or worsening symptoms.  He voices understanding of this plan and reasons to return.  All questions answered.  Patient stable for discharge.  Final Clinical Impression(s) / ED Diagnoses Final diagnoses:  Left testicular pain    Rx / DC Orders ED Discharge Orders         Ordered    ibuprofen (ADVIL) 600 MG tablet  Every 6 hours PRN        01/07/21 2157    ondansetron (ZOFRAN) 4 MG tablet  Every 6 hours        01/07/21 2157           Bishop Dublin 01/07/21 2157    Dorie Rank, MD 01/07/21 2201

## 2021-01-07 NOTE — ED Provider Notes (Signed)
Emergency Medicine Provider Triage Evaluation Note  [name] , a 55 y.o. male  was evaluated in triage.  Pt complains of left testicular pain. States he tried to urinate today and had sudden onset of left testicular pain. Denies h/o same. Denies recent urinary sxs or fevers.  Review of Systems  Positive: Left testicle pain Negative: Dysuria, frequency  Physical Exam  BP (!) 142/90 (BP Location: Left Arm)   Pulse 80   Temp 98.1 F (36.7 C) (Oral)   Resp 18   Ht 5\' 9"  (1.753 m)   Wt 112.9 kg   SpO2 96%   BMI 36.77 kg/m  Gen:   Awake, no distress   Resp:  Normal effort  MSK:   Moves extremities without difficulty  Other:  Chaperone present, Left testicle is exquisitely ttp, I am no able to appreciate a cremasteric reflex, no erythema/swelling or obvious hernia  Medical Decision Making  Medically screening exam initiated at 6:37 PM.  Appropriate orders placed.  [name] was informed that the remainder of the evaluation will be completed by another provider, this initial triage assessment does not replace that evaluation, and the importance of remaining in the ED until their evaluation is complete.     [name] 01/07/21 1850    Allen Rank, MD 01/07/21 2159

## 2021-02-03 DIAGNOSIS — J019 Acute sinusitis, unspecified: Secondary | ICD-10-CM | POA: Diagnosis not present

## 2021-02-13 NOTE — Progress Notes (Signed)
H&P  Chief Complaint: Follow-up for left testicular pain  History of Present Illness: 55 year old male with prior history of urolithiasis presents for follow-up of an emergency room visit on 01/07/2021.  He presented there with sudden onset of left testicular pain.  This was not associated with changes in urinary pattern, dysuria, gross hematuria or flank pain.  It was felt that he had possible passed kidney stone.  He was sent home.  It took about 2 weeks for his pain to totally go away.  He did have testicular tenderness on the left.  He had no right-sided testicular symptoms.  He has no prior history of similar symptoms.  Past Medical History:  Diagnosis Date   Abnormal findings on diagnostic imaging of lung 05/20/2019   05/08/2019-CTA chest- no evidence of PE, bilateral airspace densities most consistent with multifocal pneumonia, cardiomegaly and advanced three-vessel coronary vascular calcification, mildly enlarged left hilar and subcarinal lymph nodes   Abnormal liver function 05/07/2019   Acute pain of left knee 06/05/2017   Adenomatous polyp    Asthma    Dr. Laurann Montana   CAD (coronary artery disease)    Chronic respiratory failure with hypoxia ([place]) 05/20/2019   Cough variant asthma vs recurrent upper airway cough syndrome 10/16/2013   post bronchitic or related to reflux -  07/26/2014 p extensive coaching HFA effectiveness =    90%  - Allergy profile 07/02/16  >  Eos 0.1 /  IgE 22  RAST neg     COVID-19 virus infection 05/07/2019   Elevated liver function tests    Elevated troponin 05/07/2019   Essential hypertension 07/06/2016   GERD (gastroesophageal reflux disease) 10/15/2016   Heart murmur    Hemoptysis 03/12/2019   03/10/2019-CBC-hemoglobin 16.2  03/10/2019-CTA chest- examination of PE was limited by breath and motion, particularly in lung bases.  Within this limitation no evidence of PE through the proximal segmental pulmonary arterial level, CAD    Hepatic steatosis    Hiatal hernia     Hyperlipidemia    Hypertension    Hypertensive disorder 10/15/2016   Kidney stones    Low testosterone    Morbid obesity due to excess calories ([place]) 07/06/2016   Nephrolithiasis    Obesity    OSA (obstructive sleep apnea)    Rash 08/02/2014   Renal disorder    Rotator cuff disorder    Sinobronchitis 07/26/2014   Smokeless tobacco use 08/15/2018    Past Surgical History:  Procedure Laterality Date   LEFT HEART CATH AND CORONARY ANGIOGRAPHY N/A 01/25/2020   Procedure: LEFT HEART CATH AND CORONARY ANGIOGRAPHY;  Surgeon: Nelva Bush, MD;  Location: [facility];  Service: Cardiovascular;  Laterality: N/A;    Home Medications:  Allergies as of 02/14/2021       Reactions   Clindamycin/lincomycin    Unknown reaction   Lisinopril Cough   Neosporin [neomycin-bacitracin Zn-polymyx]    Blisters skin   Penicillins Swelling   Did it involve swelling of the face/tongue/throat, SOB, or low BP?Yes Did it involve sudden or severe rash/hives, skin peeling, or any reaction on the inside of your mouth or nose? Unknown Did you need to seek medical attention at a hospital or doctor's office? Unknown When did it last happen?      Childhood  If all above answers are "NO", may proceed with cephalosporin use.   Tramadol Hcl Hives, Itching        Medication List        Accurate as of  February 13, 2021  5:08 PM. If you have any questions, ask your nurse or doctor.          albuterol 108 (90 Base) MCG/ACT inhaler Commonly known as: ProAir HFA Inhale 2 puffs into the lungs every 6 (six) hours as needed for wheezing or shortness of breath.   aspirin 81 MG chewable tablet Chew 1 tablet (81 mg total) by mouth daily.   calcium carbonate 500 MG chewable tablet Commonly known as: TUMS - dosed in mg elemental calcium Chew 2 tablets by mouth daily as needed for indigestion or heartburn.   D3-1000 PO Take by mouth daily.   esomeprazole 40 MG capsule Commonly known as: NEXIUM Take 40 mg  by mouth See admin instructions. Take 40 mg daily, may take a second dose as needed for acid reflux   ibuprofen 600 MG tablet Commonly known as: ADVIL Take 1 tablet (600 mg total) by mouth every 6 (six) hours as needed.   latanoprost 0.005 % ophthalmic solution Commonly known as: XALATAN Place 1 drop into both eyes at bedtime.   losartan-hydrochlorothiazide 50-12.5 MG tablet Commonly known as: HYZAAR Take 1 tablet by mouth daily.   multivitamin tablet Take 1 tablet by mouth daily.   ondansetron 4 MG tablet Commonly known as: ZOFRAN Take 1 tablet (4 mg total) by mouth every 6 (six) hours.   rosuvastatin 20 MG tablet Commonly known as: CRESTOR Take 1 tablet (20 mg total) by mouth daily.   sodium chloride 0.65 % Soln nasal spray Commonly known as: OCEAN Place 1 spray into both nostrils as needed for congestion.   zinc gluconate 50 MG tablet Take 50 mg by mouth daily.        Allergies:  Allergies  Allergen Reactions   Clindamycin/Lincomycin     Unknown reaction   Lisinopril Cough   Neosporin [Neomycin-Bacitracin Zn-Polymyx]     Blisters skin   Penicillins Swelling    Did it involve swelling of the face/tongue/throat, SOB, or low BP?Yes Did it involve sudden or severe rash/hives, skin peeling, or any reaction on the inside of your mouth or nose? Unknown Did you need to seek medical attention at a hospital or doctor's office? Unknown When did it last happen?      Childhood  If all above answers are "NO", may proceed with cephalosporin use.     Tramadol Hcl Hives and Itching    Family History  Problem Relation Age of Onset   Hypertension Mother    Colon cancer Mother 35   GER disease Mother    Transient ischemic attack Mother 68   Heart disease Father 42   Diabetes Father    Heart failure Father 38   CAD Father    Cancer - Colon Maternal Grandmother    Cancer - Colon Maternal Grandfather    CAD Paternal Grandmother    CAD Paternal Grandfather    Cancer -  Colon Maternal Aunt     Social History:  reports that he quit smoking about 37 years ago. His smoking use included cigarettes. He has a 1.00 pack-year smoking history. His smokeless tobacco use includes chew. He reports current alcohol use. He reports that he does not use drugs.  ROS: A complete review of systems was performed.  All systems are negative except for pertinent findings as noted.  Physical Exam:  Vital signs in last 24 hours: There were no vitals taken for this visit. Constitutional:  Alert and oriented, No acute distress Cardiovascular: Regular rate  Respiratory:  Normal respiratory effort GI: Abdomen is soft, nontender, nondistended, no abdominal masses. No CVAT.  His abdomen is obese, there are no inguinal hernias. Genitourinary: Normal male phallus, testes are descended bilaterally and non-tender and without masses, scrotum is normal in appearance without lesions or masses, perineum is normal on inspection. Lymphatic: No lymphadenopathy Neurologic: Grossly intact, no focal deficits Psychiatric: Normal mood and affect  I have reviewed prior pt notes  I have reviewed notes from referring/previous physicians (emergency room visit)  I have reviewed urinalysis results  I have independently reviewed prior imaging--CT images from 2022 as well as 2016.  He does have a small remaining right lower pole calculus.  He also had scrotal ultrasound performed, images were reviewed.   Impression/Assessment:  Left testicular pain, resolved.  He did have tenderness at that time.  I do not think he necessarily had a kidney stone.  He may have had a torsion of his appendix testicle versus mild epididymitis, noninfectious.  Plan:  I reassured him about his exam and the fact that he probably had a self-limited process  I will have him come back as needed+

## 2021-02-14 ENCOUNTER — Other Ambulatory Visit: Payer: Self-pay

## 2021-02-14 ENCOUNTER — Encounter: Payer: Self-pay | Admitting: Urology

## 2021-02-14 ENCOUNTER — Ambulatory Visit: Payer: BC Managed Care – PPO | Admitting: Urology

## 2021-02-14 VITALS — BP 137/89 | HR 71

## 2021-02-14 DIAGNOSIS — N200 Calculus of kidney: Secondary | ICD-10-CM | POA: Diagnosis not present

## 2021-02-14 DIAGNOSIS — N50819 Testicular pain, unspecified: Secondary | ICD-10-CM

## 2021-02-14 LAB — URINALYSIS, ROUTINE W REFLEX MICROSCOPIC
Bilirubin, UA: NEGATIVE
Glucose, UA: NEGATIVE
Ketones, UA: NEGATIVE
Leukocytes,UA: NEGATIVE
Nitrite, UA: NEGATIVE
Protein,UA: NEGATIVE
RBC, UA: NEGATIVE
Specific Gravity, UA: 1.025 (ref 1.005–1.030)
Urobilinogen, Ur: 0.2 mg/dL (ref 0.2–1.0)
pH, UA: 6 (ref 5.0–7.5)

## 2021-02-14 NOTE — Progress Notes (Signed)
Urological Symptom Review  Patient is experiencing the following symptoms: none   Review of Systems  Gastrointestinal (upper)  : Indigestion/heartburn  Gastrointestinal (lower) : Negative for lower GI symptoms  Constitutional : Negative for symptoms  Skin: Negative for skin symptoms  Eyes: Negative for eye symptoms  Ear/Nose/Throat : Sinus problems  Hematologic/Lymphatic: Negative for Hematologic/Lymphatic symptoms  Cardiovascular : Negative for cardiovascular symptoms  Respiratory : Negative for respiratory symptoms  Endocrine: Negative for endocrine symptoms  Musculoskeletal: Negative for musculoskeletal symptoms  Neurological: Negative for neurological symptoms  Psychologic: Negative for psychiatric symptoms

## 2021-03-03 DIAGNOSIS — H401131 Primary open-angle glaucoma, bilateral, mild stage: Secondary | ICD-10-CM | POA: Diagnosis not present

## 2021-03-07 ENCOUNTER — Ambulatory Visit: Payer: BC Managed Care – PPO | Admitting: Urology

## 2021-04-12 DIAGNOSIS — S83241A Other tear of medial meniscus, current injury, right knee, initial encounter: Secondary | ICD-10-CM | POA: Diagnosis not present

## 2021-04-12 DIAGNOSIS — S83001A Unspecified subluxation of right patella, initial encounter: Secondary | ICD-10-CM | POA: Diagnosis not present

## 2021-04-24 ENCOUNTER — Ambulatory Visit: Payer: BC Managed Care – PPO | Admitting: Cardiology

## 2021-04-24 ENCOUNTER — Encounter: Payer: Self-pay | Admitting: Cardiology

## 2021-04-24 ENCOUNTER — Other Ambulatory Visit: Payer: Self-pay

## 2021-04-24 DIAGNOSIS — K219 Gastro-esophageal reflux disease without esophagitis: Secondary | ICD-10-CM

## 2021-04-24 DIAGNOSIS — E7800 Pure hypercholesterolemia, unspecified: Secondary | ICD-10-CM | POA: Diagnosis not present

## 2021-04-24 DIAGNOSIS — Z8249 Family history of ischemic heart disease and other diseases of the circulatory system: Secondary | ICD-10-CM | POA: Insufficient documentation

## 2021-04-24 DIAGNOSIS — I2510 Atherosclerotic heart disease of native coronary artery without angina pectoris: Secondary | ICD-10-CM | POA: Diagnosis not present

## 2021-04-24 DIAGNOSIS — E785 Hyperlipidemia, unspecified: Secondary | ICD-10-CM | POA: Insufficient documentation

## 2021-04-24 NOTE — Assessment & Plan Note (Signed)
Cardiac catheterization on 01/25/2020 once again reviewed showing moderate to severe single-vessel coronary disease with focal 70% mid LAD stenosis just beyond the takeoff of diagonal 2.  There is significant coronary calcification from previous CT scan in the 4000 range. - Pharmacologic stress test did not show any evidence of high risk ischemia.  Overall reassuring. - Continue with aggressive goal-directed medical therapy which includes Crestor 20 mg a day high intensity dose.  Most recent LDL 56.  Continue with aspirin 81.  No anginal symptoms.

## 2021-04-24 NOTE — Patient Instructions (Signed)
Medication Instructions:  The current medical regimen is effective;  continue present plan and medications.  *If you need a refill on your cardiac medications before your next appointment, please call your pharmacy*  Follow-Up: At [facility], you and your health needs are our priority.  As part of our continuing mission to provide you with exceptional heart care, we have created designated Provider Care Teams.  These Care Teams include your primary Cardiologist (physician) and Advanced Practice Providers (APPs -  Physician Assistants and Nurse Practitioners) who all work together to provide you with the care you need, when you need it.  We recommend signing up for the patient portal called "MyChart".  Sign up information is provided on this After Visit Summary.  MyChart is used to connect with patients for Virtual Visits (Telemedicine).  Patients are able to view lab/test results, encounter notes, upcoming appointments, etc.  Non-urgent messages can be sent to your provider as well.   To learn more about what you can do with MyChart, go to [email or web address].    Your next appointment:   1 year(s)  The format for your next appointment:   In Person  Provider:   Mark Skains, MD   Thank you for choosing [facility]!!    

## 2021-04-24 NOTE — Assessment & Plan Note (Signed)
Mother has CAD post coronary stent.

## 2021-04-24 NOTE — Assessment & Plan Note (Signed)
Currently on high intensity statin dose Crestor 20 mg once a day.  No changes made.  No myalgias.  LDL 56.  Refill medications as needed.

## 2021-04-24 NOTE — Progress Notes (Signed)
Cardiology Office Note:    Date:  04/24/2021   ID:  [name], DOB 18-Jan-1966, MRN [id number]  PCP:  Lavone Orn, MD   [place]  Cardiologist:  Candee Furbish, MD  Advanced Practice Provider:  No care team member to display Electrophysiologist:  None       Referring MD: Lavone Orn, MD     History of Present Illness:    [name] is a 55 y.o. male here for follow-up of coronary artery disease.  LAD disease in the setting of severely elevated calcium score.  Upon review of cardiac catheterization, there was a small focal LAD lesion that does appear to be more significantly stenotic than 70%.  Distal to this lesion however the remainder of the LAD is quite diffusely diseased and small in size approximately 2 mm as reported.  Doing quite well.  He does not seem to have any significant anginal symptoms when going uphill or stairs for instance.  He still has chest discomfort at times when eating certain foods.  Currently not having any fever chills nausea vomiting syncope bleeding.  He is here for a 6 month f/u  Today, he mentions he is doing well.  He mentions he has gained a little weight and plans on working on that.  He has been managing his pressure and cholesterol levels with his medication and is doing well on them. Lab Results  Component Value Date   CHOL 112 05/02/2020   HDL 36 (L) 05/02/2020   LDLCALC 56 05/02/2020   TRIG 104 05/02/2020   CHOLHDL 3.1 05/02/2020     He denies chest pain, shortness of breath, palpitations, lightheadedness, headaches, syncope, LE edema, orthopnea, PND.   Past Medical History:  Diagnosis Date   Abnormal findings on diagnostic imaging of lung 05/20/2019   05/08/2019-CTA chest- no evidence of PE, bilateral airspace densities most consistent with multifocal pneumonia, cardiomegaly and advanced three-vessel coronary vascular calcification, mildly enlarged left hilar and subcarinal lymph nodes   Abnormal  liver function 05/07/2019   Acute pain of left knee 06/05/2017   Adenomatous polyp    Asthma    Dr. Laurann Montana   CAD (coronary artery disease)    Chronic respiratory failure with hypoxia ([place]) 05/20/2019   Cough variant asthma vs recurrent upper airway cough syndrome 10/16/2013   post bronchitic or related to reflux -  07/26/2014 p extensive coaching HFA effectiveness =    90%  - Allergy profile 07/02/16  >  Eos 0.1 /  IgE 22  RAST neg     COVID-19 virus infection 05/07/2019   Elevated liver function tests    Elevated troponin 05/07/2019   Essential hypertension 07/06/2016   GERD (gastroesophageal reflux disease) 10/15/2016   Heart murmur    Hemoptysis 03/12/2019   03/10/2019-CBC-hemoglobin 16.2  03/10/2019-CTA chest- examination of PE was limited by breath and motion, particularly in lung bases.  Within this limitation no evidence of PE through the proximal segmental pulmonary arterial level, CAD    Hepatic steatosis    Hiatal hernia    Hyperlipidemia    Hypertension    Hypertensive disorder 10/15/2016   Kidney stones    Low testosterone    Morbid obesity due to excess calories ([place]) 07/06/2016   Nephrolithiasis    Obesity    OSA (obstructive sleep apnea)    Rash 08/02/2014   Renal disorder    Rotator cuff disorder    Sinobronchitis 07/26/2014   Smokeless tobacco use 08/15/2018  Past Surgical History:  Procedure Laterality Date   LEFT HEART CATH AND CORONARY ANGIOGRAPHY N/A 01/25/2020   Procedure: LEFT HEART CATH AND CORONARY ANGIOGRAPHY;  Surgeon: Nelva Bush, MD;  Location: [facility];  Service: Cardiovascular;  Laterality: N/A;    Current Medications: Current Meds  Medication Sig   albuterol (PROAIR HFA) 108 (90 Base) MCG/ACT inhaler Inhale 2 puffs into the lungs every 6 (six) hours as needed for wheezing or shortness of breath.   aspirin 81 MG chewable tablet Chew 1 tablet (81 mg total) by mouth daily.   calcium carbonate (TUMS - DOSED IN MG ELEMENTAL CALCIUM) 500 MG  chewable tablet Chew 2 tablets by mouth daily as needed for indigestion or heartburn.   esomeprazole (NEXIUM) 40 MG capsule Take 40 mg by mouth See admin instructions. Take 40 mg daily, may take a second dose as needed for acid reflux   latanoprost (XALATAN) 0.005 % ophthalmic solution Place 1 drop into both eyes at bedtime.    losartan-hydrochlorothiazide (HYZAAR) 50-12.5 MG tablet Take 1 tablet by mouth daily.   Multiple Vitamin (MULTIVITAMIN) tablet Take 1 tablet by mouth daily.   rosuvastatin (CRESTOR) 20 MG tablet Take 1 tablet (20 mg total) by mouth daily.   sodium chloride (OCEAN) 0.65 % SOLN nasal spray Place 1 spray into both nostrils as needed for congestion.   zinc gluconate 50 MG tablet Take 50 mg by mouth daily.     Allergies:   Clindamycin/lincomycin, Lisinopril, Neosporin [neomycin-bacitracin zn-polymyx], Penicillins, and Tramadol hcl   Social History   Socioeconomic History   Marital status: Married    Spouse name: Not on file   Number of children: 0   Years of education: Not on file   Highest education level: Not on file  Occupational History   Occupation: install door frames    Employer: [employer]  Tobacco Use   Smoking status: Former    Packs/day: 1.00    Years: 1.00    Pack years: 1.00    Types: Cigarettes    Quit date: 08/14/1983    Years since quitting: 37.7   Smokeless tobacco: Current    Types: Chew   Tobacco comments:    dip  Substance and Sexual Activity   Alcohol use: Yes    Alcohol/week: 0.0 standard drinks    Comment: occ.   Drug use: No   Sexual activity: Not on file  Other Topics Concern   Not on file  Social History Narrative   Not on file   Social Determinants of Health   Financial Resource Strain: Not on file  Food Insecurity: Not on file  Transportation Needs: Not on file  Physical Activity: Not on file  Stress: Not on file  Social Connections: Not on file     Family History: The patient's family history  includes CAD in his father, paternal grandfather, and paternal grandmother; Cancer - Colon in his maternal aunt, maternal grandfather, and maternal grandmother; Colon cancer (age of onset: 67) in his mother; Diabetes in his father; GER disease in his mother; Heart disease (age of onset: 54) in his father; Heart failure (age of onset: 71) in his father; Hypertension in his mother; Transient ischemic attack (age of onset: 77) in his mother.  ROS:   Please see the history of present illness.     All other systems reviewed and are negative.   EKGs/Labs/Other Studies Reviewed:    The following studies were reviewed today:  Stress Test 11/11/2020: Carlton Adam strss  is electrcially negative for ischemia Myovue scan shows small, fixed distal inferior and apical defect consistent with scar and / or soft tissue attenuation. NO ischemia. LVEF on gating calculated at 46%. Consider echo to further define LVEF /wall motion Intermediate risk scan Compared to report from previous study (2012) LVEF is down.  Cath as below Calcium score 11/24/2019: The observed calcium score of 4921 is at the percentile 99 for subjects of the same age, gender and race/ethnicity who are free of clinical cardiovascular disease and treated diabetes.  EKG:    10/27/2020: sinus rhythm PVC 66 bpm  Recent Labs: 01/07/2021: ALT 49; BUN 13; Creatinine, Ser 0.82; Hemoglobin 15.8; Platelets 170; Potassium 3.6; Sodium 139  Recent Lipid Panel    Component Value Date/Time   CHOL 112 05/02/2020 0739   TRIG 104 05/02/2020 0739   HDL 36 (L) 05/02/2020 0739   CHOLHDL 3.1 05/02/2020 0739   CHOLHDL 6.1 01/05/2011 0600   VLDL 32 01/05/2011 0600   LDLCALC 56 05/02/2020 0739     Risk Assessment/Calculations:      Physical Exam:    VS:  BP 120/80 (BP Location: Left Arm, Patient Position: Sitting, Cuff Size: Normal)   Pulse 72   Ht '5\' 9"'$  (1.753 m)   Wt 253 lb (114.8 kg)   SpO2 96%   BMI 37.36 kg/m     Wt Readings from Last  3 Encounters:  04/24/21 253 lb (114.8 kg)  01/07/21 249 lb (112.9 kg)  11/11/20 247 lb (112 kg)     GEN:  Well nourished, well developed in no acute distress HEENT: Normal NECK: No JVD; No carotid bruits LYMPHATICS: No lymphadenopathy CARDIAC: RRR, no murmurs, rubs, gallops RESPIRATORY:  Clear to auscultation without rales, wheezing or rhonchi  ABDOMEN: Soft, non-tender, non-distended MUSCULOSKELETAL:  No edema; No deformity  SKIN: Warm and dry NEUROLOGIC:  Alert and oriented x 3 PSYCHIATRIC:  Normal affect   ASSESSMENT:    1. Coronary artery disease involving native coronary artery of native heart without angina pectoris   2. Pure hypercholesterolemia   3. Family history of coronary artery disease   4. Gastroesophageal reflux disease without esophagitis     PLAN:    In order of problems listed above:  Coronary artery disease Cardiac catheterization on 01/25/2020 once again reviewed showing moderate to severe single-vessel coronary disease with focal 70% mid LAD stenosis just beyond the takeoff of diagonal 2.  There is significant coronary calcification from previous CT scan in the 4000 range. - Pharmacologic stress test did not show any evidence of high risk ischemia.  Overall reassuring. - Continue with aggressive goal-directed medical therapy which includes Crestor 20 mg a day high intensity dose.  Most recent LDL 56.  Continue with aspirin 81.  No anginal symptoms.  Hyperlipidemia Currently on high intensity statin dose Crestor 20 mg once a day.  No changes made.  No myalgias.  LDL 56.  Refill medications as needed.  Family history of coronary artery disease Mother has CAD post coronary stent.  GERD (gastroesophageal reflux disease) Has taken Nexium.  Watching food diary.   04/24/2021:  Continue medication regimen  Follow-up in 1 year   Medication Adjustments/Labs and Tests Ordered: Current medicines are reviewed at length with the patient today.  Concerns  regarding medicines are outlined above.  No orders of the defined types were placed in this encounter.  No orders of the defined types were placed in this encounter.   Patient Instructions  Medication Instructions:  The current  medical regimen is effective;  continue present plan and medications.  *If you need a refill on your cardiac medications before your next appointment, please call your pharmacy*  Follow-Up: At [facility], you and your health needs are our priority.  As part of our continuing mission to provide you with exceptional heart care, we have created designated Provider Care Teams.  These Care Teams include your primary Cardiologist (physician) and Advanced Practice Providers (APPs -  Physician Assistants and Nurse Practitioners) who all work together to provide you with the care you need, when you need it.  We recommend signing up for the patient portal called "MyChart".  Sign up information is provided on this After Visit Summary.  MyChart is used to connect with patients for Virtual Visits (Telemedicine).  Patients are able to view lab/test results, encounter notes, upcoming appointments, etc.  Non-urgent messages can be sent to your provider as well.   To learn more about what you can do with MyChart, go to [email or web address].    Your next appointment:   1 year(s)  The format for your next appointment:   In Person  Provider:   Candee Furbish, MD   Thank you for choosing [facility]!!      I,Zite Okoli,acting as a scribe for Candee Furbish, MD.,have documented all relevant documentation on the behalf of Candee Furbish, MD,as directed by  Candee Furbish, MD while in the presence of Candee Furbish, MD.   I, Candee Furbish, MD, have reviewed all documentation for this visit. The documentation on 04/24/21 for the exam, diagnosis, procedures, and orders are all accurate and complete.   Signed, Candee Furbish, MD  04/24/2021 4:37 PM    [place]

## 2021-04-24 NOTE — Assessment & Plan Note (Signed)
Has taken Nexium.  Watching food diary.

## 2021-05-15 ENCOUNTER — Telehealth: Payer: Self-pay | Admitting: Cardiology

## 2021-05-15 MED ORDER — ROSUVASTATIN CALCIUM 20 MG PO TABS: 20.0000 mg | ORAL_TABLET | Freq: Every day | ORAL | 3 refills | Status: DC

## 2021-05-15 NOTE — Telephone Encounter (Signed)
Pt's medication was sent to pt's pharmacy as requested. Confirmation received.  °

## 2021-05-15 NOTE — Telephone Encounter (Signed)
 *  STAT* If patient is at the pharmacy, call can be transferred to refill team.   1. Which medications need to be refilled? (please list name of each medication and dose if known)   rosuvastatin (CRESTOR) 20 MG tablet  2. Which pharmacy/location (including street and city if local pharmacy) is medication to be sent to?  [place], [place] [address]  3. Do they need a 30 day or 90 day supply? 90 day  PATIENT NEEDS REFILL SENT TO [facility] INSTEAD OF [facility]. HE STATES IT WAS TOO EXPENSIVE AT [facility].

## 2021-06-20 DIAGNOSIS — S83241A Other tear of medial meniscus, current injury, right knee, initial encounter: Secondary | ICD-10-CM | POA: Diagnosis not present

## 2021-06-20 DIAGNOSIS — M7551 Bursitis of right shoulder: Secondary | ICD-10-CM | POA: Diagnosis not present

## 2021-07-10 DIAGNOSIS — M25561 Pain in right knee: Secondary | ICD-10-CM | POA: Diagnosis not present

## 2021-07-14 DIAGNOSIS — S83241D Other tear of medial meniscus, current injury, right knee, subsequent encounter: Secondary | ICD-10-CM | POA: Diagnosis not present

## 2021-07-28 DIAGNOSIS — S83241D Other tear of medial meniscus, current injury, right knee, subsequent encounter: Secondary | ICD-10-CM | POA: Diagnosis not present

## 2021-08-03 DIAGNOSIS — D485 Neoplasm of uncertain behavior of skin: Secondary | ICD-10-CM | POA: Diagnosis not present

## 2021-08-03 DIAGNOSIS — D0462 Carcinoma in situ of skin of left upper limb, including shoulder: Secondary | ICD-10-CM | POA: Diagnosis not present

## 2021-08-11 DIAGNOSIS — J018 Other acute sinusitis: Secondary | ICD-10-CM | POA: Diagnosis not present

## 2021-08-21 DIAGNOSIS — D0462 Carcinoma in situ of skin of left upper limb, including shoulder: Secondary | ICD-10-CM | POA: Diagnosis not present

## 2021-09-01 DIAGNOSIS — H401131 Primary open-angle glaucoma, bilateral, mild stage: Secondary | ICD-10-CM | POA: Diagnosis not present

## 2021-10-04 DIAGNOSIS — L0291 Cutaneous abscess, unspecified: Secondary | ICD-10-CM | POA: Diagnosis not present

## 2021-11-28 DIAGNOSIS — Z03818 Encounter for observation for suspected exposure to other biological agents ruled out: Secondary | ICD-10-CM | POA: Diagnosis not present

## 2021-11-28 DIAGNOSIS — J069 Acute upper respiratory infection, unspecified: Secondary | ICD-10-CM | POA: Diagnosis not present

## 2021-12-04 DIAGNOSIS — E782 Mixed hyperlipidemia: Secondary | ICD-10-CM | POA: Diagnosis not present

## 2021-12-04 DIAGNOSIS — J4520 Mild intermittent asthma, uncomplicated: Secondary | ICD-10-CM | POA: Diagnosis not present

## 2021-12-04 DIAGNOSIS — I10 Essential (primary) hypertension: Secondary | ICD-10-CM | POA: Diagnosis not present

## 2021-12-04 DIAGNOSIS — Z0000 Encounter for general adult medical examination without abnormal findings: Secondary | ICD-10-CM | POA: Diagnosis not present

## 2021-12-04 DIAGNOSIS — K219 Gastro-esophageal reflux disease without esophagitis: Secondary | ICD-10-CM | POA: Diagnosis not present

## 2021-12-04 DIAGNOSIS — Z125 Encounter for screening for malignant neoplasm of prostate: Secondary | ICD-10-CM | POA: Diagnosis not present

## 2021-12-27 DIAGNOSIS — L821 Other seborrheic keratosis: Secondary | ICD-10-CM | POA: Diagnosis not present

## 2021-12-27 DIAGNOSIS — L578 Other skin changes due to chronic exposure to nonionizing radiation: Secondary | ICD-10-CM | POA: Diagnosis not present

## 2021-12-27 DIAGNOSIS — L814 Other melanin hyperpigmentation: Secondary | ICD-10-CM | POA: Diagnosis not present

## 2021-12-27 DIAGNOSIS — D225 Melanocytic nevi of trunk: Secondary | ICD-10-CM | POA: Diagnosis not present

## 2021-12-27 DIAGNOSIS — L570 Actinic keratosis: Secondary | ICD-10-CM | POA: Diagnosis not present

## 2022-01-24 DIAGNOSIS — L570 Actinic keratosis: Secondary | ICD-10-CM | POA: Diagnosis not present

## 2022-03-05 DIAGNOSIS — H401131 Primary open-angle glaucoma, bilateral, mild stage: Secondary | ICD-10-CM | POA: Diagnosis not present

## 2022-05-12 ENCOUNTER — Other Ambulatory Visit: Payer: Self-pay | Admitting: Cardiology

## 2022-06-04 DIAGNOSIS — Z23 Encounter for immunization: Secondary | ICD-10-CM | POA: Diagnosis not present

## 2022-06-04 DIAGNOSIS — R058 Other specified cough: Secondary | ICD-10-CM | POA: Diagnosis not present

## 2022-06-04 DIAGNOSIS — T464X5A Adverse effect of angiotensin-converting-enzyme inhibitors, initial encounter: Secondary | ICD-10-CM | POA: Diagnosis not present

## 2022-06-04 DIAGNOSIS — I10 Essential (primary) hypertension: Secondary | ICD-10-CM | POA: Diagnosis not present

## 2022-06-06 ENCOUNTER — Other Ambulatory Visit: Payer: Self-pay | Admitting: Cardiology

## 2022-06-15 DIAGNOSIS — Z09 Encounter for follow-up examination after completed treatment for conditions other than malignant neoplasm: Secondary | ICD-10-CM | POA: Diagnosis not present

## 2022-06-15 DIAGNOSIS — D122 Benign neoplasm of ascending colon: Secondary | ICD-10-CM | POA: Diagnosis not present

## 2022-06-15 DIAGNOSIS — Z86010 Personal history of colonic polyps: Secondary | ICD-10-CM | POA: Diagnosis not present

## 2022-06-15 DIAGNOSIS — D123 Benign neoplasm of transverse colon: Secondary | ICD-10-CM | POA: Diagnosis not present

## 2022-06-15 DIAGNOSIS — K648 Other hemorrhoids: Secondary | ICD-10-CM | POA: Diagnosis not present

## 2022-06-15 DIAGNOSIS — D125 Benign neoplasm of sigmoid colon: Secondary | ICD-10-CM | POA: Diagnosis not present

## 2022-06-15 DIAGNOSIS — K5730 Diverticulosis of large intestine without perforation or abscess without bleeding: Secondary | ICD-10-CM | POA: Diagnosis not present

## 2022-06-20 ENCOUNTER — Other Ambulatory Visit: Payer: Self-pay | Admitting: Cardiology

## 2022-07-19 ENCOUNTER — Ambulatory Visit: Payer: BC Managed Care – PPO | Attending: Cardiology | Admitting: Cardiology

## 2022-07-19 ENCOUNTER — Encounter: Payer: Self-pay | Admitting: Cardiology

## 2022-07-19 VITALS — BP 100/70 | HR 74 | Ht 69.0 in | Wt 256.0 lb

## 2022-07-19 DIAGNOSIS — Z8249 Family history of ischemic heart disease and other diseases of the circulatory system: Secondary | ICD-10-CM

## 2022-07-19 DIAGNOSIS — R931 Abnormal findings on diagnostic imaging of heart and coronary circulation: Secondary | ICD-10-CM

## 2022-07-19 DIAGNOSIS — I2510 Atherosclerotic heart disease of native coronary artery without angina pectoris: Secondary | ICD-10-CM

## 2022-07-19 NOTE — Patient Instructions (Signed)
Medication Instructions:  The current medical regimen is effective;  continue present plan and medications.  *If you need a refill on your cardiac medications before your next appointment, please call your pharmacy*  Follow-Up: At [facility], you and your health needs are our priority.  As part of our continuing mission to provide you with exceptional heart care, we have created designated Provider Care Teams.  These Care Teams include your primary Cardiologist (physician) and Advanced Practice Providers (APPs -  Physician Assistants and Nurse Practitioners) who all work together to provide you with the care you need, when you need it.  We recommend signing up for the patient portal called "MyChart".  Sign up information is provided on this After Visit Summary.  MyChart is used to connect with patients for Virtual Visits (Telemedicine).  Patients are able to view lab/test results, encounter notes, upcoming appointments, etc.  Non-urgent messages can be sent to your provider as well.   To learn more about what you can do with MyChart, go to [email or web address].    Your next appointment:   1 year(s)  The format for your next appointment:   In Person  Provider:   Mark Skains, MD      Important Information About Sugar       

## 2022-07-19 NOTE — Progress Notes (Signed)
Cardiology Office Note:    Date:  07/19/2022   ID:  [name], DOB 1966-08-10, MRN [id number]  PCP:  Lavone Orn, MD   [place]  Cardiologist:  Candee Furbish, MD  Advanced Practice Provider:  No care team member to display Electrophysiologist:  None       Referring MD: Lavone Orn, MD     History of Present Illness:    [name] is a 56 y.o. male here for follow-up of coronary artery disease.  LAD disease in the setting of severely elevated calcium score.  Upon review of cardiac catheterization, there was a small focal LAD lesion that does appear to be more significantly stenotic than 70%.  Distal to this lesion however the remainder of the LAD is quite diffusely diseased and small in size approximately 2 mm as reported.  Doing well this past year.  No anginal symptoms no significant changes.  No significant shortness of breath.  No fevers chills nausea vomiting syncope.  Past Medical History:  Diagnosis Date   Abnormal findings on diagnostic imaging of lung 05/20/2019   05/08/2019-CTA chest- no evidence of PE, bilateral airspace densities most consistent with multifocal pneumonia, cardiomegaly and advanced three-vessel coronary vascular calcification, mildly enlarged left hilar and subcarinal lymph nodes   Abnormal liver function 05/07/2019   Acute pain of left knee 06/05/2017   Adenomatous polyp    Asthma    Dr. Laurann Montana   CAD (coronary artery disease)    Chronic respiratory failure with hypoxia ([place]) 05/20/2019   Cough variant asthma vs recurrent upper airway cough syndrome 10/16/2013   post bronchitic or related to reflux -  07/26/2014 p extensive coaching HFA effectiveness =    90%  - Allergy profile 07/02/16  >  Eos 0.1 /  IgE 22  RAST neg     COVID-19 virus infection 05/07/2019   Elevated liver function tests    Elevated troponin 05/07/2019   Essential hypertension 07/06/2016   GERD (gastroesophageal reflux disease) 10/15/2016   Heart  murmur    Hemoptysis 03/12/2019   03/10/2019-CBC-hemoglobin 16.2  03/10/2019-CTA chest- examination of PE was limited by breath and motion, particularly in lung bases.  Within this limitation no evidence of PE through the proximal segmental pulmonary arterial level, CAD    Hepatic steatosis    Hiatal hernia    Hyperlipidemia    Hypertension    Hypertensive disorder 10/15/2016   Kidney stones    Low testosterone    Morbid obesity due to excess calories ([place]) 07/06/2016   Nephrolithiasis    Obesity    OSA (obstructive sleep apnea)    Rash 08/02/2014   Renal disorder    Rotator cuff disorder    Sinobronchitis 07/26/2014   Smokeless tobacco use 08/15/2018    Past Surgical History:  Procedure Laterality Date   LEFT HEART CATH AND CORONARY ANGIOGRAPHY N/A 01/25/2020   Procedure: LEFT HEART CATH AND CORONARY ANGIOGRAPHY;  Surgeon: Nelva Bush, MD;  Location: [facility];  Service: Cardiovascular;  Laterality: N/A;    Current Medications: Current Meds  Medication Sig   albuterol (PROAIR HFA) 108 (90 Base) MCG/ACT inhaler Inhale 2 puffs into the lungs every 6 (six) hours as needed for wheezing or shortness of breath.   aspirin 81 MG chewable tablet Chew 1 tablet (81 mg total) by mouth daily.   calcium carbonate (TUMS - DOSED IN MG ELEMENTAL CALCIUM) 500 MG chewable tablet Chew 2 tablets by mouth daily as needed  for indigestion or heartburn.   esomeprazole (NEXIUM) 40 MG capsule Take 40 mg by mouth See admin instructions. Take 40 mg daily, may take a second dose as needed for acid reflux   latanoprost (XALATAN) 0.005 % ophthalmic solution Place 1 drop into both eyes at bedtime.    losartan-hydrochlorothiazide (HYZAAR) 50-12.5 MG tablet Take 1 tablet by mouth daily.   Multiple Vitamin (MULTIVITAMIN) tablet Take 1 tablet by mouth daily.   rosuvastatin (CRESTOR) 20 MG tablet Take 1 tablet (20 mg total) by mouth daily.   sodium chloride (OCEAN) 0.65 % SOLN nasal spray Place 1 spray into  both nostrils as needed for congestion.   zinc gluconate 50 MG tablet Take 50 mg by mouth daily.     Allergies:   Clindamycin/lincomycin, Lisinopril, Neosporin [neomycin-bacitracin zn-polymyx], Penicillins, and Tramadol hcl   Social History   Socioeconomic History   Marital status: Married    Spouse name: Not on file   Number of children: 0   Years of education: Not on file   Highest education level: Not on file  Occupational History   Occupation: install door frames    Employer: [employer]  Tobacco Use   Smoking status: Former    Packs/day: 1.00    Years: 1.00    Total pack years: 1.00    Types: Cigarettes    Quit date: 08/14/1983    Years since quitting: 38.9   Smokeless tobacco: Current    Types: Chew   Tobacco comments:    dip  Substance and Sexual Activity   Alcohol use: Yes    Alcohol/week: 0.0 standard drinks of alcohol    Comment: occ.   Drug use: No   Sexual activity: Not on file  Other Topics Concern   Not on file  Social History Narrative   Not on file   Social Determinants of Health   Financial Resource Strain: Not on file  Food Insecurity: Not on file  Transportation Needs: Not on file  Physical Activity: Not on file  Stress: Not on file  Social Connections: Not on file     Family History: The patient's family history includes CAD in his father, paternal grandfather, and paternal grandmother; Cancer - Colon in his maternal aunt, maternal grandfather, and maternal grandmother; Colon cancer (age of onset: 56) in his mother; Diabetes in his father; GER disease in his mother; Heart disease (age of onset: 29) in his father; Heart failure (age of onset: 60) in his father; Hypertension in his mother; Transient ischemic attack (age of onset: 61) in his mother.  ROS:   Please see the history of present illness.     All other systems reviewed and are negative.  EKGs/Labs/Other Studies Reviewed:    The following studies were reviewed  today:  Cath 2021: Diagnostic Dominance: Right  Calcium score 11/24/2019: The observed calcium score of 4921 is at the percentile 99 for subjects of the same age, gender and race/ethnicity who are free of clinical cardiovascular disease and treated diabetes.  EKG:  EKG is  ordered today.  The ekg ordered today demonstrates sinus rhythm first-degree AV block heart rate 74 bpm with PR interval of 210 ms inferior infarct pattern anterior lateral infarct pattern.  Prior sinus rhythm PVC 66 bpm  Recent Labs: No results found for requested labs within last 365 days.  Recent Lipid Panel    Component Value Date/Time   CHOL 112 05/02/2020 0739   TRIG 104 05/02/2020 0739   HDL 36 (L) 05/02/2020  0739   CHOLHDL 3.1 05/02/2020 0739   CHOLHDL 6.1 01/05/2011 0600   VLDL 32 01/05/2011 0600   LDLCALC 56 05/02/2020 0739     Risk Assessment/Calculations:      Physical Exam:    VS:  BP 100/70 (BP Location: Left Arm, Patient Position: Sitting, Cuff Size: Normal)   Pulse 74   Ht '5\' 9"'$  (1.753 m)   Wt 256 lb (116.1 kg)   BMI 37.80 kg/m     Wt Readings from Last 3 Encounters:  07/19/22 256 lb (116.1 kg)  04/24/21 253 lb (114.8 kg)  01/07/21 249 lb (112.9 kg)     GEN: Well nourished, well developed, in no acute distress HEENT: normal Neck: no JVD, carotid bruits, or masses Cardiac: RRR; no murmurs, rubs, or gallops,no edema  Respiratory:  clear to auscultation bilaterally, normal work of breathing GI: soft, nontender, nondistended, + BS MS: no deformity or atrophy Skin: warm and dry, no rash Neuro:  Alert and Oriented x 3, Strength and sensation are intact Psych: euthymic mood, full affect   ASSESSMENT:    1. Coronary artery disease involving native coronary artery of native heart without angina pectoris   2. Family history of coronary artery disease   3. Elevated coronary artery calcium score     PLAN:    In order of problems listed above:  Coronary artery  disease -Cardiac catheterization performed on 01/25/2020-demonstrated moderate to severe single-vessel coronary disease with focal 70% mid LAD stenosis just beyond the takeoff of diagonal 2.  Reviewing cardiac catheterization, there are some areas/views where the LAD disease focally seems more significant.  The distal LAD is relatively small in caliber and likely diffusely diseased.  He also had mild to moderate nonobstructive CAD involving the left main coronary artery left circumflex as well as right coronary artery.   Filling pressures were mildly elevated at 20-25. -Stable, no significant symptoms.  Continue with aspirin which he takes 5 days and then holds on the weekend.  Sometimes during his job he is getting cut.  This is fine.  Hyperlipidemia -We increased his Crestor to 20 mg once a day.  High intensity statin dose.  Doing well without any myalgias. -Prior LDL was 122.  He did feel some mild joint aches in his shoulders when for starting Crestor.  This had resolved.  Most recent LDL 41.  Excellent job.  No myalgias.  Family history of CAD -Mother had coronary disease post stenting --Father 68 MI. CABG in [place].  Prior Covid infection -Was at [facility].  GERD -Nexium.  Food diary.  Continue to work on losing weight.  He is up about 10 to 15 pounds.  Discussed today.  12 month follow up        Medication Adjustments/Labs and Tests Ordered: Current medicines are reviewed at length with the patient today.  Concerns regarding medicines are outlined above.  Orders Placed This Encounter  Procedures   EKG 12-Lead   No orders of the defined types were placed in this encounter.   Patient Instructions  Medication Instructions:  The current medical regimen is effective;  continue present plan and medications.  *If you need a refill on your cardiac medications before your next appointment, please call your pharmacy*  Follow-Up: At [facility],  you and your health needs are our priority.  As part of our continuing mission to provide you with exceptional heart care, we have created designated Provider Care Teams.  These Care Teams include  your primary Cardiologist (physician) and Advanced Practice Providers (APPs -  Physician Assistants and Nurse Practitioners) who all work together to provide you with the care you need, when you need it.  We recommend signing up for the patient portal called "MyChart".  Sign up information is provided on this After Visit Summary.  MyChart is used to connect with patients for Virtual Visits (Telemedicine).  Patients are able to view lab/test results, encounter notes, upcoming appointments, etc.  Non-urgent messages can be sent to your provider as well.   To learn more about what you can do with MyChart, go to [email or web address].    Your next appointment:   1 year(s)  The format for your next appointment:   In Person  Provider:   Candee Furbish, MD      Important Information About Sugar         Signed, Candee Furbish, MD  07/19/2022 3:39 PM    [place]

## 2022-08-29 DIAGNOSIS — H401131 Primary open-angle glaucoma, bilateral, mild stage: Secondary | ICD-10-CM | POA: Diagnosis not present

## 2022-10-09 ENCOUNTER — Other Ambulatory Visit: Payer: Self-pay | Admitting: Cardiology

## 2023-01-09 DIAGNOSIS — J019 Acute sinusitis, unspecified: Secondary | ICD-10-CM | POA: Diagnosis not present

## 2023-01-09 DIAGNOSIS — J029 Acute pharyngitis, unspecified: Secondary | ICD-10-CM | POA: Diagnosis not present

## 2023-01-09 DIAGNOSIS — J4520 Mild intermittent asthma, uncomplicated: Secondary | ICD-10-CM | POA: Diagnosis not present

## 2023-01-09 DIAGNOSIS — J302 Other seasonal allergic rhinitis: Secondary | ICD-10-CM | POA: Diagnosis not present

## 2023-02-27 DIAGNOSIS — H401131 Primary open-angle glaucoma, bilateral, mild stage: Secondary | ICD-10-CM | POA: Diagnosis not present

## 2023-03-06 DIAGNOSIS — D485 Neoplasm of uncertain behavior of skin: Secondary | ICD-10-CM | POA: Diagnosis not present

## 2023-03-06 DIAGNOSIS — L570 Actinic keratosis: Secondary | ICD-10-CM | POA: Diagnosis not present

## 2023-03-06 DIAGNOSIS — Z85828 Personal history of other malignant neoplasm of skin: Secondary | ICD-10-CM | POA: Diagnosis not present

## 2023-03-06 DIAGNOSIS — L821 Other seborrheic keratosis: Secondary | ICD-10-CM | POA: Diagnosis not present

## 2023-03-06 DIAGNOSIS — D225 Melanocytic nevi of trunk: Secondary | ICD-10-CM | POA: Diagnosis not present

## 2023-03-06 DIAGNOSIS — L814 Other melanin hyperpigmentation: Secondary | ICD-10-CM | POA: Diagnosis not present

## 2023-03-06 DIAGNOSIS — D0461 Carcinoma in situ of skin of right upper limb, including shoulder: Secondary | ICD-10-CM | POA: Diagnosis not present

## 2023-03-21 DIAGNOSIS — L570 Actinic keratosis: Secondary | ICD-10-CM | POA: Diagnosis not present

## 2023-03-21 DIAGNOSIS — D0461 Carcinoma in situ of skin of right upper limb, including shoulder: Secondary | ICD-10-CM | POA: Diagnosis not present

## 2023-04-09 DIAGNOSIS — I10 Essential (primary) hypertension: Secondary | ICD-10-CM | POA: Diagnosis not present

## 2023-04-09 DIAGNOSIS — Z0000 Encounter for general adult medical examination without abnormal findings: Secondary | ICD-10-CM | POA: Diagnosis not present

## 2023-04-09 DIAGNOSIS — Z125 Encounter for screening for malignant neoplasm of prostate: Secondary | ICD-10-CM | POA: Diagnosis not present

## 2023-04-09 DIAGNOSIS — E782 Mixed hyperlipidemia: Secondary | ICD-10-CM | POA: Diagnosis not present

## 2023-04-09 DIAGNOSIS — J4520 Mild intermittent asthma, uncomplicated: Secondary | ICD-10-CM | POA: Diagnosis not present

## 2023-06-19 DIAGNOSIS — M546 Pain in thoracic spine: Secondary | ICD-10-CM | POA: Diagnosis not present

## 2023-06-25 DIAGNOSIS — R0782 Intercostal pain: Secondary | ICD-10-CM | POA: Diagnosis not present

## 2023-06-25 DIAGNOSIS — M546 Pain in thoracic spine: Secondary | ICD-10-CM | POA: Diagnosis not present

## 2023-07-09 DIAGNOSIS — R0782 Intercostal pain: Secondary | ICD-10-CM | POA: Diagnosis not present

## 2023-07-09 DIAGNOSIS — M546 Pain in thoracic spine: Secondary | ICD-10-CM | POA: Diagnosis not present

## 2023-07-24 DIAGNOSIS — R0782 Intercostal pain: Secondary | ICD-10-CM | POA: Diagnosis not present

## 2023-07-24 DIAGNOSIS — M546 Pain in thoracic spine: Secondary | ICD-10-CM | POA: Diagnosis not present

## 2023-08-12 DIAGNOSIS — R0782 Intercostal pain: Secondary | ICD-10-CM | POA: Diagnosis not present

## 2023-08-12 DIAGNOSIS — M546 Pain in thoracic spine: Secondary | ICD-10-CM | POA: Diagnosis not present

## 2023-08-24 DIAGNOSIS — J069 Acute upper respiratory infection, unspecified: Secondary | ICD-10-CM | POA: Diagnosis not present

## 2023-08-26 DIAGNOSIS — H6690 Otitis media, unspecified, unspecified ear: Secondary | ICD-10-CM | POA: Diagnosis not present

## 2023-08-26 DIAGNOSIS — R059 Cough, unspecified: Secondary | ICD-10-CM | POA: Diagnosis not present

## 2023-08-26 DIAGNOSIS — J101 Influenza due to other identified influenza virus with other respiratory manifestations: Secondary | ICD-10-CM | POA: Diagnosis not present

## 2023-08-26 DIAGNOSIS — Z03818 Encounter for observation for suspected exposure to other biological agents ruled out: Secondary | ICD-10-CM | POA: Diagnosis not present

## 2023-09-02 DIAGNOSIS — H401131 Primary open-angle glaucoma, bilateral, mild stage: Secondary | ICD-10-CM | POA: Diagnosis not present

## 2023-10-22 DIAGNOSIS — I10 Essential (primary) hypertension: Secondary | ICD-10-CM | POA: Diagnosis not present

## 2023-10-22 DIAGNOSIS — I2510 Atherosclerotic heart disease of native coronary artery without angina pectoris: Secondary | ICD-10-CM | POA: Diagnosis not present

## 2023-10-22 DIAGNOSIS — J4520 Mild intermittent asthma, uncomplicated: Secondary | ICD-10-CM | POA: Diagnosis not present

## 2023-10-22 DIAGNOSIS — G4733 Obstructive sleep apnea (adult) (pediatric): Secondary | ICD-10-CM | POA: Diagnosis not present

## 2024-01-02 DIAGNOSIS — H401131 Primary open-angle glaucoma, bilateral, mild stage: Secondary | ICD-10-CM | POA: Diagnosis not present

## 2024-01-30 DIAGNOSIS — J302 Other seasonal allergic rhinitis: Secondary | ICD-10-CM | POA: Diagnosis not present

## 2024-01-30 DIAGNOSIS — J0100 Acute maxillary sinusitis, unspecified: Secondary | ICD-10-CM | POA: Diagnosis not present

## 2024-02-28 ENCOUNTER — Encounter: Payer: Self-pay | Admitting: Advanced Practice Midwife

## 2024-04-15 DIAGNOSIS — I2510 Atherosclerotic heart disease of native coronary artery without angina pectoris: Secondary | ICD-10-CM | POA: Diagnosis not present

## 2024-04-15 DIAGNOSIS — J302 Other seasonal allergic rhinitis: Secondary | ICD-10-CM | POA: Diagnosis not present

## 2024-04-15 DIAGNOSIS — K219 Gastro-esophageal reflux disease without esophagitis: Secondary | ICD-10-CM | POA: Diagnosis not present

## 2024-04-15 DIAGNOSIS — I10 Essential (primary) hypertension: Secondary | ICD-10-CM | POA: Diagnosis not present

## 2024-04-15 DIAGNOSIS — J4520 Mild intermittent asthma, uncomplicated: Secondary | ICD-10-CM | POA: Diagnosis not present

## 2024-04-15 DIAGNOSIS — Z13220 Encounter for screening for lipoid disorders: Secondary | ICD-10-CM | POA: Diagnosis not present

## 2024-04-15 DIAGNOSIS — Z23 Encounter for immunization: Secondary | ICD-10-CM | POA: Diagnosis not present

## 2024-04-15 DIAGNOSIS — Z0000 Encounter for general adult medical examination without abnormal findings: Secondary | ICD-10-CM | POA: Diagnosis not present

## 2024-04-16 DIAGNOSIS — H401132 Primary open-angle glaucoma, bilateral, moderate stage: Secondary | ICD-10-CM | POA: Diagnosis not present

## 2024-04-30 DIAGNOSIS — H401121 Primary open-angle glaucoma, left eye, mild stage: Secondary | ICD-10-CM | POA: Diagnosis not present

## 2024-06-02 DIAGNOSIS — D0461 Carcinoma in situ of skin of right upper limb, including shoulder: Secondary | ICD-10-CM | POA: Diagnosis not present

## 2024-06-02 DIAGNOSIS — D485 Neoplasm of uncertain behavior of skin: Secondary | ICD-10-CM | POA: Diagnosis not present

## 2024-06-02 DIAGNOSIS — L814 Other melanin hyperpigmentation: Secondary | ICD-10-CM | POA: Diagnosis not present

## 2024-06-02 DIAGNOSIS — D0462 Carcinoma in situ of skin of left upper limb, including shoulder: Secondary | ICD-10-CM | POA: Diagnosis not present

## 2024-06-02 DIAGNOSIS — Z85828 Personal history of other malignant neoplasm of skin: Secondary | ICD-10-CM | POA: Diagnosis not present

## 2024-06-02 DIAGNOSIS — L821 Other seborrheic keratosis: Secondary | ICD-10-CM | POA: Diagnosis not present

## 2024-06-02 DIAGNOSIS — D225 Melanocytic nevi of trunk: Secondary | ICD-10-CM | POA: Diagnosis not present

## 2024-06-02 DIAGNOSIS — L570 Actinic keratosis: Secondary | ICD-10-CM | POA: Diagnosis not present

## 2024-06-11 DIAGNOSIS — H401131 Primary open-angle glaucoma, bilateral, mild stage: Secondary | ICD-10-CM | POA: Diagnosis not present

## 2024-06-24 DIAGNOSIS — L570 Actinic keratosis: Secondary | ICD-10-CM | POA: Diagnosis not present

## 2024-06-24 DIAGNOSIS — C44621 Squamous cell carcinoma of skin of unspecified upper limb, including shoulder: Secondary | ICD-10-CM | POA: Diagnosis not present

## 2024-07-06 DIAGNOSIS — J019 Acute sinusitis, unspecified: Secondary | ICD-10-CM | POA: Diagnosis not present

## 2024-07-14 DIAGNOSIS — C44629 Squamous cell carcinoma of skin of left upper limb, including shoulder: Secondary | ICD-10-CM | POA: Diagnosis not present
